# Patient Record
Sex: Female | Born: 1945 | Race: White | Hispanic: No | Smoking: Never smoker
Health system: Southern US, Community
[De-identification: ages and names within clinical notes are randomized; demographics above are authoritative.]

## PROBLEM LIST (undated history)

## (undated) DIAGNOSIS — F319 Bipolar disorder, unspecified: Secondary | ICD-10-CM

## (undated) DIAGNOSIS — M1990 Unspecified osteoarthritis, unspecified site: Secondary | ICD-10-CM

## (undated) DIAGNOSIS — I10 Essential (primary) hypertension: Secondary | ICD-10-CM

## (undated) DIAGNOSIS — M810 Age-related osteoporosis without current pathological fracture: Secondary | ICD-10-CM

## (undated) HISTORY — DX: Essential (primary) hypertension: I10

## (undated) HISTORY — DX: Bipolar disorder, unspecified: F31.9

## (undated) HISTORY — DX: Unspecified osteoarthritis, unspecified site: M19.90

## (undated) HISTORY — PX: FRACTURE SURGERY: SHX138

---

## 2013-03-19 ENCOUNTER — Emergency Department (HOSPITAL_COMMUNITY)
Admission: EM | Admit: 2013-03-19 | Discharge: 2013-03-19 | Disposition: A | Discharge status: Home / Self Care | Payer: Medicare HMO | Attending: Emergency Medicine | Admitting: Emergency Medicine

## 2013-03-19 ENCOUNTER — Encounter (HOSPITAL_COMMUNITY): Payer: Self-pay | Admitting: *Deleted

## 2013-03-19 ENCOUNTER — Emergency Department (HOSPITAL_COMMUNITY): Payer: Medicare HMO

## 2013-03-19 DIAGNOSIS — Z8739 Personal history of other diseases of the musculoskeletal system and connective tissue: Secondary | ICD-10-CM | POA: Insufficient documentation

## 2013-03-19 DIAGNOSIS — M25552 Pain in left hip: Secondary | ICD-10-CM

## 2013-03-19 DIAGNOSIS — M25559 Pain in unspecified hip: Secondary | ICD-10-CM | POA: Insufficient documentation

## 2013-03-19 HISTORY — DX: Age-related osteoporosis without current pathological fracture: M81.0

## 2013-03-19 NOTE — ED Notes (Signed)
Pt states she fell in July and she has had continued pain ever since.

## 2013-03-19 NOTE — ED Provider Notes (Signed)
CSN: [id number]     Arrival date & time 03/19/13  0620 History   First MD Initiated Contact with Patient 03/19/13 0700     Chief Complaint  Patient presents with  . Groin Pain   (Consider location/radiation/quality/duration/timing/severity/associated sxs/prior Treatment) Patient is a 67 y.o. female presenting with groin pain. The history is provided by the patient.  Groin Pain This is a new problem. Episode onset: 2-3 weeks ago. The problem occurs constantly. The problem has not changed since onset.Pertinent negatives include no abdominal pain. The symptoms are aggravated by walking (and sitting). The symptoms are relieved by rest. She has tried rest for the symptoms. The treatment provided mild relief.    Past Medical History  Diagnosis Date  . Osteoporosis    No past surgical history on file. No family history on file. History  Substance Use Topics  . Smoking status: Never Smoker   . Smokeless tobacco: Not on file  . Alcohol Use: No   OB History   Grav Para Term Preterm Abortions TAB SAB Ect Mult Living                 Review of Systems  Constitutional: Negative for fever.  Gastrointestinal: Negative for vomiting and abdominal pain.  Genitourinary: Negative for dysuria and hematuria.  Musculoskeletal: Negative for joint swelling.  Skin: Negative for color change and wound.  Neurological: Negative for weakness and numbness.  All other systems reviewed and are negative.    Allergies  Review of patient's allergies indicates no known allergies.  Home Medications  No current outpatient prescriptions on file. BP 111/96  Pulse 67  Temp(Src) 97.7 F (36.5 C) (Oral)  Resp 20  SpO2 98% Physical Exam  Nursing note and vitals reviewed. Constitutional: She is oriented to person, place, and time. She appears well-developed and well-nourished. No distress.  HENT:  Head: Normocephalic and atraumatic.  Right Ear: External ear normal.  Left Ear: External ear normal.  Nose:  Nose normal.  Eyes: Right eye exhibits no discharge. Left eye exhibits no discharge.  Cardiovascular: Normal rate, regular rhythm and normal heart sounds.   Pulses:      Dorsalis pedis pulses are 2+ on the right side, and 2+ on the left side.  Pulmonary/Chest: Effort normal and breath sounds normal.  Abdominal: Soft. There is no tenderness.  Musculoskeletal:       Left hip: She exhibits tenderness.       Lumbar back: She exhibits no tenderness and no bony tenderness.  Mild pain with ROM of left hip. Rest of MSK exam is neg.  Neurological: She is alert and oriented to person, place, and time. She has normal strength. No sensory deficit.  Skin: Skin is warm and dry.    ED Course  Procedures (including critical care time) Labs Review Labs Reviewed - No data to display Imaging Review Dg Hip Complete Left  03/19/2013   *RADIOLOGY REPORT*  Clinical Data: Left hip pain.  LEFT HIP - COMPLETE 2+ VIEW  Comparison: 12/29/2011 radiographs  Findings: Right hip replacement noted. There is no evidence of acute fracture, subluxation or dislocation. The left hip joint space is unremarkable. No focal bony lesions are present.  IMPRESSION: No acute abnormalities.  Right hip replacement.   Original Report Authenticated By: Harmon Pier, M.D.    MDM   1. Left hip pain     67 year old female presents with gradual two-week history of left groin and back pain. Tarted same time. Only hurts with range of motion  of her hip. No neurologic symptoms. Has a normal neurovascular exam. No urinary or abdominal symptoms. She has had a right hip replaced and this feels similar but less intense. She describes as a 4/10 pain denies wanting any pain meds here. Her main concern is that is broken, x-ray shows no fractures. Is able to ambulate in the ED. Will refer to orthopedics as an outpatient.     Audree Camel, MD 03/19/13 [phone]

## 2014-03-11 IMAGING — CR DG HIP (WITH OR WITHOUT PELVIS) 2-3V*L*
3 series · 3 of 3 positions shown · non-contrast
Comparison: 12/29/2011 radiographs

CLINICAL DATA: Left hip pain.

LEFT HIP - COMPLETE 2+ VIEW

[t pelvis ap]
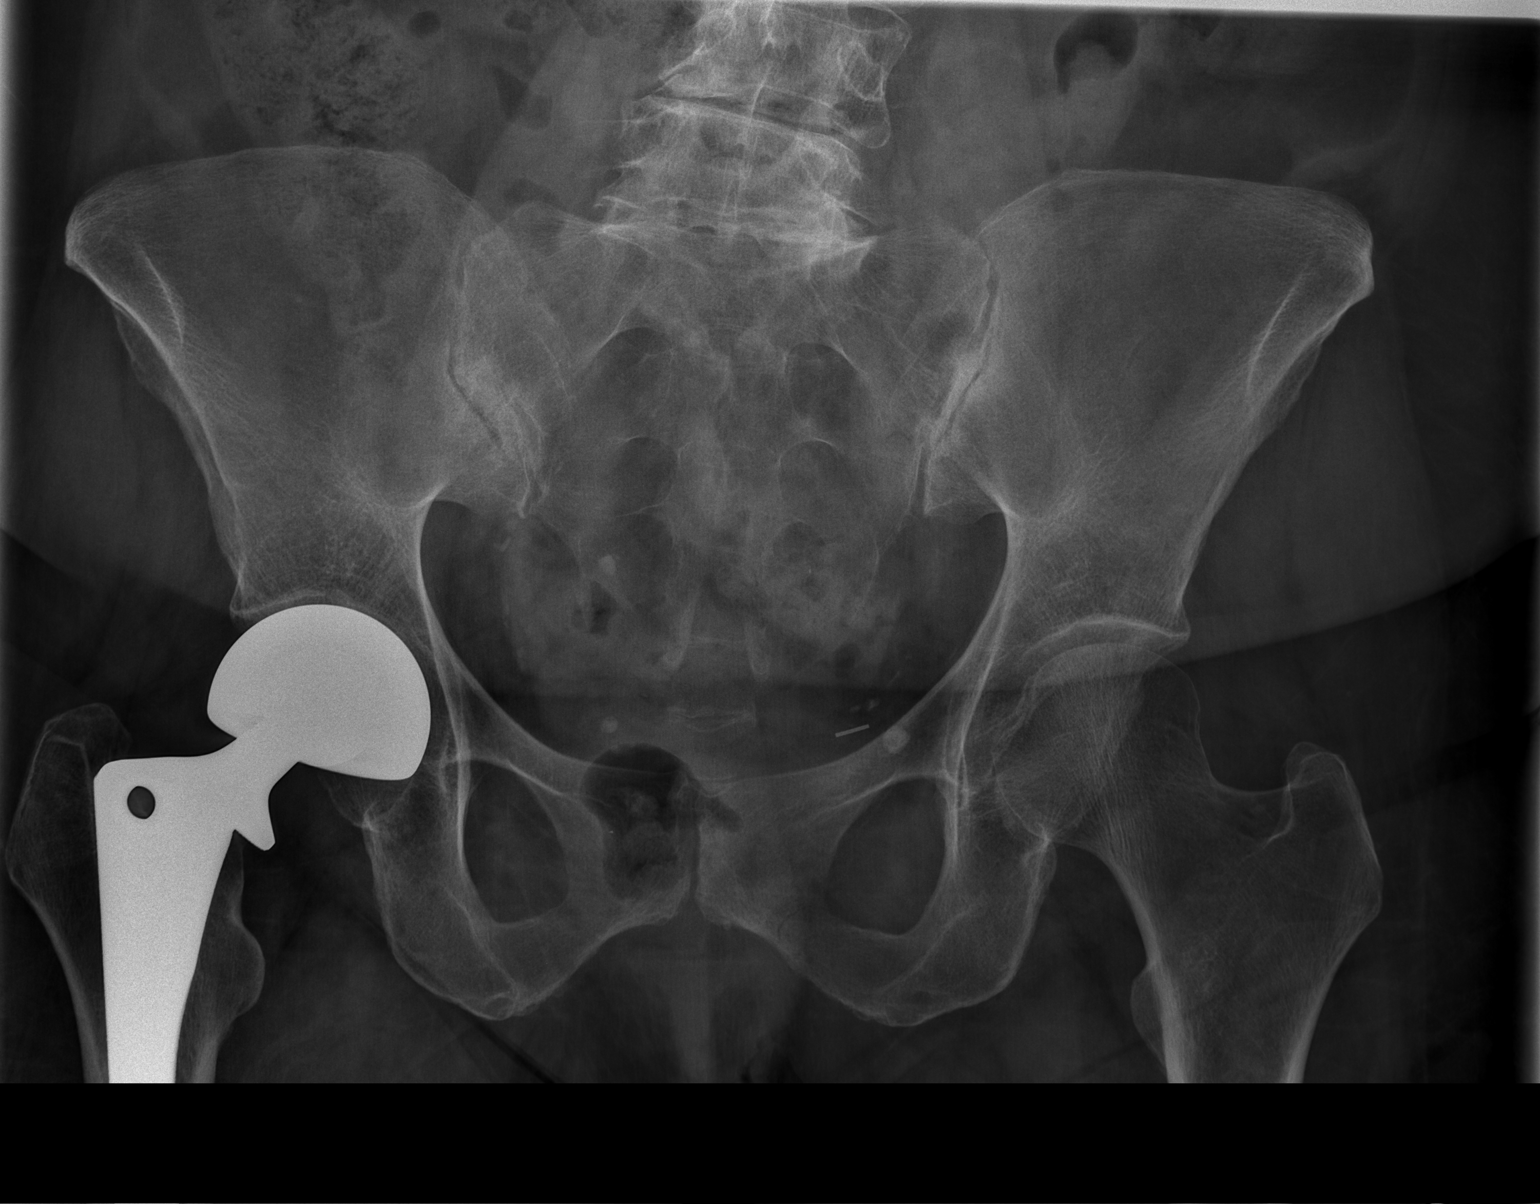

[t hip ap left]
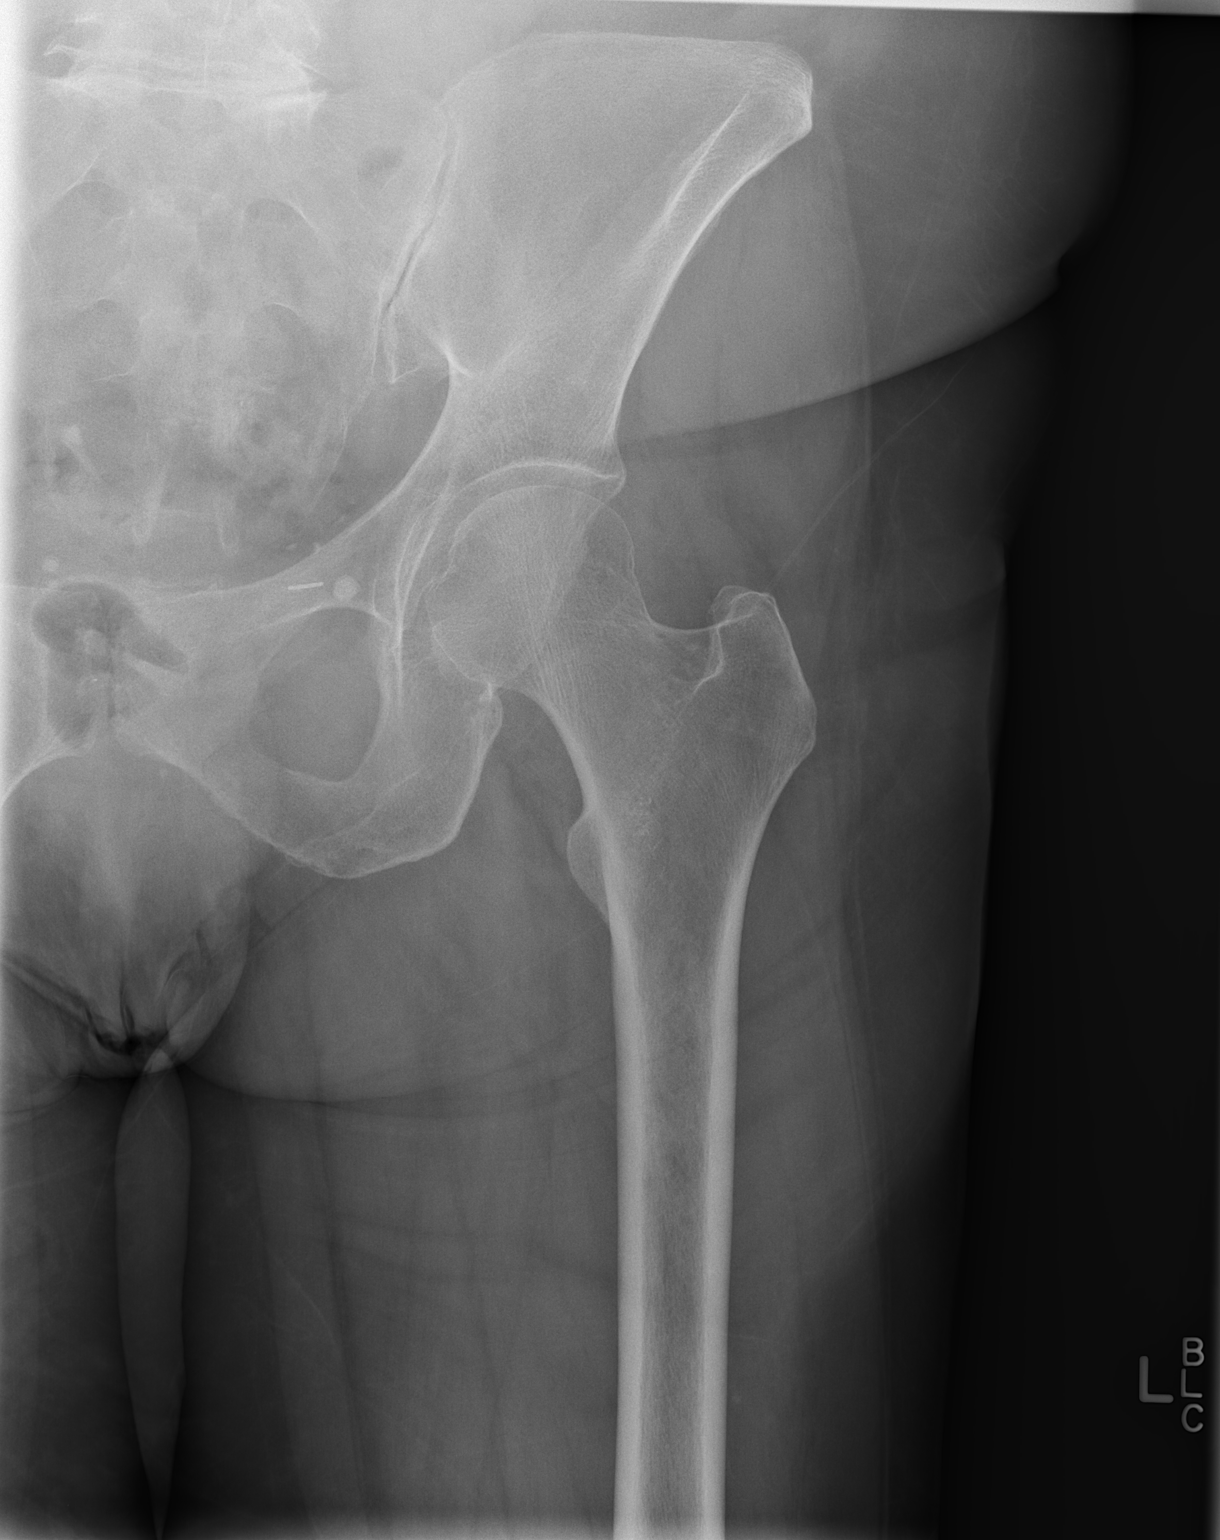

[t hip frog leg left]
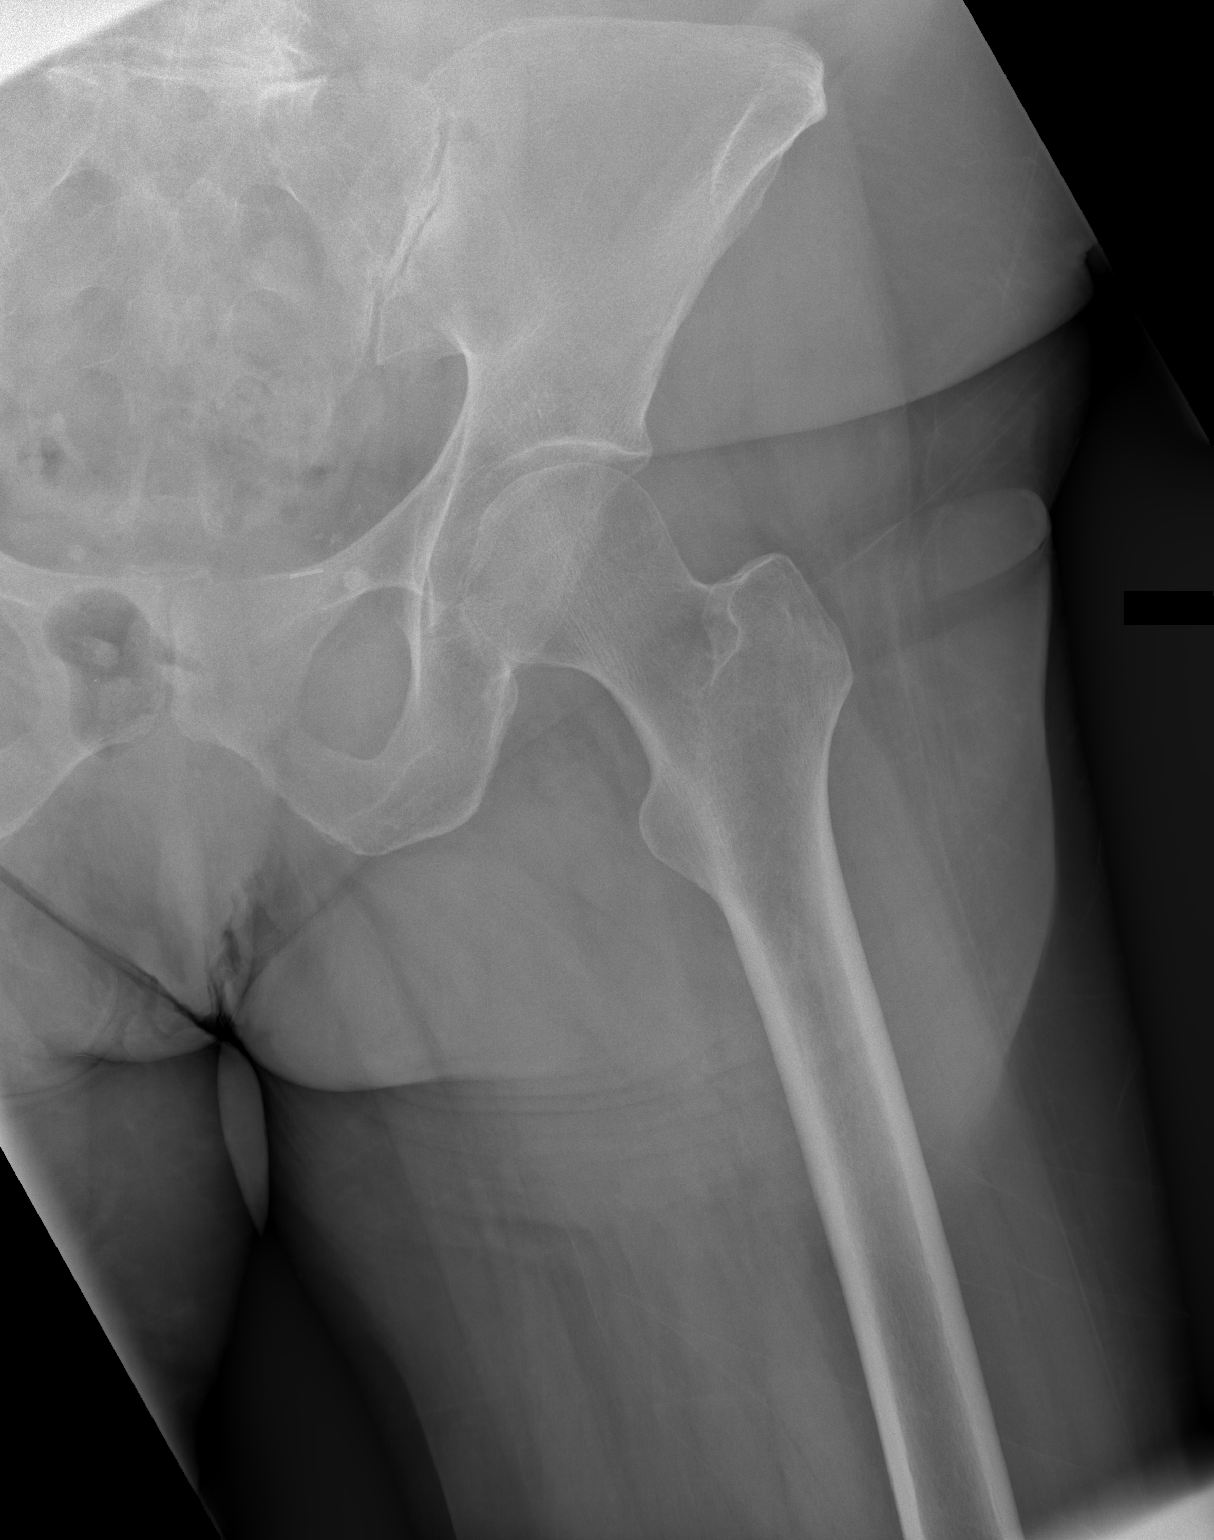

[3 of 3 positions shown; findings below may reference images not displayed]

FINDINGS: Right hip replacement noted.
There is no evidence of acute fracture, subluxation or dislocation.
The left hip joint space is unremarkable.
No focal bony lesions are present.
IMPRESSION: No acute abnormalities.

Right hip replacement.

## 2014-06-01 ENCOUNTER — Ambulatory Visit (INDEPENDENT_AMBULATORY_CARE_PROVIDER_SITE_OTHER): Payer: Commercial Managed Care - HMO

## 2014-06-01 VITALS — BP 146/88 | HR 69 | Resp 12

## 2014-06-01 DIAGNOSIS — B351 Tinea unguium: Secondary | ICD-10-CM

## 2014-06-01 DIAGNOSIS — S90121S Contusion of right lesser toe(s) without damage to nail, sequela: Secondary | ICD-10-CM

## 2014-06-01 DIAGNOSIS — M79674 Pain in right toe(s): Secondary | ICD-10-CM

## 2014-06-01 MED ORDER — EFINACONAZOLE 10 % EX SOLN: CUTANEOUS | Status: DC

## 2014-06-01 NOTE — Patient Instructions (Signed)

## 2014-06-01 NOTE — Progress Notes (Signed)
   Subjective:    Patient ID: Emily Blair, female    DOB: 1946/01/27, 68 y.o.   MRN: [id number]  HPI PT STATED RT FOOT GREAT TOENAIL HAVE DISCOLORATION BUT DOES NOT HURT  FOR 3 WEEKS. THE TOENAILS IS LOOKING WORSE AND TRIED NO TREATMENT.   Review of Systems  All other systems reviewed and are negative.      Objective:   Physical Exam 68 year old white female well-developed well-nourished oriented 3 presents at this time with a dark discolored black right hallux nail with separation from the nailbed the left hallux also systems yellowing and thickening although not as severe. No history of acute injury or trauma the patient recall however it some point may have developed trauma there is separation from the nailbed for nail plate at this time the nail plate is debrided and normal skin completely avulsed slight tenderness of the proximal nail bed is identified following debridement and evaluation. Does not appear be melanoma the pigmentation is within the nail plate which is debrided away. At this time findings are consistent with onychomycosis rather than melanoma. Remainder of exam unremarkable pedal pulses are palpable DP and PT +2 over 4 capillary refill time 3 seconds epicritic and proprioceptive sensations intact and symmetric bilateral there is normal plantar response DTRs not leaving not listed dermatologic the skin color pigment normal hair growth absent there is thickening discoloration darkening both hallux nails right seems worse than left with lysis of the right hallux nail plate being noted. Remainder exam unremarkable no open wounds no ulcers no secondary infections       Assessment & Plan:  Assessment this time onychomycosis right hallux nail plate nailbed plan this time the nails debrided down to the proximal nail fold slight tenderness is noted no open wounds no secondary infections this time her patient request patient is placed empirically on topical antifungal suggested Jublia. A  prescription is provided as well as a coupon card for discount patient will apply once daily to the affected area for 12 months duration as instructed also wash the nail soap and water literature on mycotic nails also dispensed for other care. At this time maintain appropriate accommodative shoes recheck in 6 months for follow-up and reevaluation or reassessment if needed in the future.   Alvan Dameichard Karsyn Jamie DPM

## 2014-06-07 ENCOUNTER — Other Ambulatory Visit: Payer: Self-pay | Admitting: *Deleted

## 2014-06-07 NOTE — Telephone Encounter (Signed)
Insurance would not cover the Jublia.  Dr Ralene CorkSikora changed the prescription to Penlac Nail Lacquer, apply to affected nails every day.  Remove every 7 days with alcohol.  6.6 ml, 5 refills

## 2014-12-14 ENCOUNTER — Ambulatory Visit (INDEPENDENT_AMBULATORY_CARE_PROVIDER_SITE_OTHER): Payer: Commercial Managed Care - HMO | Admitting: Podiatrist

## 2014-12-14 ENCOUNTER — Encounter: Payer: Self-pay | Admitting: Podiatrist

## 2014-12-14 VITALS — BP 135/86 | HR 70 | Resp 18

## 2014-12-14 DIAGNOSIS — B351 Tinea unguium: Secondary | ICD-10-CM | POA: Diagnosis not present

## 2014-12-14 MED ORDER — EFINACONAZOLE 10 % EX SOLN: CUTANEOUS | Status: DC

## 2014-12-15 NOTE — Progress Notes (Signed)
Chief Complaint  Patient presents with  . Nail Problem    i am here to follow back up on my two big toenails and i am still using jublia and i am almost out     HPI: Patient is 69 y.o. female who presents today for follow up of onychomycosis of bilateral great toenails after using jublia. She has noticed improvement   No Known Allergies  Physical Exam  Patient is awake, alert, and oriented x 3.  In no acute distress.  Vascular status is intact with palpable pedal pulses at 2/4 DP and PT bilateral and capillary refill time within normal limits. Neurological sensation is also intact bilaterally via Semmes Weinstein monofilament at 5/5 sites. Light touch, vibratory sensation, Achilles tendon reflex is intact. Dermatological exam reveals skin color, turger and texture as normal. No open lesions present.  Musculature intact with dorsiflexion, plantarflexion, inversion, eversion.  Improved appearance of new nail growth is noted.  Clearing of the proximal nail is seen clinically.    Assessment: onychomycosis of nail  Plan: recommended continued use of jublia for the next 12 months. New rx dispensed for [facility] and coupon given. She will call if the nail fails to improve.

## 2014-12-25 ENCOUNTER — Telehealth: Payer: Self-pay | Admitting: *Deleted

## 2014-12-25 NOTE — Telephone Encounter (Signed)
Faxed request for Jublia from [facility] [address] Denied due to no current evaluation office visit.

## 2016-05-28 ENCOUNTER — Encounter: Payer: Self-pay | Admitting: Sports Medicine

## 2016-05-28 ENCOUNTER — Ambulatory Visit: Payer: Commercial Managed Care - HMO | Admitting: Sports Medicine

## 2016-05-28 ENCOUNTER — Ambulatory Visit (INDEPENDENT_AMBULATORY_CARE_PROVIDER_SITE_OTHER): Payer: Medicare HMO | Admitting: Sports Medicine

## 2016-05-28 DIAGNOSIS — M79674 Pain in right toe(s): Secondary | ICD-10-CM | POA: Diagnosis not present

## 2016-05-28 DIAGNOSIS — L600 Ingrowing nail: Secondary | ICD-10-CM | POA: Diagnosis not present

## 2016-05-28 DIAGNOSIS — M79675 Pain in left toe(s): Secondary | ICD-10-CM

## 2016-05-28 DIAGNOSIS — L603 Nail dystrophy: Secondary | ICD-10-CM

## 2016-05-28 DIAGNOSIS — B351 Tinea unguium: Secondary | ICD-10-CM

## 2016-05-28 NOTE — Progress Notes (Signed)
Subjective: Dorthula PerfectSue D Gotcher is a 70 y.o. female patient presents to office today complaining of a painful thickened big toenails desiring removal and treatment. States that she has tried CongoJub[place] in the past with no improvement.  Patient denies fever/chills/nausea/vomitting/any other related constitutional symptoms at this time.  There are no active problems to display for this patient.   Current Outpatient Prescriptions on File Prior to Visit  Medication Sig Dispense Refill  . alendronate (FOSAMAX) 70 MG tablet     . amoxicillin (AMOXIL) 500 MG capsule     . carbamazepine (TEGRETOL XR) 400 MG 12 hr tablet     . ciclopirox (PENLAC) 8 % solution     . Efinaconazole (JUBLIA) 10 % SOLN Apply to affected nails once daily as instructed for 12 months 8 mL 3  . Efinaconazole (JUBLIA) 10 % SOLN Apply to affected nails once daily as instructed for 12 months 8 mL 3  . FLUZONE HIGH-DOSE 0.5 ML SUSY   0  . lisinopril (PRINIVIL,ZESTRIL) 10 MG tablet     . lisinopril (PRINIVIL,ZESTRIL) 5 MG tablet     . loratadine (CLARITIN) 10 MG tablet   0  . mirtazapine (REMERON) 30 MG tablet     . mometasone (ELOCON) 0.1 % cream   0   No current facility-administered medications on file prior to visit.     No Known Allergies  Objective:  There were no vitals filed for this visit.  General: Well developed, nourished, in no acute distress, alert and oriented x3   Dermatology: Skin is warm, dry and supple bilateral. Bilateral hallux nails are severely thickened and mycotic with a ram's horn appearance with no Erythema. No Edema. No serosanguous drainage present. The remaining nails appear free from acute ingrowing, but are mildly thickened There are no open sores, lesions or other signs of infection  present.  Vascular: Dorsalis Pedis artery 1/4 and Posterior Tibial artery pedal pulses are 2/4 bilateral with immedate capillary fill time. Mild varicosities bilateral. Pedal hair growth present. No lower extremity edema.    Neruologic: Grossly intact via light touch bilateral.  Musculoskeletal: Tenderness to palpation right and left hallux nails. Mild bunion and hammertoe deformity bilateral. Muscular strength within normal limits in all groups bilateral.   Assesement and Plan: Problem List Items Addressed This Visit    None    Visit Diagnoses    Nail dystrophy    -  Primary   Ingrown nail       Onychomycosis       Toe pain, bilateral         -Discussed treatment alternatives and plan of care;Patient opt for temporary removal of both left and right hallux nails with the understanding that nails may grow back, thickened and dystrophic - After a verbal consent, injected 3 ml of a 50:50 mixture of 2% plain  lidocaine and 0.5% plain marcaine in a normal hallux block fashion. Next, a  betadine prep was performed. Anesthesia was tested and found to be appropriate.  The right and left hallux nail in total were then incised from the hyponychium to the epinychium. The Nails were removed and cleared from the field and dressed with antibiotic cream and a dry sterile dressing. -Patient was instructed to leave the dressing intact for today and begin soaking  in a weak solution of betadine and water tomorrow. Patient was instructed to  soak for 15 minutes each day and apply neosporin and a gauze or bandaid dressing each day. -Patient was instructed to  monitor the toe for signs of infection and return to office if toe becomes red, hot or swollen. -Advised ice, elevation, and tylenol or motrin if needed for pain.  -Advised patient to take a break from walking exercise until Monday to prevent irritation of pain at toes -Patient is to return in 2 weeks for follow up care/nail check or sooner if problems arise.  Asencion Islamitorya Dezmen Alcock, DPM

## 2016-05-28 NOTE — Patient Instructions (Signed)

## 2016-06-11 ENCOUNTER — Ambulatory Visit (INDEPENDENT_AMBULATORY_CARE_PROVIDER_SITE_OTHER): Payer: Medicare HMO | Admitting: Sports Medicine

## 2016-06-11 ENCOUNTER — Encounter: Payer: Self-pay | Admitting: Sports Medicine

## 2016-06-11 DIAGNOSIS — B351 Tinea unguium: Secondary | ICD-10-CM

## 2016-06-11 DIAGNOSIS — M79675 Pain in left toe(s): Secondary | ICD-10-CM

## 2016-06-11 DIAGNOSIS — Z9889 Other specified postprocedural states: Secondary | ICD-10-CM

## 2016-06-11 DIAGNOSIS — M79674 Pain in right toe(s): Secondary | ICD-10-CM

## 2016-06-11 NOTE — Patient Instructions (Signed)
Soak with Epsom 1/4 cup to basin of warm water for 1 week or until healed

## 2016-06-11 NOTE — Progress Notes (Signed)
Subjective: Dorthula PerfectSue D Biddle is a 70 y.o. female patient returns to office today for follow up evaluation after having Right and Left Hallux total temporary nail avulsions bilateral performed on 05/28/2016. Patient has been soaking using betadine and applying topical antibiotic covered with bandaid daily. Patient deniesfever/chills/nausea/vomitting/any other related constitutional symptoms at this time.  There are no active problems to display for this patient.   Current Outpatient Prescriptions on File Prior to Visit  Medication Sig Dispense Refill  . alendronate (FOSAMAX) 70 MG tablet     . amoxicillin (AMOXIL) 500 MG capsule     . carbamazepine (TEGRETOL XR) 400 MG 12 hr tablet     . ciclopirox (PENLAC) 8 % solution     . Efinaconazole (JUBLIA) 10 % SOLN Apply to affected nails once daily as instructed for 12 months 8 mL 3  . Efinaconazole (JUBLIA) 10 % SOLN Apply to affected nails once daily as instructed for 12 months 8 mL 3  . FLUZONE HIGH-DOSE 0.5 ML SUSY   0  . lisinopril (PRINIVIL,ZESTRIL) 10 MG tablet     . lisinopril (PRINIVIL,ZESTRIL) 5 MG tablet     . loratadine (CLARITIN) 10 MG tablet   0  . mirtazapine (REMERON) 30 MG tablet     . mometasone (ELOCON) 0.1 % cream   0   No current facility-administered medications on file prior to visit.     No Known Allergies  Objective:  General: Well developed, nourished, in no acute distress, alert and oriented x3   Dermatology: Skin is warm, dry and supple bilateral. Bilateral hallux nail beds appear clean, dry, with healthy granular tissue with minimal scabbing. (-) Erythema. (-) Edema. (-) serosanguous drainage present. The remaining nails appear unremarkable at this time. There are no other lesions or other signs of infection  present.  Neurovascular status: Intact. No lower extremity swelling; No pain with calf compression bilateral.  Musculoskeletal: Decreased tenderness to palpation of the hallux nailbeds. Muscular strength within  normal limits bilateral.   Assesement and Plan: Problem List Items Addressed This Visit    None    Visit Diagnoses    S/P nail surgery    -  Primary   Onychomycosis       Toe pain, bilateral          -Examined patient  -Cleansed Right and left hallux nail bed with peroxide and applied antibiotic cream covered with bandaid.  -Discussed plan of care with patient. -Patient to now begin soaking in a weak solution of Epsom salt and warm water. Patient was instructed to soak for 15-20 minutes each day until the toes appear normal and there is no drainage, redness, tenderness, or swelling at the procedure site, and apply neosporin and a gauze or bandaid dressing each day as needed. May leave open to air at night. -Educated patient on long term care after nail surgery. -Patient was instructed to monitor the toe for reoccurrence and signs of infection; Patient advised to return to office or go to ER if toe becomes red, hot or swollen. -Patient is to return as needed or sooner if problems arise. -Recommended to patient, good skin creams for dry skin Recommend Revitaderm.   Asencion Islamitorya Lori Popowski, DPM

## 2017-12-11 ENCOUNTER — Encounter: Payer: Self-pay | Admitting: Sports Medicine

## 2017-12-11 ENCOUNTER — Ambulatory Visit (INDEPENDENT_AMBULATORY_CARE_PROVIDER_SITE_OTHER): Payer: Medicare HMO | Admitting: Sports Medicine

## 2017-12-11 DIAGNOSIS — L600 Ingrowing nail: Secondary | ICD-10-CM | POA: Diagnosis not present

## 2017-12-11 DIAGNOSIS — M79675 Pain in left toe(s): Secondary | ICD-10-CM

## 2017-12-11 DIAGNOSIS — L603 Nail dystrophy: Secondary | ICD-10-CM

## 2017-12-11 DIAGNOSIS — M79674 Pain in right toe(s): Secondary | ICD-10-CM

## 2017-12-11 DIAGNOSIS — B351 Tinea unguium: Secondary | ICD-10-CM

## 2017-12-11 NOTE — Patient Instructions (Signed)

## 2017-12-11 NOTE — Progress Notes (Signed)
Subjective: Emily Blair is a 72 y.o. female patient presents to office today complaining of a painful thickened big toenails. Patient had avulsion procedures performed that were temporary back in November 2017 and states that slowly her nails have grown back out still thickened and discolored even though she has still continue with over-the-counter antifungals states that her big toes are very tender and she wants both her big toenails gone. Patient denies swelling, redness, or drainage or any other constitutional symptoms at this time.  There are no active problems to display for this patient.   Current Outpatient Medications on File Prior to Visit  Medication Sig Dispense Refill  . alendronate (FOSAMAX) 70 MG tablet     . amoxicillin (AMOXIL) 500 MG capsule     . carbamazepine (TEGRETOL XR) 400 MG 12 hr tablet     . ciclopirox (PENLAC) 8 % solution     . Efinaconazole (JUBLIA) 10 % SOLN Apply to affected nails once daily as instructed for 12 months 8 mL 3  . Efinaconazole (JUBLIA) 10 % SOLN Apply to affected nails once daily as instructed for 12 months 8 mL 3  . FLUZONE HIGH-DOSE 0.5 ML SUSY   0  . lisinopril (PRINIVIL,ZESTRIL) 10 MG tablet     . loratadine (CLARITIN) 10 MG tablet   0  . mirtazapine (REMERON) 30 MG tablet     . mometasone (ELOCON) 0.1 % cream   0   No current facility-administered medications on file prior to visit.     No Known Allergies  Objective:  There were no vitals filed for this visit.  General: Well developed, nourished, in no acute distress, alert and oriented x3   Dermatology: Skin is warm, dry and supple bilateral. Bilateral hallux nails are severely thickened and mycotic with most thickening noted on the left great toenail with no acute ingrowing, no erythema. No Edema. No serosanguous drainage present. The remaining nails appear free from acute ingrowing, but are mildly thickened There are no open sores, lesions or other signs of infection   present.  Vascular: Dorsalis Pedis artery 1/4 and Posterior Tibial artery pedal pulses are 2/4 bilateral with immedate capillary fill time. Mild varicosities bilateral. Pedal hair growth present. No lower extremity edema.   Neruologic: Grossly intact via light touch bilateral.  Musculoskeletal: Tenderness to palpation right and left hallux nails. Mild bunion and hammertoe deformity bilateral. Muscular strength within normal limits in all groups bilateral.   Assesement and Plan: Problem List Items Addressed This Visit    None    Visit Diagnoses    Onychomycosis    -  Primary   Nail dystrophy       Ingrown nail       Toe pain, bilateral         -Discussed treatment alternatives and plan of care;Patient opt for  permanent removal of both left and right hallux nails; consent obtained. - After a verbal consent, injected 3 ml of a 50:50 mixture of 2% plain  lidocaine and 0.5% plain marcaine in a normal hallux block fashion. Next, a  betadine prep was performed. Anesthesia was tested and found to be appropriate.  The right and left hallux nail in total were then incised from the hyponychium to the epinychium. The Nails were removed and cleared from the field and phenol application performed.  The area was then flushed with alcohol and dressed with antibiotic cream and a dry sterile dressing. -Patient was instructed to leave the dressing intact for today  and begin soaking  in a weak solution of betadine and water tomorrow. Patient was instructed to  soak for 15 minutes each day and apply neosporin and a gauze or bandaid dressing each day. -Patient was instructed to monitor the toe for signs of infection and return to office if toe becomes red, hot or swollen. -Advised ice, elevation, and tylenol or motrin if needed for pain.  -Advised patient to take a break from walking exercise for a few days to avoid irritation to the procedure sites or increased pain to toes -Patient is to return in 2  weeks for follow up care/nail check or sooner if problems arise.  Asencion Islam, DPM

## 2017-12-18 ENCOUNTER — Telehealth: Payer: Self-pay | Admitting: Sports Medicine

## 2017-12-18 NOTE — Telephone Encounter (Signed)
Patient is using triple antibiotic for pain relief from [facility] and the description states do not take no longer than a week. What can she do next? If you can call her back at [phone]

## 2017-12-18 NOTE — Telephone Encounter (Signed)
I spoke with pt and asked we she heard not to use the triple antibiotic for over a week. Pt states the ointment box. I told pt those instructions were for people who were not in a doctors care, that Dr. Marylene Land wanted her to continue the soaks and antibiotic bandaids until seen 12/25/2017. Pt states understanding.

## 2017-12-25 ENCOUNTER — Ambulatory Visit (INDEPENDENT_AMBULATORY_CARE_PROVIDER_SITE_OTHER): Payer: Medicare HMO | Admitting: Sports Medicine

## 2017-12-25 ENCOUNTER — Encounter: Payer: Self-pay | Admitting: Sports Medicine

## 2017-12-25 DIAGNOSIS — M79674 Pain in right toe(s): Secondary | ICD-10-CM

## 2017-12-25 DIAGNOSIS — M79675 Pain in left toe(s): Secondary | ICD-10-CM

## 2017-12-25 DIAGNOSIS — Z9889 Other specified postprocedural states: Secondary | ICD-10-CM

## 2017-12-25 NOTE — Progress Notes (Signed)
Subjective: Dorthula PerfectSue D Siebels is a 72 y.o. female patient returns to office today for follow up evaluation after having Right and Left Hallux total permanent nail avulsions bilateral performed on 12-11-17. Patient has been soaking using Epsom salt and applying topical antibiotic covered with bandaid daily. Patient deniesfever/chills/nausea/vomitting/any other related constitutional symptoms at this time.  There are no active problems to display for this patient.   Current Outpatient Medications on File Prior to Visit  Medication Sig Dispense Refill  . alendronate (FOSAMAX) 70 MG tablet     . amoxicillin (AMOXIL) 500 MG capsule     . carbamazepine (TEGRETOL XR) 400 MG 12 hr tablet     . ciclopirox (PENLAC) 8 % solution     . Efinaconazole (JUBLIA) 10 % SOLN Apply to affected nails once daily as instructed for 12 months 8 mL 3  . Efinaconazole (JUBLIA) 10 % SOLN Apply to affected nails once daily as instructed for 12 months 8 mL 3  . FLUZONE HIGH-DOSE 0.5 ML SUSY   0  . lisinopril (PRINIVIL,ZESTRIL) 10 MG tablet     . loratadine (CLARITIN) 10 MG tablet   0  . mirtazapine (REMERON) 30 MG tablet     . mometasone (ELOCON) 0.1 % cream   0   No current facility-administered medications on file prior to visit.     No Known Allergies  Objective:  General: Well developed, nourished, in no acute distress, alert and oriented x3   Dermatology: Skin is warm, dry and supple bilateral. Bilateral hallux nail beds appear clean, dry, with healthy granular tissue with minimal scabbing. (-) Erythema. (-) Edema. (-) serosanguous drainage present. The remaining nails appear unremarkable at this time. There are no other lesions or other signs of infection  present.  Neurovascular status: Intact. No lower extremity swelling; No pain with calf compression bilateral.  Musculoskeletal: Decreased tenderness to palpation of the hallux nailbeds. Muscular strength within normal limits bilateral.   Assesement and  Plan: Problem List Items Addressed This Visit    None    Visit Diagnoses    S/P nail surgery    -  Primary   Toe pain, bilateral          -Examined patient  -Cleansed Right and left hallux nail bed with peroxide and applied antibiotic cream covered with bandaid.  -Discussed plan of care with patient. -Patient to now begin soaking in a weak solution of Epsom salt and warm water once daily for at least 1 more week. Patient was instructed to soak for 15-20 minutes each day until the toes appear normal and there is no drainage, redness, tenderness, or swelling at the procedure site, and apply neosporin and a gauze or bandaid dressing each day as needed. May leave open to air at night. -Educated patient on long term care after nail surgery. -Patient was instructed to monitor the toe for reoccurrence and signs of infection; Patient advised to return to office or go to ER if toe becomes red, hot or swollen. -Patient is to return as needed or sooner if problems arise.  Asencion Islamitorya Tylerjames Hoglund, DPM

## 2017-12-25 NOTE — Patient Instructions (Signed)

## 2023-04-04 DIAGNOSIS — R55 Syncope and collapse: Secondary | ICD-10-CM | POA: Diagnosis not present

## 2023-09-25 ENCOUNTER — Encounter (HOSPITAL_COMMUNITY): Payer: Self-pay | Admitting: Emergency Medicine

## 2023-09-25 ENCOUNTER — Emergency Department (HOSPITAL_COMMUNITY)

## 2023-09-25 ENCOUNTER — Inpatient Hospital Stay (HOSPITAL_COMMUNITY)
Admission: EM | Admit: 2023-09-25 | Discharge: 2023-10-03 | DRG: 871 | Disposition: A | Discharge status: Skilled Nursing Facility | Source: Skilled Nursing Facility | Attending: Internal Medicine | Admitting: Internal Medicine

## 2023-09-25 ENCOUNTER — Inpatient Hospital Stay (HOSPITAL_COMMUNITY)

## 2023-09-25 ENCOUNTER — Other Ambulatory Visit: Payer: Self-pay

## 2023-09-25 ENCOUNTER — Encounter (HOSPITAL_COMMUNITY)

## 2023-09-25 DIAGNOSIS — F03A3 Unspecified dementia, mild, with mood disturbance: Secondary | ICD-10-CM | POA: Diagnosis present

## 2023-09-25 DIAGNOSIS — J9811 Atelectasis: Secondary | ICD-10-CM | POA: Diagnosis present

## 2023-09-25 DIAGNOSIS — F319 Bipolar disorder, unspecified: Secondary | ICD-10-CM | POA: Diagnosis present

## 2023-09-25 DIAGNOSIS — E538 Deficiency of other specified B group vitamins: Secondary | ICD-10-CM | POA: Diagnosis not present

## 2023-09-25 DIAGNOSIS — R410 Disorientation, unspecified: Secondary | ICD-10-CM | POA: Diagnosis not present

## 2023-09-25 DIAGNOSIS — A419 Sepsis, unspecified organism: Secondary | ICD-10-CM | POA: Diagnosis not present

## 2023-09-25 DIAGNOSIS — E785 Hyperlipidemia, unspecified: Secondary | ICD-10-CM | POA: Diagnosis present

## 2023-09-25 DIAGNOSIS — I6612 Occlusion and stenosis of left anterior cerebral artery: Secondary | ICD-10-CM | POA: Diagnosis present

## 2023-09-25 DIAGNOSIS — E871 Hypo-osmolality and hyponatremia: Secondary | ICD-10-CM | POA: Diagnosis present

## 2023-09-25 DIAGNOSIS — K219 Gastro-esophageal reflux disease without esophagitis: Secondary | ICD-10-CM | POA: Diagnosis present

## 2023-09-25 DIAGNOSIS — G039 Meningitis, unspecified: Secondary | ICD-10-CM | POA: Diagnosis present

## 2023-09-25 DIAGNOSIS — J9600 Acute respiratory failure, unspecified whether with hypoxia or hypercapnia: Secondary | ICD-10-CM | POA: Diagnosis present

## 2023-09-25 DIAGNOSIS — M1990 Unspecified osteoarthritis, unspecified site: Secondary | ICD-10-CM | POA: Diagnosis present

## 2023-09-25 DIAGNOSIS — E8809 Other disorders of plasma-protein metabolism, not elsewhere classified: Secondary | ICD-10-CM | POA: Diagnosis present

## 2023-09-25 DIAGNOSIS — Z79899 Other long term (current) drug therapy: Secondary | ICD-10-CM

## 2023-09-25 DIAGNOSIS — R4701 Aphasia: Secondary | ICD-10-CM | POA: Diagnosis present

## 2023-09-25 DIAGNOSIS — S42202D Unspecified fracture of upper end of left humerus, subsequent encounter for fracture with routine healing: Secondary | ICD-10-CM

## 2023-09-25 DIAGNOSIS — J9601 Acute respiratory failure with hypoxia: Secondary | ICD-10-CM | POA: Diagnosis present

## 2023-09-25 DIAGNOSIS — R011 Cardiac murmur, unspecified: Secondary | ICD-10-CM | POA: Diagnosis not present

## 2023-09-25 DIAGNOSIS — D332 Benign neoplasm of brain, unspecified: Secondary | ICD-10-CM | POA: Diagnosis not present

## 2023-09-25 DIAGNOSIS — D320 Benign neoplasm of cerebral meninges: Secondary | ICD-10-CM | POA: Diagnosis present

## 2023-09-25 DIAGNOSIS — R4182 Altered mental status, unspecified: Principal | ICD-10-CM | POA: Diagnosis present

## 2023-09-25 DIAGNOSIS — E876 Hypokalemia: Secondary | ICD-10-CM | POA: Diagnosis present

## 2023-09-25 DIAGNOSIS — I639 Cerebral infarction, unspecified: Secondary | ICD-10-CM

## 2023-09-25 DIAGNOSIS — E872 Acidosis, unspecified: Secondary | ICD-10-CM | POA: Diagnosis present

## 2023-09-25 DIAGNOSIS — F0390 Unspecified dementia without behavioral disturbance: Secondary | ICD-10-CM | POA: Diagnosis not present

## 2023-09-25 DIAGNOSIS — I480 Paroxysmal atrial fibrillation: Secondary | ICD-10-CM | POA: Diagnosis present

## 2023-09-25 DIAGNOSIS — R531 Weakness: Secondary | ICD-10-CM | POA: Diagnosis not present

## 2023-09-25 DIAGNOSIS — G9341 Metabolic encephalopathy: Secondary | ICD-10-CM | POA: Diagnosis present

## 2023-09-25 DIAGNOSIS — I10 Essential (primary) hypertension: Secondary | ICD-10-CM | POA: Diagnosis present

## 2023-09-25 DIAGNOSIS — D649 Anemia, unspecified: Secondary | ICD-10-CM | POA: Diagnosis not present

## 2023-09-25 DIAGNOSIS — M810 Age-related osteoporosis without current pathological fracture: Secondary | ICD-10-CM | POA: Diagnosis present

## 2023-09-25 DIAGNOSIS — R471 Dysarthria and anarthria: Secondary | ICD-10-CM | POA: Diagnosis present

## 2023-09-25 DIAGNOSIS — J189 Pneumonia, unspecified organism: Secondary | ICD-10-CM | POA: Diagnosis present

## 2023-09-25 DIAGNOSIS — R29818 Other symptoms and signs involving the nervous system: Secondary | ICD-10-CM | POA: Diagnosis not present

## 2023-09-25 DIAGNOSIS — R414 Neurologic neglect syndrome: Secondary | ICD-10-CM | POA: Diagnosis present

## 2023-09-25 DIAGNOSIS — J690 Pneumonitis due to inhalation of food and vomit: Secondary | ICD-10-CM | POA: Diagnosis present

## 2023-09-25 DIAGNOSIS — R1310 Dysphagia, unspecified: Secondary | ICD-10-CM | POA: Diagnosis present

## 2023-09-25 DIAGNOSIS — I69354 Hemiplegia and hemiparesis following cerebral infarction affecting left non-dominant side: Secondary | ICD-10-CM | POA: Diagnosis not present

## 2023-09-25 DIAGNOSIS — E7800 Pure hypercholesterolemia, unspecified: Secondary | ICD-10-CM | POA: Diagnosis not present

## 2023-09-25 DIAGNOSIS — R400 Somnolence: Secondary | ICD-10-CM | POA: Diagnosis not present

## 2023-09-25 DIAGNOSIS — Z7983 Long term (current) use of bisphosphonates: Secondary | ICD-10-CM

## 2023-09-25 DIAGNOSIS — W19XXXD Unspecified fall, subsequent encounter: Secondary | ICD-10-CM | POA: Diagnosis present

## 2023-09-25 DIAGNOSIS — G9340 Encephalopathy, unspecified: Secondary | ICD-10-CM | POA: Diagnosis not present

## 2023-09-25 DIAGNOSIS — R569 Unspecified convulsions: Secondary | ICD-10-CM | POA: Diagnosis present

## 2023-09-25 DIAGNOSIS — G8929 Other chronic pain: Secondary | ICD-10-CM | POA: Diagnosis present

## 2023-09-25 DIAGNOSIS — I959 Hypotension, unspecified: Secondary | ICD-10-CM | POA: Diagnosis present

## 2023-09-25 DIAGNOSIS — Z9181 History of falling: Secondary | ICD-10-CM

## 2023-09-25 DIAGNOSIS — R748 Abnormal levels of other serum enzymes: Secondary | ICD-10-CM | POA: Diagnosis present

## 2023-09-25 LAB — DIFFERENTIAL
Abs Immature Granulocytes: 0.03 10*3/uL (ref 0.00–0.07)
Basophils Absolute: 0 10*3/uL (ref 0.0–0.1)
Basophils Relative: 0 %
Eosinophils Absolute: 0 10*3/uL (ref 0.0–0.5)
Eosinophils Relative: 0 %
Immature Granulocytes: 0 %
Lymphocytes Relative: 14 %
Lymphs Abs: 1.5 10*3/uL (ref 0.7–4.0)
Monocytes Absolute: 0.6 10*3/uL (ref 0.1–1.0)
Monocytes Relative: 5 %
Neutro Abs: 8.4 10*3/uL — ABNORMAL HIGH (ref 1.7–7.7)
Neutrophils Relative %: 81 %

## 2023-09-25 LAB — COMPREHENSIVE METABOLIC PANEL
ALT: 12 U/L (ref 0–44)
ALT: 13 U/L (ref 0–44)
AST: 22 U/L (ref 15–41)
AST: 29 U/L (ref 15–41)
Albumin: 4 g/dL (ref 3.5–5.0)
Albumin: 4 g/dL (ref 3.5–5.0)
Alkaline Phosphatase: 198 U/L — ABNORMAL HIGH (ref 38–126)
Alkaline Phosphatase: 191 U/L — ABNORMAL HIGH (ref 38–126)
Anion gap: 13 (ref 5–15)
Anion gap: 15 (ref 5–15)
BUN: 9 mg/dL (ref 8–23)
BUN: 6 mg/dL — ABNORMAL LOW (ref 8–23)
CO2: 23 mmol/L (ref 22–32)
CO2: 22 mmol/L (ref 22–32)
Calcium: 10.2 mg/dL (ref 8.9–10.3)
Calcium: 10.3 mg/dL (ref 8.9–10.3)
Chloride: 92 mmol/L — ABNORMAL LOW (ref 98–111)
Chloride: 95 mmol/L — ABNORMAL LOW (ref 98–111)
Creatinine, Ser: 0.74 mg/dL (ref 0.44–1.00)
Creatinine, Ser: 0.68 mg/dL (ref 0.44–1.00)
GFR, Estimated: >60 mL/min (ref >60)
GFR, Estimated: >60 mL/min (ref >60)
Glucose, Bld: 226 mg/dL — ABNORMAL HIGH (ref 70–99)
Glucose, Bld: 157 mg/dL — ABNORMAL HIGH (ref 70–99)
Potassium: 3.3 mmol/L — ABNORMAL LOW (ref 3.5–5.1)
Potassium: 3.7 mmol/L (ref 3.5–5.1)
Sodium: 128 mmol/L — ABNORMAL LOW (ref 135–145)
Sodium: 132 mmol/L — ABNORMAL LOW (ref 135–145)
Total Bilirubin: 1 mg/dL (ref 0.0–1.2)
Total Bilirubin: 0.9 mg/dL (ref 0.0–1.2)
Total Protein: 7.5 g/dL (ref 6.5–8.1)
Total Protein: 7.6 g/dL (ref 6.5–8.1)

## 2023-09-25 LAB — URINALYSIS, ROUTINE W REFLEX MICROSCOPIC
Bilirubin Urine: NEGATIVE
Glucose, UA: 150 mg/dL — AB
Hgb urine dipstick: NEGATIVE
Ketones, ur: NEGATIVE mg/dL
Nitrite: NEGATIVE
Protein, ur: NEGATIVE mg/dL
Specific Gravity, Urine: 1.018 (ref 1.005–1.030)
pH: 7 (ref 5.0–8.0)

## 2023-09-25 LAB — I-STAT CHEM 8, ED
BUN: 8 mg/dL (ref 8–23)
Calcium, Ion: 1.19 mmol/L (ref 1.15–1.40)
Chloride: 93 mmol/L — ABNORMAL LOW (ref 98–111)
Creatinine, Ser: 0.6 mg/dL (ref 0.44–1.00)
Glucose, Bld: 232 mg/dL — ABNORMAL HIGH (ref 70–99)
HCT: 43 % (ref 36.0–46.0)
Hemoglobin: 14.6 g/dL (ref 12.0–15.0)
Potassium: 3.3 mmol/L — ABNORMAL LOW (ref 3.5–5.1)
Sodium: 129 mmol/L — ABNORMAL LOW (ref 135–145)
TCO2: 26 mmol/L (ref 22–32)

## 2023-09-25 LAB — MENINGITIS/ENCEPHALITIS PANEL (CSF)

## 2023-09-25 LAB — CBC
HCT: 40.2 % (ref 36.0–46.0)
Hemoglobin: 14 g/dL (ref 12.0–15.0)
MCH: 33.1 pg (ref 26.0–34.0)
MCHC: 34.8 g/dL (ref 30.0–36.0)
MCV: 95 fL (ref 80.0–100.0)
Platelets: 367 10*3/uL (ref 150–400)
RBC: 4.23 MIL/uL (ref 3.87–5.11)
RDW: 12.8 % (ref 11.5–15.5)
WBC: 10.6 10*3/uL — ABNORMAL HIGH (ref 4.0–10.5)
nRBC: 0 % (ref 0.0–0.2)

## 2023-09-25 LAB — I-STAT ARTERIAL BLOOD GAS, ED
Acid-Base Excess: 2 mmol/L (ref 0.0–2.0)
Bicarbonate: 25.8 mmol/L (ref 20.0–28.0)
Calcium, Ion: 1.29 mmol/L (ref 1.15–1.40)
HCT: 36 % (ref 36.0–46.0)
Hemoglobin: 12.2 g/dL (ref 12.0–15.0)
O2 Saturation: 99 %
Potassium: 3 mmol/L — ABNORMAL LOW (ref 3.5–5.1)
Sodium: 132 mmol/L — ABNORMAL LOW (ref 135–145)
TCO2: 27 mmol/L (ref 22–32)
pCO2 arterial: 35 mmHg (ref 32–48)
pH, Arterial: 7.475 — ABNORMAL HIGH (ref 7.35–7.45)
pO2, Arterial: 112 mmHg — ABNORMAL HIGH (ref 83–108)

## 2023-09-25 LAB — PROTEIN AND GLUCOSE, CSF
Glucose, CSF: 94 mg/dL — ABNORMAL HIGH (ref 40–70)
Total  Protein, CSF: 43 mg/dL (ref 15–45)

## 2023-09-25 LAB — CSF CELL COUNT WITH DIFFERENTIAL
RBC Count, CSF: 16 /mm3 — ABNORMAL HIGH
RBC Count, CSF: 405 /mm3 — ABNORMAL HIGH
Tube #: 1
Tube #: 4
WBC, CSF: 1 /mm3 (ref 0–5)
WBC, CSF: 2 /mm3 (ref 0–5)

## 2023-09-25 LAB — MRSA NEXT GEN BY PCR, NASAL: MRSA by PCR Next Gen: NOT DETECTED

## 2023-09-25 LAB — I-STAT CG4 LACTIC ACID, ED: Lactic Acid, Venous: 2.2 mmol/L (ref 0.5–1.9)

## 2023-09-25 LAB — PROTIME-INR
INR: 1 (ref 0.8–1.2)
Prothrombin Time: 13.2 s (ref 11.4–15.2)

## 2023-09-25 LAB — GLUCOSE, CAPILLARY
Glucose-Capillary: 122 mg/dL — ABNORMAL HIGH (ref 70–99)
Glucose-Capillary: 128 mg/dL — ABNORMAL HIGH (ref 70–99)
Glucose-Capillary: 144 mg/dL — ABNORMAL HIGH (ref 70–99)
Glucose-Capillary: 156 mg/dL — ABNORMAL HIGH (ref 70–99)

## 2023-09-25 LAB — MAGNESIUM
Magnesium: 1.9 mg/dL (ref 1.7–2.4)
Magnesium: 1.8 mg/dL (ref 1.7–2.4)

## 2023-09-25 LAB — HIV ANTIBODY (ROUTINE TESTING W REFLEX): HIV Screen 4th Generation wRfx: NONREACTIVE

## 2023-09-25 LAB — CBG MONITORING, ED
Glucose-Capillary: 207 mg/dL — ABNORMAL HIGH (ref 70–99)
Glucose-Capillary: 222 mg/dL — ABNORMAL HIGH (ref 70–99)

## 2023-09-25 LAB — PHOSPHORUS
Phosphorus: 2.2 mg/dL — ABNORMAL LOW (ref 2.5–4.6)
Phosphorus: 2.8 mg/dL (ref 2.5–4.6)

## 2023-09-25 LAB — ETHANOL: Alcohol, Ethyl (B): <10 mg/dL (ref <10)

## 2023-09-25 LAB — APTT: aPTT: 24 s (ref 24–36)

## 2023-09-25 MED ORDER — VANCOMYCIN HCL IN DEXTROSE 1-5 GM/200ML-% IV SOLN
1000.0000 mg | Freq: Once | INTRAVENOUS | Status: AC
  Administered 2023-09-25: 1000 mg via INTRAVENOUS
  Filled 2023-09-25: qty 200

## 2023-09-25 MED ORDER — INSULIN ASPART 100 UNIT/ML IJ SOLN: 3.0000 [IU] | INTRAMUSCULAR | Status: DC

## 2023-09-25 MED ORDER — VANCOMYCIN HCL 500 MG/100ML IV SOLN
500.0000 mg | Freq: Once | INTRAVENOUS | Status: AC
  Administered 2023-09-25: 500 mg via INTRAVENOUS
  Filled 2023-09-25: qty 100

## 2023-09-25 MED ORDER — INSULIN ASPART 100 UNIT/ML IJ SOLN
0.0000 [IU] | INTRAMUSCULAR | Status: DC
  Administered 2023-09-25: 3 [IU] via SUBCUTANEOUS
  Administered 2023-09-25 – 2023-09-26 (×3): 2 [IU] via SUBCUTANEOUS
  Administered 2023-09-26: 3 [IU] via SUBCUTANEOUS
  Administered 2023-09-26 – 2023-09-27 (×3): 2 [IU] via SUBCUTANEOUS
  Administered 2023-09-27: 3 [IU] via SUBCUTANEOUS
  Administered 2023-09-27 – 2023-09-28 (×5): 2 [IU] via SUBCUTANEOUS

## 2023-09-25 MED ORDER — FENTANYL CITRATE PF 50 MCG/ML IJ SOSY
25.0000 ug | PREFILLED_SYRINGE | INTRAMUSCULAR | Status: AC | PRN
  Administered 2023-09-25 (×3): 25 ug via INTRAVENOUS
  Filled 2023-09-25 (×4): qty 1

## 2023-09-25 MED ORDER — PROPOFOL 1000 MG/100ML IV EMUL
5.0000 ug/kg/min | INTRAVENOUS | Status: DC
  Administered 2023-09-25: 32 ug/kg/min via INTRAVENOUS
  Administered 2023-09-25: 5 ug/kg/min via INTRAVENOUS
  Administered 2023-09-26 (×2): 40 ug/kg/min via INTRAVENOUS
  Administered 2023-09-26: 35 ug/kg/min via INTRAVENOUS
  Filled 2023-09-25 (×5): qty 100

## 2023-09-25 MED ORDER — FENTANYL CITRATE PF 50 MCG/ML IJ SOSY
25.0000 ug | PREFILLED_SYRINGE | INTRAMUSCULAR | Status: DC | PRN
  Administered 2023-09-25 (×2): 50 ug via INTRAVENOUS
  Administered 2023-09-26: 100 ug via INTRAVENOUS
  Administered 2023-09-26 – 2023-09-27 (×4): 50 ug via INTRAVENOUS
  Filled 2023-09-25: qty 1
  Filled 2023-09-25: qty 2
  Filled 2023-09-25 (×6): qty 1

## 2023-09-25 MED ORDER — LORAZEPAM 2 MG/ML IJ SOLN: 2.0000 mg | Freq: Once | INTRAMUSCULAR | Status: AC

## 2023-09-25 MED ORDER — ORAL CARE MOUTH RINSE: 15.0000 mL | OROMUCOSAL | Status: DC | PRN

## 2023-09-25 MED ORDER — SODIUM CHLORIDE 0.9 % IV BOLUS
1000.0000 mL | Freq: Once | INTRAVENOUS | Status: AC
  Administered 2023-09-25: 1000 mL via INTRAVENOUS

## 2023-09-25 MED ORDER — OSMOLITE 1.5 CAL PO LIQD
1000.0000 mL | ORAL | Status: DC
  Administered 2023-09-26: 1000 mL
  Administered 2023-09-26 (×2): 25 mL/h
  Administered 2023-09-26: 40 mL/h
  Administered 2023-09-27: 1000 mL

## 2023-09-25 MED ORDER — PROSOURCE TF20 ENFIT COMPATIBL EN LIQD
60.0000 mL | Freq: Every day | ENTERAL | Status: DC
  Administered 2023-09-25 – 2023-09-27 (×3): 60 mL
  Filled 2023-09-25 (×3): qty 60

## 2023-09-25 MED ORDER — SODIUM CHLORIDE 0.9 % IV SOLN
2.0000 g | Freq: Four times a day (QID) | INTRAVENOUS | Status: DC
  Filled 2023-09-25: qty 2000

## 2023-09-25 MED ORDER — ENOXAPARIN SODIUM 40 MG/0.4ML IJ SOSY: 40.0000 mg | PREFILLED_SYRINGE | INTRAMUSCULAR | Status: DC

## 2023-09-25 MED ORDER — SODIUM CHLORIDE 0.9 % IV SOLN
2.0000 g | Freq: Once | INTRAVENOUS | Status: AC
  Administered 2023-09-25: 2 g via INTRAVENOUS
  Filled 2023-09-25: qty 20

## 2023-09-25 MED ORDER — IOHEXOL 350 MG/ML SOLN
100.0000 mL | Freq: Once | INTRAVENOUS | Status: AC | PRN
Start: 2023-09-25 — End: 2023-09-25
  Administered 2023-09-25: 100 mL via INTRAVENOUS

## 2023-09-25 MED ORDER — SODIUM CHLORIDE 0.9 % IV SOLN
2.0000 g | Freq: Two times a day (BID) | INTRAVENOUS | Status: DC
  Administered 2023-09-25 – 2023-09-26 (×2): 2 g via INTRAVENOUS
  Filled 2023-09-25 (×2): qty 20

## 2023-09-25 MED ORDER — SODIUM CHLORIDE 0.9 % IV SOLN
2.0000 g | Freq: Once | INTRAVENOUS | Status: AC
  Administered 2023-09-25: 2 g via INTRAVENOUS
  Filled 2023-09-25: qty 2000

## 2023-09-25 MED ORDER — MIDAZOLAM HCL 2 MG/2ML IJ SOLN
1.0000 mg | INTRAMUSCULAR | Status: DC | PRN
  Filled 2023-09-25: qty 2

## 2023-09-25 MED ORDER — VITAL HIGH PROTEIN PO LIQD
1000.0000 mL | ORAL | Status: AC
  Administered 2023-09-25: 1000 mL

## 2023-09-25 MED ORDER — THIAMINE MONONITRATE 100 MG PO TABS
100.0000 mg | ORAL_TABLET | Freq: Every day | ORAL | Status: DC
  Administered 2023-09-25 – 2023-09-27 (×3): 100 mg
  Filled 2023-09-25 (×4): qty 1

## 2023-09-25 MED ORDER — FAMOTIDINE 20 MG PO TABS
20.0000 mg | ORAL_TABLET | Freq: Two times a day (BID) | ORAL | Status: DC
  Administered 2023-09-25 – 2023-09-27 (×6): 20 mg
  Filled 2023-09-25 (×7): qty 1

## 2023-09-25 MED ORDER — SODIUM CHLORIDE 0.9% FLUSH
3.0000 mL | Freq: Once | INTRAVENOUS | Status: AC
  Administered 2023-09-25: 3 mL via INTRAVENOUS

## 2023-09-25 MED ORDER — ORAL CARE MOUTH RINSE
15.0000 mL | OROMUCOSAL | Status: DC
  Administered 2023-09-25 – 2023-09-27 (×27): 15 mL via OROMUCOSAL

## 2023-09-25 MED ORDER — PHENYLEPHRINE 80 MCG/ML (10ML) SYRINGE FOR IV PUSH (FOR BLOOD PRESSURE SUPPORT): 80.0000 ug | PREFILLED_SYRINGE | Freq: Once | INTRAVENOUS | Status: DC | PRN

## 2023-09-25 MED ORDER — POLYETHYLENE GLYCOL 3350 17 G PO PACK
17.0000 g | PACK | Freq: Every day | ORAL | Status: DC
  Administered 2023-09-25 – 2023-09-27 (×3): 17 g
  Filled 2023-09-25 (×4): qty 1

## 2023-09-25 MED ORDER — SODIUM CHLORIDE 0.9 % IV SOLN
2.0000 g | INTRAVENOUS | Status: DC
  Administered 2023-09-25 – 2023-09-26 (×7): 2 g via INTRAVENOUS
  Filled 2023-09-25 (×9): qty 2000

## 2023-09-25 MED ORDER — SODIUM CHLORIDE 0.9 % IV SOLN
INTRAVENOUS | Status: AC | PRN
  Administered 2023-09-25: 1000 mL via INTRAVENOUS

## 2023-09-25 MED ORDER — ALBUTEROL SULFATE (2.5 MG/3ML) 0.083% IN NEBU: 2.5000 mg | INHALATION_SOLUTION | RESPIRATORY_TRACT | Status: DC | PRN

## 2023-09-25 MED ORDER — POTASSIUM CHLORIDE 20 MEQ PO PACK
40.0000 meq | PACK | Freq: Once | ORAL | Status: AC
  Administered 2023-09-25: 40 meq
  Filled 2023-09-25: qty 2

## 2023-09-25 MED ORDER — ETOMIDATE 2 MG/ML IV SOLN
INTRAVENOUS | Status: AC | PRN
  Administered 2023-09-25: 20 mg via INTRAVENOUS

## 2023-09-25 MED ORDER — OSELTAMIVIR PHOSPHATE 6 MG/ML PO SUSR
75.0000 mg | Freq: Two times a day (BID) | ORAL | Status: AC
  Administered 2023-09-25 (×2): 75 mg
  Filled 2023-09-25 (×2): qty 12.5

## 2023-09-25 MED ORDER — CARBAMAZEPINE 100 MG/5ML PO SUSP
200.0000 mg | Freq: Two times a day (BID) | ORAL | Status: DC
  Administered 2023-09-25 – 2023-09-27 (×6): 200 mg
  Filled 2023-09-25 (×11): qty 10

## 2023-09-25 MED ORDER — CHLORHEXIDINE GLUCONATE CLOTH 2 % EX PADS
6.0000 | MEDICATED_PAD | Freq: Every day | CUTANEOUS | Status: DC
  Administered 2023-09-25: 6 via TOPICAL

## 2023-09-25 MED ORDER — LORAZEPAM 2 MG/ML IJ SOLN
INTRAMUSCULAR | Status: AC
  Administered 2023-09-25: 2 mg via INTRAVENOUS
  Filled 2023-09-25: qty 1

## 2023-09-25 MED ORDER — ACETAMINOPHEN 325 MG PO TABS
650.0000 mg | ORAL_TABLET | Freq: Four times a day (QID) | ORAL | Status: DC | PRN
  Administered 2023-09-25 – 2023-09-27 (×4): 650 mg
  Filled 2023-09-25 (×4): qty 2

## 2023-09-25 MED ORDER — GADOBUTROL 1 MMOL/ML IV SOLN
7.0000 mL | Freq: Once | INTRAVENOUS | Status: AC | PRN
  Administered 2023-09-25: 7 mL via INTRAVENOUS

## 2023-09-25 MED ORDER — VANCOMYCIN HCL 750 MG/150ML IV SOLN
750.0000 mg | Freq: Two times a day (BID) | INTRAVENOUS | Status: DC
  Administered 2023-09-25 – 2023-09-26 (×2): 750 mg via INTRAVENOUS
  Filled 2023-09-25 (×3): qty 150

## 2023-09-25 MED ORDER — ENOXAPARIN SODIUM 40 MG/0.4ML IJ SOSY
40.0000 mg | PREFILLED_SYRINGE | Freq: Every day | INTRAMUSCULAR | Status: DC
  Administered 2023-09-25 – 2023-10-02 (×8): 40 mg via SUBCUTANEOUS
  Filled 2023-09-25 (×8): qty 0.4

## 2023-09-25 MED ORDER — LORAZEPAM 2 MG/ML IJ SOLN
INTRAMUSCULAR | Status: AC
  Administered 2023-09-25: 1 mg
  Filled 2023-09-25: qty 1

## 2023-09-25 MED ORDER — SUCCINYLCHOLINE CHLORIDE 20 MG/ML IJ SOLN
INTRAMUSCULAR | Status: AC | PRN
  Administered 2023-09-25: 100 mg via INTRAVENOUS

## 2023-09-25 MED ORDER — DOCUSATE SODIUM 50 MG/5ML PO LIQD
100.0000 mg | Freq: Two times a day (BID) | ORAL | Status: DC
  Administered 2023-09-25 – 2023-09-27 (×5): 100 mg
  Filled 2023-09-25 (×5): qty 10

## 2023-09-25 NOTE — Progress Notes (Signed)
 Pharmacy Antibiotic Note  Emily Blair is a 78 y.o. female admitted on 09/25/2023 with AMS and fall.  Pharmacy has been consulted for vancomycin dosing for rule out meningitis.  Patient is also on ampicillin and Rocephin.  S/p LP.  Renal function at baseline, Tmax 100.9, WBC 10.6, lactate 2.2.  Plan: Continue vanc 750mg  IV Q12H for trough 15-20 mcg/mL Adjust ampicillin to 2gm IV Q4H Rocephin 2gm IV Q12H Monitor renal fxn, micro data, vanc trough if needed  Height: 5\' 8"  (172.7 cm) Weight: 68.5 kg (151 lb 0.2 oz) IBW/kg (Calculated) : 63.9  Temp (24hrs), Avg:100.7 F (38.2 C), Min:98.7 F (37.1 C), Max:101.7 F (38.7 C)  Recent Labs  Lab 09/25/23 0332 09/25/23 0340 09/25/23 0727  WBC 10.6*  --   --   CREATININE 0.74 0.60  --   LATICACIDVEN  --   --  2.2*    Estimated Creatinine Clearance: 59.4 mL/min (by C-G formula based on SCr of 0.6 mg/dL).    No Known Allergies  Vanc 3/7 >> Amp 3/7 >> CTX 3/7 >>    3/7 CSF -  3/7 BCx -   Lazar Tierce D. Laney Potash, PharmD, BCPS, BCCCP 09/25/2023, 10:23 AM

## 2023-09-25 NOTE — ED Notes (Signed)
 Patient transported to CT

## 2023-09-25 NOTE — Code Documentation (Signed)
 Responded to Code Stroke called at 0321 for L sided weakness, R gaze, and aphasia, LSN-1500 yesterday. Pt arrived at 0330, CBG-207, NIH-20, CT head negative for acute changes. CTA/CTP-no LVO. TNK not given-pt outside window for intervention. Plan: MRI, LP. Please complete VS/neuro checks q2h x 12, then q4h.

## 2023-09-25 NOTE — ED Provider Notes (Addendum)
 [place] [facility] AT [facility] Provider Note   CSN: [id number] Arrival date & time: 09/25/23  0330  An emergency department physician performed an initial assessment on this suspected stroke patient at 0330.  History  Chief Complaint  Patient presents with   Altered Mental Status   Fall    Emily Blair is a 78 y.o. female.  The history is provided by the EMS personnel and medical records.  Altered Mental Status Fall  Emily Blair is a 78 y.o. female who presents to the Emergency Department complaining of code stroke.  She presents to the emergency department by EMS as a code stroke.  She is currently in an assisted living facility for rehabilitation and was noted to be confused at 3 PM and not at her baseline.  Nursing found her tonight on the floor.  She had left-sided weakness and EMS was called.  Code stroke activated prior to ED arrival.  Earlier in the shift around 3 PM there was concern that she had a UTI with plan to check a urine. She did have vomiting at the facility.    Home Medications Prior to Admission medications   Medication Sig Start Date End Date Taking? Authorizing Provider  carbamazepine (TEGRETOL XR) 400 MG 12 hr tablet Take 400 mg by mouth at bedtime. 05/14/14  Yes [provider]  lisinopril (ZESTRIL) 20 MG tablet Take 20 mg by mouth daily. 12/13/14  Yes [provider]  alendronate (FOSAMAX) 70 MG tablet  05/30/14   [provider]  amoxicillin (AMOXIL) 500 MG capsule  04/26/14   [provider]  ciclopirox (PENLAC) 8 % solution  10/03/14   [provider]  Efinaconazole (JUBLIA) 10 % SOLN Apply to affected nails once daily as instructed for 12 months 12/14/14   Delories Heinz, DPM  Efinaconazole (JUBLIA) 10 % SOLN Apply to affected nails once daily as instructed for 12 months 12/14/14   Delories Heinz, DPM  FLUZONE HIGH-DOSE 0.5 ML SUSY  04/24/14   [provider]  loratadine  (CLARITIN) 10 MG tablet  09/12/14   [provider]  mirtazapine (REMERON) 30 MG tablet  05/14/14   [provider]  mometasone (ELOCON) 0.1 % cream  11/15/14   [provider]      Allergies    Patient has no known allergies.    Review of Systems   Review of Systems  All other systems reviewed and are negative.   Physical Exam Updated Vital Signs BP (!) 147/109   Pulse (!) 137   Temp 98.7 F (37.1 C)   Resp (!) 25   Wt 68.5 kg   SpO2 97%  Physical Exam Vitals and nursing note reviewed.  Constitutional:      Appearance: She is well-developed.     Comments: drowsy  HENT:     Head: Normocephalic and atraumatic.  Cardiovascular:     Rate and Rhythm: Regular rhythm. Tachycardia present.     Heart sounds: No murmur heard. Pulmonary:     Effort: Pulmonary effort is normal. No respiratory distress.     Breath sounds: Normal breath sounds.  Abdominal:     Palpations: Abdomen is soft.     Tenderness: There is no abdominal tenderness. There is no guarding or rebound.  Musculoskeletal:        General: No tenderness.  Skin:    General: Skin is warm and dry.  Neurological:     Comments: Drowsy.  Left  hemineglect.  Fine tremor to the right arm.  Unintelligible speech.  Psychiatric:        Behavior: Behavior normal.     ED Results / Procedures / Treatments   Labs (all labs ordered are listed, but only abnormal results are displayed) Labs Reviewed  CBC - Abnormal; Notable for the following components:      Result Value   WBC 10.6 (*)    All other components within normal limits  DIFFERENTIAL - Abnormal; Notable for the following components:   Neutro Abs 8.4 (*)    All other components within normal limits  COMPREHENSIVE METABOLIC PANEL - Abnormal; Notable for the following components:   Sodium 128 (*)    Potassium 3.3 (*)    Chloride 92 (*)    Glucose, Bld 226 (*)    Alkaline Phosphatase 198 (*)    All other components within normal limits   URINALYSIS, ROUTINE W REFLEX MICROSCOPIC - Abnormal; Notable for the following components:   Glucose, UA 150 (*)    Leukocytes,Ua TRACE (*)    Bacteria, UA RARE (*)    All other components within normal limits  I-STAT CHEM 8, ED - Abnormal; Notable for the following components:   Sodium 129 (*)    Potassium 3.3 (*)    Chloride 93 (*)    Glucose, Bld 232 (*)    All other components within normal limits  CBG MONITORING, ED - Abnormal; Notable for the following components:   Glucose-Capillary 207 (*)    All other components within normal limits  I-STAT CG4 LACTIC ACID, ED - Abnormal; Notable for the following components:   Lactic Acid, Venous 2.2 (*)    All other components within normal limits  CBG MONITORING, ED - Abnormal; Notable for the following components:   Glucose-Capillary 222 (*)    All other components within normal limits  CULTURE, BLOOD (ROUTINE X 2)  CULTURE, BLOOD (ROUTINE X 2)  PROTIME-INR  APTT  ETHANOL    EKG EKG Interpretation Date/Time:  Friday September 25 2023 05:52:47 EST Ventricular Rate:  137 PR Interval:  178 QRS Duration:  80 QT Interval:  338 QTC Calculation: 510 R Axis:   6  Text Interpretation: Sinus tachycardia Left ventricular hypertrophy with repolarization abnormality ( R in aVL , Cornell product ) Abnormal ECG Confirmed by Tilden Fossa [phone]) on 09/25/2023 5:57:34 AM  Radiology MR BRAIN W WO CONTRAST Result Date: 09/25/2023 CLINICAL DATA:  78 year old female code stroke presentation. Left side weakness reported. EXAM: MRI HEAD WITHOUT AND WITH CONTRAST TECHNIQUE: Multiplanar, multiecho pulse sequences of the brain and surrounding structures were obtained without and with intravenous contrast. CONTRAST:  7mL GADAVIST GADOBUTROL 1 MMOL/ML IV SOLN COMPARISON:  CT head, CTA, CTP this morning. Previous brain MRI 02/27/2023. FINDINGS: Brain: DWI is mildly motion degraded. No convincing diffusion restriction, acute infarct. Choroid plexus cysts,  normal variant and stable. Stable cerebral volume. Nomidline shift, mass effect, ventriculomegaly, or acute intracranial hemorrhage. Cervicomedullary junction and pituitary are within normal limits. Left quadrigeminal plate extra-axial up to 1.2 cm lesion with mixed signal again noted, fluid fluid level. This is stable, benign, and without regional brain edema. No enhancement following contrast. Stable gray and white matter signal throughout the brain. Patchy mild to moderate for age cerebral white matter T2 and FLAIR hyperintensity. Mild chronic T2 heterogeneity in the deep gray matter nuclei, pons. No cortical encephalomalacia. However, there is possibly a chronic microhemorrhage in the right pons on SWI (series 7, image 30), not apparent  on T2* previously. No abnormal enhancement identified. No dural thickening. Vascular: Major intracranial vascular flow voids are preserved. Following contrast major dural venous sinuses are enhancing and appear to be patent. Skull and upper cervical spine: Stable. Congenital incomplete segmentation of C2-C3. Visualized bone marrow signal is within normal limits. Sinuses/Orbits: Stable. Other: Mild left mastoid effusion is stable. Negative nasopharynx aside from trace retained secretions. Other visible internal auditory structures appear grossly normal. IMPRESSION: 1. No acute intracranial abnormality. 2. Mild to moderate for age signal changes of chronic small vessel disease. 3. 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 Electronically Signed   By: Odessa Fleming M.D.   On: 09/25/2023 06:17   CT ANGIO HEAD NECK W WO CM W PERF (CODE STROKE) Result Date: 09/25/2023 CLINICAL DATA:  78 year old female code stroke presentation. Left side weakness. EXAM: CT ANGIOGRAPHY HEAD AND NECK CT PERFUSION BRAIN TECHNIQUE: Multidetector CT imaging of the head and neck was performed using the standard protocol during bolus administration  of intravenous contrast. Multiplanar CT image reconstructions and MIPs were obtained to evaluate the vascular anatomy. Carotid stenosis measurements (when applicable) are obtained utilizing NASCET criteria, using the distal internal carotid diameter as the denominator. Multiphase CT imaging of the brain was performed following IV bolus contrast injection. Subsequent parametric perfusion maps were calculated using RAPID software. RADIATION DOSE REDUCTION: This exam was performed according to the departmental dose-optimization program which includes automated exposure control, adjustment of the mA and/or kV according to patient size and/or use of iterative reconstruction technique. CONTRAST:  OMNIPAQUE IOHEXOL 350 MG/ML SOLN COMPARISON:  Plain head CT 0344 hours today. FINDINGS: CT Brain Perfusion Findings: ASPECTS: 10 Motion degraded. CBF (<30%) Volume: 0mL Perfusion (Tmax>6.0s) volume: 0mL Mismatch Volume: Not applicable Infarction Location:Not applicable CTA NECK Skeleton: Cervical spine degeneration superimposed on congenital appearing incomplete segmentation of C2-C3. Spondylolisthesis, scoliosis. No acute osseous abnormality identified. Upper chest: Negative. Other neck: Neck soft tissue spaces appear within normal limits. Aortic arch: Tortuous 3 vessel arch configuration. Minimal arch atherosclerosis. Right carotid system: Tortuous brachiocephalic artery without plaque or stenosis. Negative right CCA. Minimal plaque at the right carotid bifurcation. Mild right ICA tortuosity without stenosis. Left carotid system: Similar mild plaque and tortuosity with no significant stenosis. Vertebral arteries: Tortuous proximal right subclavian artery with normal right vertebral artery origin. Tortuous right V1 segment. Mildly dominant right vertebral artery with tortuosity is patent to the skull base with no significant plaque or stenosis. Mildly tortuous proximal left subclavian artery with no plaque. Normal left  vertebral artery origin. Mildly non dominant but normal size left vertebral artery is patent to the skull base with no significant plaque or stenosis. CTA HEAD Posterior circulation: Patent, mildly tortuous distal vertebral arteries and vertebrobasilar junction. Mild right V4 calcified plaque without stenosis. Patent PICA origins. Patent basilar artery without stenosis. Patent SCA and PCA origins. Posterior communicating arteries are diminutive or absent. Mildly ectatic basilar artery. Bilateral PCA branches are patent with mild irregularity. Anterior circulation: Both ICA siphons are patent. Both siphons are tortuous. Mild calcified plaque affecting the cavernous segments and anterior genu bilaterally. No siphon stenosis. Patent carotid termini. Normal MCA and ACA origins. Normal anterior communicating artery. Moderate stenosis left ACA A2 segment (series 12, image 38). Other bilateral ACA branches are within normal limits. Left MCA M1 segment bifurcates early without stenosis. Right MCA M1 segment is tortuous and bifurcates without stenosis. Bilateral MCA branches are within normal limits. Venous sinuses: Early contrast timing, grossly  patent. Anatomic variants: Mildly dominant right vertebral artery. Review of the MIP images confirms the above findings IMPRESSION: 1. CTA is negative for large vessel occlusion. Negative motion degraded CT Perfusion. 2. Generalized vessel tortuosity with mild for age atherosclerosis in the head and neck. Although Moderate stenosis of the Left ACA A2 segment. No other significant arterial stenosis. Study discussed by telephone with Dr. Caryl Pina on 09/25/2023 at 04:12 . Electronically Signed   By: Odessa Fleming M.D.   On: 09/25/2023 04:12   CT HEAD CODE STROKE WO CONTRAST Result Date: 09/25/2023 CLINICAL DATA:  Code stroke.  Neuro deficit, acute, stroke suspected EXAM: CT HEAD WITHOUT CONTRAST TECHNIQUE: Contiguous axial images were obtained from the base of the skull through the  vertex without intravenous contrast. RADIATION DOSE REDUCTION: This exam was performed according to the departmental dose-optimization program which includes automated exposure control, adjustment of the mA and/or kV according to patient size and/or use of iterative reconstruction technique. COMPARISON:  CT head May 10, 2023. FINDINGS: Motion limited study.  Within this limitation: Brain: No evidence of acute large vascular territory infarction, hemorrhage, hydrocephalus, extra-axial collection or midline shift. 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 Vascular: Calcific atherosclerosis. No hyperdense vessel identified. Skull: No acute fracture. Sinuses/Orbits: Clear sinuses.  No acute orbital findings. Other: No mastoid effusions. ASPECTS [facility] Stroke Program Early CT Score)Total score (0-10 with 10 being normal): 10. IMPRESSION: 1. No evidence of acute intracranial abnormality on this motion limited study. ASPECTS is 10. 2. 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 Code stroke imaging results were communicated on 09/25/2023 at 3:52 am to provider Dr. Otelia Limes via secure text paging. Electronically Signed   By: Feliberto Harts M.D.   On: 09/25/2023 03:53    Procedures Procedure Name: Intubation Date/Time: 09/25/2023 7:40 AM  Performed by: Tilden Fossa, MDPre-anesthesia Checklist: Patient identified, Patient being monitored, Emergency Drugs available, Timeout performed and Suction available Oxygen Delivery Method: Non-rebreather mask Preoxygenation: Pre-oxygenation with 100% oxygen Induction Type: Rapid sequence Ventilation: Mask ventilation without difficulty Laryngoscope Size: Glidescope Tube size: 7.5 mm Number of attempts: 1 Airway Equipment and Method:  Video-laryngoscopy Placement Confirmation: ETT inserted through vocal cords under direct vision, CO2 detector and Breath sounds checked- equal and bilateral Secured at: 25 cm Tube secured with: ETT holder Dental Injury: Teeth and Oropharynx as per pre-operative assessment       CRITICAL CARE Performed by: Tilden Fossa   Total critical care time: 40 minutes  Critical care time was exclusive of separately billable procedures and treating other patients.  Critical care was necessary to treat or prevent imminent or life-threatening deterioration.  Critical care was time spent personally by me on the following activities: development of treatment plan with patient and/or surrogate as well as nursing, discussions with consultants, evaluation of patient's response to treatment, examination of patient, obtaining history from patient or surrogate, ordering and performing treatments and interventions, ordering and review of laboratory studies, ordering and review of radiographic studies, pulse oximetry and re-evaluation of patient's condition.   Medications Ordered in ED Medications  sodium chloride 0.9 % bolus 1,000 mL (has no administration in time range)  PHENYLephrine 80 mcg/ml in normal saline Adult IV Push Syringe (For Blood Pressure Support) (has no administration in time range)  0.9 %  sodium  chloride infusion (1,000 mLs Intravenous New Bag/Given 09/25/23 0720)  docusate (COLACE) 50 MG/5ML liquid 100 mg (has no administration in time range)  polyethylene glycol (MIRALAX / GLYCOLAX) packet 17 g (has no administration in time range)  midazolam (VERSED) injection 1-2 mg (has no administration in time range)  fentaNYL (SUBLIMAZE) injection 25 mcg (has no administration in time range)  fentaNYL (SUBLIMAZE) injection 25-100 mcg (has no administration in time range)  etomidate (AMIDATE) injection (20 mg Intravenous Given 09/25/23 0725)  succinylcholine (ANECTINE) injection (100 mg Intravenous  Given 09/25/23 0726)  cefTRIAXone (ROCEPHIN) 2 g in sodium chloride 0.9 % 100 mL IVPB (has no administration in time range)  vancomycin (VANCOCIN) IVPB 1000 mg/200 mL premix (has no administration in time range)  ampicillin (OMNIPEN) 2 g in sodium chloride 0.9 % 100 mL IVPB (has no administration in time range)  sodium chloride flush (NS) 0.9 % injection 3 mL (3 mLs Intravenous Given 09/25/23 0603)  iohexol (OMNIPAQUE) 350 MG/ML injection 100 mL (100 mLs Intravenous Contrast Given 09/25/23 0357)  LORazepam (ATIVAN) injection 2 mg (2 mg Intravenous Given 09/25/23 0441)  gadobutrol (GADAVIST) 1 MMOL/ML injection 7 mL (7 mLs Intravenous Contrast Given 09/25/23 0550)  LORazepam (ATIVAN) 2 MG/ML injection (1 mg  Given 09/25/23 5366)    ED Course/ Medical Decision Making/ A&P                                 Medical Decision Making Amount and/or Complexity of Data Reviewed Labs: ordered. Radiology: ordered.  Risk OTC drugs. Prescription drug management. Decision regarding hospitalization.   Patient presents by EMS as a code stroke for left-sided deficits.  Last known well is unclear as patient was not at her baseline at 3 PM but she was interactive and conversant at that time.  No external evidence of trauma on examination.  She was evaluated by neurology with CT head, CTA and MRI, which are negative for acute process.  Plan for EEG to further evaluate her symptoms.  While patient was getting ready for EEG she became less responsive with transient hypotension to the 60s.  On repeat evaluation patient is a GCS of 3 with some mild spontaneous movement to the right upper extremity but no response to pain.  Eyes were roving.  Concern for airway protection and RSI performed.  She was also treated with lorazepam for possible seizure activity.  Blood pressure did improve with IV fluid bolus.  Will add on lactate, blood cultures.  Critical care consulted for ongoing workup and admission.        Final Clinical  Impression(s) / ED Diagnoses Final diagnoses:  Altered mental status, unspecified altered mental status type    Rx / DC Orders ED Discharge Orders     None         Tilden Fossa, MD 09/25/23 4403    Tilden Fossa, MD 10/09/23 1123

## 2023-09-25 NOTE — Consult Note (Addendum)
 NEUROLOGY CONSULT NOTE   Date of service: September 25, 2023 Patient Name: Emily Blair MRN:  [id number] DOB:  [birth year] Chief Complaint: Left sided weakness, aphasia and left hemineglect Requesting Provider: Tilden Fossa, MD  History of Present Illness  Emily Blair is a 78 y.o. female with a PMHx of arthritis, bipolar disorder, HTN, recent humerus fracture, falls and osteoporosis who presents from her combined assisted living/skilled nursing facility via EMS for acute onset of left sided weakness, aphasia and left hemineglect. LKN was sometime during the 3 - 11 PM shift of her nurse on Thursday. The RN had stated during 11 PM sign out that the patient had become confused sometime during her shift and was not behaving normally. The patient was brought to her room, which she shares with a roommate, and went to bed. She was later found on the floor at the side of her bed at 2 AM with worsened confusion. Over the next 30 minutes she continued to worsen and EMS was then called. On EMS arrival, they noted left sided weakness, aphasia and left hemineglect as well as intermittent coarse right upper extremity rest tremor. She was essentially mute with incomprehensible moaning and was not responding to commands. Vitals per EMS: 200/110, HR 130, O2 sats 98%.  At her baseline she ambulates independently, is conversant and able to eat on her own, bathe and dress herself. Staff at her facility is not aware of any history of dementia or dementia-like symptoms.   Home medications include carbamazepine which per Pharmacy is most likely for mood control. No listed history of seizures.   LKW: Between 3-11 PM on Thursday Modified rankin score: 0-Completely asymptomatic and back to baseline post- stroke IV Thrombolysis: No: Out of the time window EVT: No: CTA negative for LVO   NIHSS components Score: Comment  1a Level of Conscious 0[]  1[]  2[x]  3[]      1b LOC Questions 0[]  1[]  2[x]       1c LOC Commands 0[]  1[]  2[x]        2 Best Gaze 0[]  1[x]  2[]       3 Visual 0[]  1[]  2[x]  3[]      4 Facial Palsy 0[]  1[]  2[x]  3[]      5a Motor Arm - left 0[]  1[]  2[]  3[x]  4[]  UN[]    5b Motor Arm - Right 0[x]  1[]  2[]  3[]  4[]  UN[]    6a Motor Leg - Left 0[]  1[x]  2[]  3[]  4[]  UN[]    6b Motor Leg - Right 0[x]  1[]  2[]  3[]  4[]  UN[]    7 Limb Ataxia 0[x]  1[]  2[]  3[]  UN[]     8 Sensory 0[]  1[x]  2[]  UN[]      9 Best Language 0[]  1[]  2[x]  3[]      10 Dysarthria 0[]  1[x]  2[]  UN[]      11 Extinct. and Inattention 0[]  1[]  2[x]       TOTAL:   21      ROS  Unable to ascertain due to AMS  Past History   Past Medical History:  Diagnosis Date   Arthritis    Bipolar 1 disorder (HCC)    Hypertension    Osteoporosis    No past surgical history on file.  Family History: No family history on file.  Social History  reports that she has never smoked. She does not have any smokeless tobacco history on file. She reports that she does not drink alcohol. No history on file for drug use.  No Known Allergies  Medications   Current Facility-Administered Medications:    sodium  chloride flush (NS) 0.9 % injection 3 mL, 3 mL, Intravenous, Once, Tilden Fossa, MD  Current Outpatient Medications:    alendronate (FOSAMAX) 70 MG tablet, , Disp: , Rfl:    amoxicillin (AMOXIL) 500 MG capsule, , Disp: , Rfl:    carbamazepine (TEGRETOL XR) 400 MG 12 hr tablet, , Disp: , Rfl:    ciclopirox (PENLAC) 8 % solution, , Disp: , Rfl:    Efinaconazole (JUBLIA) 10 % SOLN, Apply to affected nails once daily as instructed for 12 months, Disp: 8 mL, Rfl: 3   Efinaconazole (JUBLIA) 10 % SOLN, Apply to affected nails once daily as instructed for 12 months, Disp: 8 mL, Rfl: 3   FLUZONE HIGH-DOSE 0.5 ML SUSY, , Disp: , Rfl: 0   lisinopril (PRINIVIL,ZESTRIL) 10 MG tablet, , Disp: , Rfl:    loratadine (CLARITIN) 10 MG tablet, , Disp: , Rfl: 0   mirtazapine (REMERON) 30 MG tablet, , Disp: , Rfl:    mometasone (ELOCON) 0.1 % cream, , Disp: , Rfl: 0  Vitals    Vitals:   09/25/23 0300  Weight: 68.5 kg    There is no height or weight on file to calculate BMI.  Physical Exam   Physical Exam  HEENT:  [place]/AT Lungs: Respirations unlabored Extremities: Warm and well perfused.   Neurological Examination Mental Status: Awake with decreased level of alertness and dense left hemineglect. Only verbal output initially was moaning sounds. Not following any commands. Thrashes occasionally in apparent discomfort stating with slurred barely intelligible speech "I need to pee". This is the only intelligible speech output noted throughout the exam. Dense left hemineglect.  Cranial Nerves: II: No blink to threat in left hemifield. Consistently blinks briskly to threat to right hemifield. PERRL.    III,IV, VI: No ptosis. Rightward gaze deviation and cannot track to the left past midline. No nystagmus.  V: Reacts to touch briskly on the right but with decreased sensation on the left.  VII: Left facial droop.  VIII: Unable to assess IX,X: Gag reflex deferred XI: Head preferentially rotated to the right XII: Does not protrude tongue to command Motor/Sensory: RUE: Moves antigravity towards chest in response to sternal rub and thrashes intermittently. Does not follow commands for formal testing. Reacts briskly to pinch LUE: Initially flaccid without movement. After CT, she will withdraw with 2/5 strength nonpurposefully to pinch RLE withdraws briskly and moves antigravity to noxious plantar stimulation LLE withdraws briskly but less so than on right to noxious stimulation. Able to lift antigravity Prominent RUE rest tremor is intermittently present.  Deep Tendon Reflexes: 1+ and symmetric throughout Cerebellar: Unable to assess Gait: Unable to assess   Labs/Imaging/Neurodiagnostic studies   See below  ASSESSMENT  78 year old female with a PMHx of arthritis, bipolar disorder, HTN, recent humerus fracture, falls and osteoporosis who presents from her  combined assisted living/skilled nursing facility via EMS for acute onset of left sided weakness, aphasia and left hemineglect. LKN was sometime during the 3 - 11 PM shift of her nurse on Thursday. The RN had stated during 11 PM sign out that the patient had become confused sometime during her shift and was not behaving normally. The patient was brought to her room, which she shares with a roommate, and went to bed. She was later found on the floor at the side of her bed at 2 AM with worsened confusion. Over the next 30 minutes she continued to worsen and EMS was then called. On EMS arrival, they noted  left sided weakness, aphasia and left hemineglect as well as intermittent coarse right upper extremity rest tremor. She was essentially mute with incomprehensible moaning and was not responding to commands. Vitals per EMS: 200/110, HR 130, O2 sats 98%. At her baseline she ambulates independently, is conversant and able to eat on her own, bathe and dress herself. Staff at her facility is not aware of any history of dementia or dementia-like symptoms.  - Exam reveals findings best localizable to the right frontal, parietal and occipital lobes.  - CT head: No evidence of acute intracranial abnormality on this motion limited study. ASPECTS is 10. 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 - CTA of head and neck: CTA is negative for large vessel occlusion. Negative motion degraded CT Perfusion. Generalized vessel tortuosity with mild for age atherosclerosis in the head and neck. Although Moderate stenosis of the Left ACA A2 segment. No other significant arterial stenosis. - EKG: Extreme tachycardia with wide complex, no further rhythm analysis attempted - Labs: U/A unremerkable, WBC 10.6, glucose 226, coags normal, Na low at 128, K low at 3.3, BUN and Cr normal, LFTs normal. Alkaline phosphatase elevated at  198.  - DDx for presentation: - Acute right MCA stroke is highest on the DDx. Unfortunately, she is outside of the TNK time window. No indication for endovascular thrombolysis due to no LVO on CTA.  - Lower on the DDx is an unwitnessed seizure with postictal state, versus subclinical seizure activity - Significantly less likely, but also possible is a meningitis given mild rigidity to neck flexion on exam and her encephalopathy, but there is no rigidity in rotation, she is afebrile and white count of 10.6 is minimally elevated (normal upper limit of 10.5).    RECOMMENDATIONS  - STAT MRI brain with and without contrast (ordered) - If MRI brain is negative, will obtain STAT EEG - If STAT EEG negative will need LP.  - Discussed with Dr. Madilyn Hook ______________________________________________________________________    Dessa Phi, Cherilyn Sautter, MD [facility]

## 2023-09-25 NOTE — Progress Notes (Signed)
 LTM started  All impedances below 10kohms.  Patient event  button tested.  [facility] notified.

## 2023-09-25 NOTE — Progress Notes (Signed)
 EEG electrodes D/c'd, Ok to D/c Per Dr. Melynda Ripple

## 2023-09-25 NOTE — Procedures (Signed)
 Patient Name: Emily Blair  MRN: [id number]  Epilepsy Attending: Charlsie Quest  Referring Physician/Provider: Caryl Pina, MD  Date: 09/25/2023 Duration: 26.36 mins  Patient history: 78 year old female presented with acute onset of left sided weakness, aphasia and left hemineglect. EEG to evaluate for seizure  Level of alertness:  comatose/ lethargic   AEDs during EEG study: Ativan  Technical aspects: This EEG study was done with scalp electrodes positioned according to the 10-20 International system of electrode placement. Electrical activity was reviewed with band pass filter of 1-70Hz , sensitivity of 7 uV/mm, display speed of 50mm/sec with a 60Hz  notched filter applied as appropriate. EEG data were recorded continuously and digitally stored.  Video monitoring was available and reviewed as appropriate.  Description: EEG showed continuous generalized and lateralized right hemisphere 3 to 6 Hz theta-delta slowing.  Hyperventilation and photic stimulation were not performed.     ABNORMALITY -Continuous slow generalized and lateralized right hemisphere  IMPRESSION: This study is suggestive of cortical dysfunction in right hemisphere likely secondary to underlying structural abnormality, postictal state.  Additionally there is moderate to severe diffuse encephalopathy.  No seizures or definite epileptiform discharges were seen throughout the recording.  Sanyiah Kanzler [name]

## 2023-09-25 NOTE — H&P (Signed)
 NAME:  Emily Blair, MRN:  [id number], DOB:  21-Dec-1945, LOS: 0 ADMISSION DATE:  09/25/2023, CONSULTATION DATE: 09/25/2023 REFERRING MD: Madilyn Hook -ED [facility], CHIEF COMPLAINT: Altered mental status  History of Present Illness:  78 year old woman who was sent in via EMS from her assisted living facility for acute left-sided weakness aphasia and left hemineglect starting sometime during the evening hours yesterday.  She had not been behaving normally at that time with some confusion and then was found on the floor early this morning with worsening confusion. Given her focal symptoms of left-sided weakness a code stroke was called but CT perfusion and MRI were both negative for an acute stroke.  While initially no movement on the left side, exam improved somewhat after CT. Due to worsening mental status and concern for airway protection she was electively intubated. Apart from a right arm tremor there was no identified seizure activity. Initial EEG was negative.  She has a history of bipolar affective disorder, hypertension diffuse osteoarthritis.  She is at baseline independent for activities of daily living.   Pertinent  Medical History   Past Medical History:  Diagnosis Date   Arthritis    Bipolar 1 disorder (HCC)    Hypertension    Osteoporosis    History reviewed. No pertinent surgical history.   Significant Hospital Events: Including procedures, antibiotic start and stop dates in addition to other pertinent events   3/7-admitted to hospital for AMS.  MRI with contrast unremarkable, negative spot EEG.  3/7 LP performed.  CSF clear and colorless.  Interim History / Subjective:  Has remained generally calm post intubation off sedation.  She was started on propofol prior to performing the LP.  Objective   Blood pressure 111/77, pulse (!) 102, temperature (!) 100.9 F (38.3 C), resp. rate (!) 21, height 5\' 8"  (1.727 m), weight 68.5 kg, SpO2 99%.    Vent Mode: PRVC FiO2 (%):  [40 %] 40  % Set Rate:  [18 bmp] 18 bmp Vt Set:  [450 mL] 450 mL PEEP:  [5 cmH20] 5 cmH20 Plateau Pressure:  [17 cmH20] 17 cmH20   Intake/Output Summary (Last 24 hours) at 09/25/2023 1004 Last data filed at 09/25/2023 0437 Gross per 24 hour  Intake --  Output 600 ml  Net -600 ml   Filed Weights   09/25/23 0300  Weight: 68.5 kg    Examination: General: Frail-appearing elderly woman.  Patient is stuporous. HENT: Orally intubated.  OG tube in place.  Normal sclera.  No lymphadenopathy. Lungs: Clear to auscultation bilaterally Cardiovascular: Normal heart sounds with no murmurs. Abdomen: Abdomen soft and nontender. Extremities: Osteoarthritic changes.  No edema. Neuro: No response to voice.  Opens eyes weakly to painful stimulation and winces.  Brainstem reflexes are present.  Baseline right arm tremor which is exacerbated by any painful stimulation.  No movement left upper limb.  Withdraws in both lowers. GU: Foley catheter in place with abundant clear urine.  Ancillary tests personally reviewed:  Acceptable post intubation ABG. Chest x-ray shows ET tube in acceptable position.  No infiltrates. Hyponatremia at presentation with sodium of 128.  Hypokalemia at 3.3 Hyperglycemia 226. Normal LFTs. Mild leukocytosis 10.6.  Normal hemoglobin 14.0  Assessment & Plan:  Unexplained altered mental status without visible abnormalities on MRI. 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 Possibility of meningitis/encephalitis cannot be ruled out. Hyponatremia likely secondary to seizure activity.  Sodium not low enough to in of itself explain altered  mental status.  May be due to chronic Tegretol use. Fairly minimal medication list, unlikely secondary to medication misuse, particularly in supervised setting.  Plan:  -Keep intubated with full ventilatory support for airway protection until investigations complete including overnight  EEG. -May use propofol for comfort but minimize to avoid masking any seizure activity. -Hold on anticonvulsant load at this time. -Slowly correct sodium using normal saline -LP performed to rule out infection.  Continue empiric antibiotics at this time. -Hold home medications at the present time.   Best Practice (right click and "Reselect all SmartList Selections" daily)   Diet/type: tubefeeds DVT prophylaxis LMWH Pressure ulcer(s): N/A GI prophylaxis: H2B Lines: N/A Foley:  Yes, and it is still needed Code Status:  full code Last date of multidisciplinary goals of care discussion [unable to reach sister who is listed as point of contact.]  CRITICAL CARE Performed by: Lynnell Catalan   Total critical care time: 35 minutes  Critical care time was exclusive of separately billable procedures and treating other patients.  Critical care was necessary to treat or prevent imminent or life-threatening deterioration.  Critical care was time spent personally by me on the following activities: development of treatment plan with patient and/or surrogate as well as nursing, discussions with consultants, evaluation of patient's response to treatment, examination of patient, obtaining history from patient or surrogate, ordering and performing treatments and interventions, ordering and review of laboratory studies, ordering and review of radiographic studies, pulse oximetry, re-evaluation of patient's condition and participation in multidisciplinary rounds.  Lynnell Catalan, MD [facility] ICU Physician [facility] [place] [facility]  Pager: [phone] Mobile: [phone] After hours: [phone].

## 2023-09-25 NOTE — Progress Notes (Addendum)
 Pt was transported via ventilator to 15M with no apparent complications. 15M RT has been notified.

## 2023-09-25 NOTE — ED Triage Notes (Signed)
 BIB EMS from nursing facility with AMS. Per facility report patient is alert and oriented X4. Throughout the day patient with mild confusion. Around 2AM. Patient noted to be on side of bed but oriented. Reports getting patient up and then patient becoming more altered, slurred speech, vomiting and left sided weakness.

## 2023-09-25 NOTE — Procedures (Signed)
 Lumbar Puncture Procedure Note  Emily Blair  [id number]  08-Aug-1945  Date:09/25/23  Time:10:02 AM   Provider Performing:Kaelin Bonelli   Procedure: Lumbar Puncture (40981)  Indication(s) Rule out meningitis  Consent Unable to obtain consent due to emergent nature of procedure.  Anesthesia Topical only with 1% lidocaine    Time Out Verified patient identification, verified procedure, site/side was marked, verified correct patient position, special equipment/implants available, medications/allergies/relevant history reviewed, required imaging and test results available.   Sterile Technique Maximal sterile technique including sterile barrier drape, hand hygiene, sterile gown, sterile gloves, mask, hair covering.    Procedure Description Using palpation, approximate location of L3-L4 space identified.   Lidocaine used to anesthetize skin and subcutaneous tissue overlying this area.  A 20g spinal needle was then used to access the subarachnoid space. Opening pressure: 11cm H2O. Closing pressure:Not obtained. 14mL CSF obtained. Clear colorless.  Complications/Tolerance None; patient tolerated the procedure well.   EBL none   Specimen(s) CSF -sent for cell count with differential, protein, glucose, Gram stain and bacterial culture, encephalitis panel, cytology.  Lynnell Catalan, MD [facility] ICU Physician [facility] [place] [facility]  Pager: [phone] Or Epic Secure Chat After hours: [phone].  09/25/2023, 10:04 AM

## 2023-09-25 NOTE — Progress Notes (Signed)
 PT Cancellation Note  Patient Details Name: Emily Blair MRN: [id number] DOB: 03/02/1946   Cancelled Treatment:    Reason Eval/Treat Not Completed: Patient not medically ready (pt admitted today, obtunded and intubated, not yet appropriate)   Davey Bergsma B Devonna Oboyle 09/25/2023, 12:36 PM ;Merryl Hacker, PT Acute Rehabilitation Services Office: [phone]

## 2023-09-25 NOTE — Progress Notes (Signed)
Stat  EEG complete - results pending.  

## 2023-09-25 NOTE — Progress Notes (Signed)
 SLP Cancellation Note  Patient Details Name: Emily Blair MRN: [id number] DOB: 1945/10/28   Cancelled treatment:       Reason Eval/Treat Not Completed: Patient not medically ready. Pt obtunded and intubated, not yet appropriate. Will f/u as able.    Mahala Menghini., M.A. CCC-SLP Acute Rehabilitation Services Office [phone]  Secure chat preferred  09/25/2023, 1:07 PM

## 2023-09-25 NOTE — Progress Notes (Signed)
 Initial Nutrition Assessment  DOCUMENTATION CODES:  Not applicable  INTERVENTION:  TF via OGT: Start Osmolite 1.5 at 64ml/hr and advance by 10ml q6h to goal rate of 86ml/hr ( per day) 60ml ProSource TF20 once daily TF at goal provides 1700 kcal, 88g protein and free water daily  Suspect pt is likely at elevated refeeding risk, recommend: Monitoring refeeding labs, MD to order repletion as appropriate Thiamine 100mg  daily x5 days  NUTRITION DIAGNOSIS:  Inadequate oral intake related to acute illness as evidenced by NPO status.  GOAL:  Patient will meet greater than or equal to 90% of their needs  MONITOR:  Labs, Weight trends, Vent status, TF tolerance  REASON FOR ASSESSMENT:  Consult, Ventilator Enteral/tube feeding initiation and management, Assessment of nutrition requirement/status  ASSESSMENT:  Pt admitted from ALF with acute L-sided weakness, aphasia and L hemi neglect as well as confusion. PMH significant for arthritis, bipolar 1 disorder, HTN.  3/7: admitted, intubated, MRI unremarkable, s/p LP to r/o meningitis  Patient is currently intubated on ventilator support MV: 9.1 L/min Temp (24hrs), Avg:101.3 F (38.5 C), Min:98.7 F (37.1 C), Max:102 F (38.9 C)  Pt on EEG monitor at time of visit. Multiple nurses present at time of visit as pt just transferred to the unit.   No family/friends present at bedside at time of visit. Unable to obtain detailed nutrition related history at this time.   OGT in place with TF being hooked up. Vital High Protein hanging per TF protocol. Adjusting order to better meet nutrition needs.   Admit weight: 68.5 kg Current weight: 67.3 kg No prior weight history on file to review.   Drains/lines: OGT (tip and side port coiling within fundus of the stomach) UOP: x12 hours  Medications: colace BID, pepcid BID, SSI 0-15 units q4h, miralax daily Drips: Abx Propofol @ 2.21ml/hr   Labs:  Sodium 132 BUN 6 Phos  2.2 Alkaline phos 191  NUTRITION - FOCUSED PHYSICAL EXAM: Pt noted to have muscle and fat deficits on nutrition focused physical exam. Given limited nutrition, weight and medical history, uncertain what these deficits are related to. Will attempt to gather more information on follow up or once extubated.  Flowsheet Row Most Recent Value  Orbital Region Severe depletion  Upper Arm Region Mild depletion  Thoracic and Lumbar Region No depletion  Buccal Region Unable to assess  Temple Region Severe depletion  Clavicle Bone Region Moderate depletion  Clavicle and Acromion Bone Region Mild depletion  Scapular Bone Region Mild depletion  Dorsal Hand Unable to assess  [in handmits]  Patellar Region Severe depletion  Anterior Thigh Region Severe depletion  Posterior Calf Region Moderate depletion  Edema (RD Assessment) None  Hair Reviewed  Eyes Unable to assess  Mouth Unable to assess  Skin Reviewed  Nails Unable to assess    Diet Order:   Diet Order             Diet NPO time specified  Diet effective now                   EDUCATION NEEDS:  No education needs have been identified at this time  Skin:  Skin Assessment: Reviewed RN Assessment  Last BM:  unknown/PTA  Height:  Ht Readings from Last 1 Encounters:  09/25/23 5\' 8"  (1.727 m)    Weight:  Wt Readings from Last 1 Encounters:  09/25/23 67.3 kg   BMI:  Body mass index is 22.56 kg/m.  Estimated Nutritional Needs:  Kcal:  1700-1900  Protein:  85-100g  Fluid:  >/=1.7L  Drusilla Kanner, RDN, LDN Clinical Nutrition

## 2023-09-26 ENCOUNTER — Inpatient Hospital Stay (HOSPITAL_COMMUNITY)

## 2023-09-26 DIAGNOSIS — R531 Weakness: Secondary | ICD-10-CM

## 2023-09-26 DIAGNOSIS — R29818 Other symptoms and signs involving the nervous system: Secondary | ICD-10-CM

## 2023-09-26 DIAGNOSIS — A419 Sepsis, unspecified organism: Secondary | ICD-10-CM

## 2023-09-26 DIAGNOSIS — R4182 Altered mental status, unspecified: Secondary | ICD-10-CM | POA: Diagnosis not present

## 2023-09-26 DIAGNOSIS — J9601 Acute respiratory failure with hypoxia: Secondary | ICD-10-CM

## 2023-09-26 DIAGNOSIS — R4701 Aphasia: Secondary | ICD-10-CM | POA: Diagnosis not present

## 2023-09-26 DIAGNOSIS — I10 Essential (primary) hypertension: Secondary | ICD-10-CM

## 2023-09-26 DIAGNOSIS — R569 Unspecified convulsions: Secondary | ICD-10-CM | POA: Diagnosis not present

## 2023-09-26 DIAGNOSIS — E871 Hypo-osmolality and hyponatremia: Secondary | ICD-10-CM | POA: Diagnosis not present

## 2023-09-26 DIAGNOSIS — J9600 Acute respiratory failure, unspecified whether with hypoxia or hypercapnia: Secondary | ICD-10-CM | POA: Diagnosis not present

## 2023-09-26 LAB — GLUCOSE, CAPILLARY
Glucose-Capillary: 114 mg/dL — ABNORMAL HIGH (ref 70–99)
Glucose-Capillary: 128 mg/dL — ABNORMAL HIGH (ref 70–99)
Glucose-Capillary: 149 mg/dL — ABNORMAL HIGH (ref 70–99)
Glucose-Capillary: 106 mg/dL — ABNORMAL HIGH (ref 70–99)
Glucose-Capillary: 172 mg/dL — ABNORMAL HIGH (ref 70–99)
Glucose-Capillary: 102 mg/dL — ABNORMAL HIGH (ref 70–99)

## 2023-09-26 LAB — BASIC METABOLIC PANEL
Anion gap: 11 (ref 5–15)
BUN: 20 mg/dL (ref 8–23)
CO2: 26 mmol/L (ref 22–32)
Calcium: 10.2 mg/dL (ref 8.9–10.3)
Chloride: 98 mmol/L (ref 98–111)
Creatinine, Ser: 0.69 mg/dL (ref 0.44–1.00)
GFR, Estimated: >60 mL/min (ref >60)
Glucose, Bld: 126 mg/dL — ABNORMAL HIGH (ref 70–99)
Potassium: 3.6 mmol/L (ref 3.5–5.1)
Sodium: 135 mmol/L (ref 135–145)

## 2023-09-26 LAB — PHOSPHORUS
Phosphorus: 2 mg/dL — ABNORMAL LOW (ref 2.5–4.6)
Phosphorus: 2.1 mg/dL — ABNORMAL LOW (ref 2.5–4.6)

## 2023-09-26 LAB — MAGNESIUM
Magnesium: 1.9 mg/dL (ref 1.7–2.4)
Magnesium: 1.9 mg/dL (ref 1.7–2.4)

## 2023-09-26 LAB — LACTIC ACID, PLASMA: Lactic Acid, Venous: 2.7 mmol/L (ref 0.5–1.9)

## 2023-09-26 LAB — CARBAMAZEPINE LEVEL, TOTAL: Carbamazepine Lvl: 3.7 ug/mL — ABNORMAL LOW (ref 4.0–12.0)

## 2023-09-26 MED ORDER — SODIUM CHLORIDE 0.9 % IV BOLUS
1000.0000 mL | Freq: Once | INTRAVENOUS | Status: AC
  Administered 2023-09-26: 1000 mL via INTRAVENOUS

## 2023-09-26 MED ORDER — NOREPINEPHRINE 4 MG/250ML-% IV SOLN
2.0000 ug/min | INTRAVENOUS | Status: DC
  Filled 2023-09-26: qty 250

## 2023-09-26 MED ORDER — SODIUM CHLORIDE 0.9 % IV SOLN
250.0000 mL | INTRAVENOUS | Status: DC
Start: 2023-09-26 — End: 2023-09-27

## 2023-09-26 MED ORDER — LORAZEPAM 2 MG/ML IJ SOLN
2.0000 mg | Freq: Once | INTRAMUSCULAR | Status: DC | PRN
  Filled 2023-09-26: qty 1

## 2023-09-26 MED ORDER — DEXMEDETOMIDINE HCL IN NACL 400 MCG/100ML IV SOLN
0.0000 ug/kg/h | INTRAVENOUS | Status: DC
  Administered 2023-09-26: 0.4 ug/kg/h via INTRAVENOUS
  Administered 2023-09-27: 0.3 ug/kg/h via INTRAVENOUS
  Filled 2023-09-26 (×2): qty 100

## 2023-09-26 MED ORDER — POTASSIUM PHOSPHATES 15 MMOLE/5ML IV SOLN
15.0000 mmol | Freq: Once | INTRAVENOUS | Status: AC
  Administered 2023-09-26: 15 mmol via INTRAVENOUS
  Filled 2023-09-26: qty 5

## 2023-09-26 MED ORDER — CHLORHEXIDINE GLUCONATE CLOTH 2 % EX PADS
6.0000 | MEDICATED_PAD | CUTANEOUS | Status: DC
  Administered 2023-09-26 – 2023-10-01 (×4): 6 via TOPICAL

## 2023-09-26 MED ORDER — SODIUM CHLORIDE 0.9 % IV SOLN
3.0000 g | Freq: Four times a day (QID) | INTRAVENOUS | Status: AC
  Administered 2023-09-26 – 2023-09-29 (×13): 3 g via INTRAVENOUS
  Filled 2023-09-26 (×12): qty 8

## 2023-09-26 NOTE — Progress Notes (Signed)
 OT Cancellation Note  Patient Details Name: ALAIRA LEVEL MRN: [id number] DOB: 1945-10-14   Cancelled Treatment:    Reason Eval/Treat Not Completed: Patient not medically ready. Pt intubated, sedated and not medically appropriate. will sign off and await new order   Emelda Fear 09/26/2023, 7:56 AM  Nyoka Cowden OTR/L Acute Rehabilitation Services Office: [phone]

## 2023-09-26 NOTE — Progress Notes (Signed)
 NAME:  Emily Blair, MRN:  [id number], DOB:  [birth year], LOS: 1 ADMISSION DATE:  09/25/2023, CONSULTATION DATE: 09/25/2023 REFERRING MD: Madilyn Hook -ED [facility], CHIEF COMPLAINT: Altered mental status  History of Present Illness:  78 year old woman who was sent in via EMS from her assisted living facility for acute left-sided weakness aphasia and left hemineglect starting sometime during the evening hours yesterday.  She had not been behaving normally at that time with some confusion and then was found on the floor early this morning with worsening confusion. Given her focal symptoms of left-sided weakness a code stroke was called but CT perfusion and MRI were both negative for an acute stroke.  While initially no movement on the left side, exam improved somewhat after CT. Due to worsening mental status and concern for airway protection she was electively intubated. Apart from a right arm tremor there was no identified seizure activity. Initial EEG was negative.  She has a history of bipolar affective disorder, hypertension diffuse osteoarthritis.  She is at baseline independent for activities of daily living.   Pertinent  Medical History   Past Medical History:  Diagnosis Date   Arthritis    Bipolar 1 disorder (HCC)    Hypertension    Osteoporosis    History reviewed. No pertinent surgical history.   Significant Hospital Events: Including procedures, antibiotic start and stop dates in addition to other pertinent events   3/7-admitted to hospital for AMS.  MRI with contrast unremarkable, negative spot EEG.  3/7 LP performed.  CSF clear and colorless.  Interim History / Subjective:  Has remained generally calm post intubation off sedation.  She was started on propofol prior to performing the LP.  Objective   Blood pressure (!) 77/62, pulse 88, temperature (!) 96.4 F (35.8 C), temperature source Axillary, resp. rate 18, height 5' 7.99" (1.727 m), weight 70.5 kg, SpO2 100%.    Vent Mode:  PRVC FiO2 (%):  [35 %-40 %] 35 % Set Rate:  [18 bmp] 18 bmp Vt Set:  [510 mL] 510 mL PEEP:  [5 cmH20] 5 cmH20 Plateau Pressure:  [10 cmH20-17 cmH20] 14 cmH20   Intake/Output Summary (Last 24 hours) at 09/26/2023 1335 Last data filed at 09/26/2023 1200 Gross per 24 hour  Intake 2163.7 ml  Output 1200 ml  Net 963.7 ml   Filed Weights   09/25/23 0300 09/25/23 1052 09/26/23 0215  Weight: 68.5 kg 67.3 kg 70.5 kg    Examination: Propofol 30> stopped General:  critically ill older female sedated on MV HEENT: MM pink/moist, ETT/ OGT, pupils 2/ sluggish, mild R scleral edema, +corneals, cEEG Neuro: unresponsive to painful stimuli CV: rr, ST 104, no  murmur PULM:  non labored, MV supported, clear, no secretions, + cough GI: soft, bs+, NT/ ND, foley -cyu Extremities: warm/dry, no LE edema  Skin: no rashes   Afebrile Labs pending> resent due to hemolysis  UOP 1L Net +2.8L Tmax 100.3  Ancillary tests personally reviewed:    Assessment & Plan:   Acute encephalopathy w/ left sided weakness/ left hemi-neglect, r/o polypharmacy  R/o seizure/ prolonged post-ictal state  - imaging/ MRI unrevealing thus far - Biofire meningitis/ encephalitis panel neg, ok w/ neurology to d/c CNS abx coverage - appreciate Neurology input, suspected postitical/ prolonged state.  cEEG for now, seizure precautions, monitor.   - minimize sedation - check tegretol level (taken for behavioral reasons, no prior hx of seizures on EMR) - cont supportive care  Acute respiratory insufficiency due to above  Left basilar atelectasis vs ?infiltrate - cont full MV support, 4-8cc/kg IBW with goal Pplat <30 and DP<15  - VAP prevention protocol/ PPI - PAD protocol for sedation> stop propofol given hypotension.  Precedex/ fentanyl prn, ativan prn seizure, RASS goal 0/-1 - goal SpO2 >92%  - daily SAT & SBT if appropriate > not awake - no sputum, but temp now 101.2, cont unasyn for aspiration coverage - EN per  RD  Hypophosphatemia - k 3.7, will replete 15 mmol kphos.  Recheck in am  Hyponatremia - correcting, not significant to cause AMS.  Trend on BMET  Sepsis - RVP neg - CNS workup neg, change to aspiration coverage for now - possible aspiration PNA - goal MAP > 65, BP lower this am with minimal UOP, improved after LR bolus - trend WBC/ fever curve - follow cultures - follow UOP/ renal indices closely  HLD HTN - hold lopressor, lisinopril with soft BP on propofol> stopping   Bipolar  Arthritis - appears to be on oxy at home, ? For prior LUE humeral fx 2/2 fall, unclear timing. Prn fentanyl as above - check tegretol level  Best Practice (right click and "Reselect all SmartList Selections" daily)   Diet/type: tubefeeds DVT prophylaxis LMWH Pressure ulcer(s): N/A GI prophylaxis: H2B Lines: N/A Foley:  Yes, and it is still needed Code Status:  full code Last date of multidisciplinary goals of care discussion [unable to reach sister who is listed as point of contact.]  Pending 3/8, no family currently at bedside  Total critical care time: 35 minutes   Posey Boyer, MSN, AG-ACNP-BC [facility] 09/26/2023, 1:35 PM  See Amion for pager If no response to pager , please call [phone] until 7pm After 7:00 pm call Elink  [phone]

## 2023-09-26 NOTE — Progress Notes (Addendum)
 eLink Physician-Brief Progress Note Patient Name: Emily Blair DOB: 1945-09-09 MRN: [id number]   Date of Service  09/26/2023  HPI/Events of Note  Request for restraints. She is intubated and pulling at the tube.   Also, wondering if we can start a pressor. Unable to titrate up on sedation because of hypotension.   eICU Interventions  Discussed sedation MX with precedex/int fent with RN Restriants x 12h     Intervention Category Intermediate Interventions: Hypotension - evaluation and management  Desani Sprung V. Rhythm Wigfall  09/26/2023, 8:57 PM  BP still soft. 95/49 (60) Precedex at 0.01  -Add levo PIV  10:00 PM

## 2023-09-26 NOTE — Procedures (Addendum)
 Patient Name: PROMISS LABARBERA  MRN: [id number]  Epilepsy Attending: Charlsie Quest  Referring Physician/Provider: Lynnell Catalan, MD  Duration: 09/25/2023 1137 to 09/26/2023 1137   Patient history: 78 year old female presented with acute onset of left sided weakness, aphasia and left hemineglect. EEG to evaluate for seizure   Level of alertness:  comatose/ lethargic    AEDs during EEG study: CBZ, Propofol   Technical aspects: This EEG study was done with scalp electrodes positioned according to the 10-20 International system of electrode placement. Electrical activity was reviewed with band pass filter of 1-70Hz , sensitivity of 7 uV/mm, display speed of 60mm/sec with a 60Hz  notched filter applied as appropriate. EEG data were recorded continuously and digitally stored.  Video monitoring was available and reviewed as appropriate.   Description: EEG showed continuous generalized and lateralized right hemisphere 3 to 6 Hz theta-delta slowing. Sharply contoured waves were noted in left posterior quadrant. Hyperventilation and photic stimulation were not performed.      ABNORMALITY -Continuous slow generalized and lateralized right hemisphere   IMPRESSION: This study is suggestive of cortical dysfunction in right hemisphere likely secondary to underlying structural abnormality, postictal state.  Additionally there is moderate to severe diffuse encephalopathy.  No seizures or definite epileptiform discharges were seen throughout the recording.   Kimiah Hibner [name]

## 2023-09-26 NOTE — Progress Notes (Signed)
LTM maint complete - no skin breakdown under: Fp1 Fp2 F7 A1

## 2023-09-26 NOTE — Progress Notes (Signed)
 NEUROLOGY CONSULT FOLLOW UP NOTE   Date of service: September 26, 2023 Patient Name: Emily Blair MRN:  [id number] DOB:  1945-10-16  Interval Hx/subjective   CSF is unremarkable.  EEG with focal slowing but no seizure.  She is currently sedated.  Vitals   Vitals:   09/26/23 0800 09/26/23 0900 09/26/23 1058 09/26/23 1125  BP: 134/89 103/80 (!) 82/69   Pulse: 100  94   Resp: 12 18    Temp:    (!) 96.4 F (35.8 C)  TempSrc:    Axillary  SpO2: 100%  100%   Weight:      Height:         Body mass index is 23.64 kg/m.  Physical Exam   General: Intubated and sedated on propofol 40  Neurologic Examination    MS: Does not open eyes or follow commands CN: Pupils are equal reactive bilaterally, blinks to eyelid stimulation bilaterally Motor: She withdraws to noxious stimulation bilaterally Sensory: As above  Medications  Current Facility-Administered Medications:    acetaminophen (TYLENOL) tablet 650 mg, 650 mg, Per Tube, Q6H PRN, Lynnell Catalan, MD, 650 mg at 09/25/23 1149   albuterol (PROVENTIL) (2.5 MG/3ML) 0.083% nebulizer solution 2.5 mg, 2.5 mg, Nebulization, Q2H PRN, Agarwala, Ravi, MD   ampicillin (OMNIPEN) 2 g in sodium chloride 0.9 % 100 mL IVPB, 2 g, Intravenous, Q4H, Agarwala, Ravi, MD, Paused at 09/26/23 8295   carBAMazepine (TEGRETOL) 100 MG/5ML suspension 200 mg, 200 mg, Per Tube, BID, Agarwala, Ravi, MD, 200 mg at 09/26/23 0951   cefTRIAXone (ROCEPHIN) 2 g in sodium chloride 0.9 % 100 mL IVPB, 2 g, Intravenous, Q12H, Agarwala, Ravi, MD, Last Rate: 200 mL/hr at 09/26/23 0946, 2 g at 09/26/23 0946   Chlorhexidine Gluconate Cloth 2 % PADS 6 each, 6 each, Topical, Daily, Agarwala, Ravi, MD, 6 each at 09/26/23 0044   docusate (COLACE) 50 MG/5ML liquid 100 mg, 100 mg, Per Tube, BID, Tilden Fossa, MD, 100 mg at 09/26/23 0804   enoxaparin (LOVENOX) injection 40 mg, 40 mg, Subcutaneous, QHS, Agarwala, Ravi, MD, 40 mg at 09/25/23 2148   famotidine (PEPCID) tablet 20 mg, 20 mg,  Per Tube, BID, Agarwala, Ravi, MD, 20 mg at 09/26/23 0805   feeding supplement (OSMOLITE 1.5 CAL) liquid 1,000 mL, 1,000 mL, Per Tube, Q24H, Agarwala, Ravi, MD, 1,000 mL at 09/26/23 0947   feeding supplement (PROSource TF20) liquid 60 mL, 60 mL, Per Tube, Daily, Agarwala, Ravi, MD, 60 mL at 09/26/23 0804   fentaNYL (SUBLIMAZE) injection 25-100 mcg, 25-100 mcg, Intravenous, Q30 min PRN, Tilden Fossa, MD, 50 mcg at 09/26/23 6213   insulin aspart (novoLOG) injection 0-15 Units, 0-15 Units, Subcutaneous, Q4H, Agarwala, Ravi, MD, 2 Units at 09/26/23 0865   Oral care mouth rinse, 15 mL, Mouth Rinse, Q2H, Agarwala, Ravi, MD, 15 mL at 09/26/23 1000   Oral care mouth rinse, 15 mL, Mouth Rinse, PRN, Agarwala, Ravi, MD   PHENYLephrine 80 mcg/ml in normal saline Adult IV Push Syringe (For Blood Pressure Support), 80-200 mcg, Intravenous, Once PRN, Tilden Fossa, MD   polyethylene glycol (MIRALAX / GLYCOLAX) packet 17 g, 17 g, Per Tube, Daily, Tilden Fossa, MD, 17 g at 09/26/23 0805   propofol (DIPRIVAN) 1000 MG/100ML infusion, 5-80 mcg/kg/min, Intravenous, Continuous, Tilden Fossa, MD, Last Rate: 16.44 mL/hr at 09/26/23 0900, 40 mcg/kg/min at 09/26/23 0900   thiamine (VITAMIN B1) tablet 100 mg, 100 mg, Per Tube, Daily, Agarwala, Ravi, MD, 100 mg at 09/26/23 0805   vancomycin (VANCOREADY) IVPB 750  mg/150 mL, 750 mg, Intravenous, Q12H, Agarwala, Ravi, MD, Last Rate: 150 mL/hr at 09/26/23 1101, 750 mg at 09/26/23 1101  Labs and Diagnostic Imaging   CSF RBC 16 CSF WBC one CSF protein 43 CSF glucose 94 BioFire meningitis/encephalitis panel-negative   Imaging(Personally reviewed): MRI and CT perfusion were negative  Assessment   Emily Blair is a 78 y.o. female with presentation of left-sided weakness and left hemineglect with negative imaging and workup.  My suspicion is she likely did have a seizure with prolonged postictal state.  There has been no other explanation for her acute focal  neurological symptoms found otherwise.  I would favor weaning sedation at this time, but will hold off on starting antiepileptics.  Recommendations  Can discontinue CNS antibiotic coverage, will defer to primary team in case these are being used for another reason as well Continue LTM EEG as sedation is weaned Neurology will continue to follow ______________________________________________________________________   Signed, Ritta Slot, MD [facility]

## 2023-09-26 NOTE — Progress Notes (Signed)
 PT Cancellation Note  Patient Details Name: Emily Blair MRN: [id number] DOB: [birth year]   Cancelled Treatment:    Reason Eval/Treat Not Completed: Patient not medically ready (pt remains intubated, sedated and not medically appropriate. will sign off and await new order)   Yaron Grasse B Dymin Dingledine 09/26/2023, 7:33 AM Merryl Hacker, PT Acute Rehabilitation Services Office: [phone]

## 2023-09-26 NOTE — Progress Notes (Signed)
 LTM maint complete - no skin breakdown under:  Fp1 F7 [facility] monitored, Event button test confirmed by [facility].

## 2023-09-26 NOTE — Progress Notes (Signed)
 Pharmacy Antibiotic Note  Emily Blair is a 78 y.o. female admitted on 09/25/2023 with pneumonia.  Pharmacy has been consulted for unasyn dosing.  Plan: Unasyn 3G IV q6 hours Monitor clinical progression  Height: 5' 7.99" (172.7 cm) Weight: 70.5 kg (155 lb 6.8 oz) IBW/kg (Calculated) : 63.88  Temp (24hrs), Avg:98.9 F (37.2 C), Min:96.4 F (35.8 C), Max:100.4 F (38 C)  Recent Labs  Lab 09/25/23 0332 09/25/23 0340 09/25/23 0727 09/25/23 1056  WBC 10.6*  --   --   --   CREATININE 0.74 0.60  --  0.68  LATICACIDVEN  --   --  2.2*  --     Estimated Creatinine Clearance: 59.4 mL/min (by C-G formula based on SCr of 0.68 mg/dL).    No Known Allergies  Thank you for allowing pharmacy to be a part of this patient's care.  Emily Blair Emily Blair 09/26/2023 5:21 PM

## 2023-09-27 ENCOUNTER — Inpatient Hospital Stay (HOSPITAL_COMMUNITY)

## 2023-09-27 DIAGNOSIS — R29818 Other symptoms and signs involving the nervous system: Secondary | ICD-10-CM | POA: Diagnosis not present

## 2023-09-27 DIAGNOSIS — R531 Weakness: Secondary | ICD-10-CM | POA: Diagnosis not present

## 2023-09-27 DIAGNOSIS — R4182 Altered mental status, unspecified: Secondary | ICD-10-CM | POA: Diagnosis present

## 2023-09-27 DIAGNOSIS — R569 Unspecified convulsions: Secondary | ICD-10-CM | POA: Diagnosis not present

## 2023-09-27 DIAGNOSIS — E871 Hypo-osmolality and hyponatremia: Secondary | ICD-10-CM | POA: Diagnosis not present

## 2023-09-27 DIAGNOSIS — A419 Sepsis, unspecified organism: Secondary | ICD-10-CM | POA: Diagnosis not present

## 2023-09-27 DIAGNOSIS — R4701 Aphasia: Secondary | ICD-10-CM | POA: Diagnosis not present

## 2023-09-27 DIAGNOSIS — F0390 Unspecified dementia without behavioral disturbance: Secondary | ICD-10-CM | POA: Diagnosis not present

## 2023-09-27 DIAGNOSIS — J9600 Acute respiratory failure, unspecified whether with hypoxia or hypercapnia: Secondary | ICD-10-CM | POA: Diagnosis not present

## 2023-09-27 LAB — COMPREHENSIVE METABOLIC PANEL
ALT: 8 U/L (ref 0–44)
AST: 18 U/L (ref 15–41)
Albumin: 2.9 g/dL — ABNORMAL LOW (ref 3.5–5.0)
Alkaline Phosphatase: 127 U/L — ABNORMAL HIGH (ref 38–126)
Anion gap: 12 (ref 5–15)
BUN: 19 mg/dL (ref 8–23)
CO2: 21 mmol/L — ABNORMAL LOW (ref 22–32)
Calcium: 9.9 mg/dL (ref 8.9–10.3)
Chloride: 102 mmol/L (ref 98–111)
Creatinine, Ser: 0.71 mg/dL (ref 0.44–1.00)
GFR, Estimated: >60 mL/min (ref >60)
Glucose, Bld: 153 mg/dL — ABNORMAL HIGH (ref 70–99)
Potassium: 3 mmol/L — ABNORMAL LOW (ref 3.5–5.1)
Sodium: 135 mmol/L (ref 135–145)
Total Bilirubin: 0.8 mg/dL (ref 0.0–1.2)
Total Protein: 5.8 g/dL — ABNORMAL LOW (ref 6.5–8.1)

## 2023-09-27 LAB — CBC
HCT: 34.9 % — ABNORMAL LOW (ref 36.0–46.0)
Hemoglobin: 11.8 g/dL — ABNORMAL LOW (ref 12.0–15.0)
MCH: 33.3 pg (ref 26.0–34.0)
MCHC: 33.8 g/dL (ref 30.0–36.0)
MCV: 98.6 fL (ref 80.0–100.0)
Platelets: 247 10*3/uL (ref 150–400)
RBC: 3.54 MIL/uL — ABNORMAL LOW (ref 3.87–5.11)
RDW: 13.1 % (ref 11.5–15.5)
WBC: 8 10*3/uL (ref 4.0–10.5)
nRBC: 0 % (ref 0.0–0.2)

## 2023-09-27 LAB — RESPIRATORY PANEL BY PCR

## 2023-09-27 LAB — GLUCOSE, CAPILLARY
Glucose-Capillary: 158 mg/dL — ABNORMAL HIGH (ref 70–99)
Glucose-Capillary: 135 mg/dL — ABNORMAL HIGH (ref 70–99)
Glucose-Capillary: 133 mg/dL — ABNORMAL HIGH (ref 70–99)
Glucose-Capillary: 129 mg/dL — ABNORMAL HIGH (ref 70–99)
Glucose-Capillary: 135 mg/dL — ABNORMAL HIGH (ref 70–99)
Glucose-Capillary: 117 mg/dL — ABNORMAL HIGH (ref 70–99)
Glucose-Capillary: 121 mg/dL — ABNORMAL HIGH (ref 70–99)

## 2023-09-27 LAB — MAGNESIUM: Magnesium: 1.8 mg/dL (ref 1.7–2.4)

## 2023-09-27 LAB — STREP PNEUMONIAE URINARY ANTIGEN: Strep Pneumo Urinary Antigen: NEGATIVE

## 2023-09-27 LAB — PHOSPHORUS: Phosphorus: 2.4 mg/dL — ABNORMAL LOW (ref 2.5–4.6)

## 2023-09-27 MED ORDER — POTASSIUM CHLORIDE 20 MEQ PO PACK
20.0000 meq | PACK | ORAL | Status: AC
  Administered 2023-09-27 (×2): 20 meq
  Filled 2023-09-27 (×2): qty 1

## 2023-09-27 MED ORDER — POTASSIUM CHLORIDE 10 MEQ/100ML IV SOLN
10.0000 meq | INTRAVENOUS | Status: AC
  Administered 2023-09-27 (×4): 10 meq via INTRAVENOUS
  Filled 2023-09-27 (×4): qty 100

## 2023-09-27 MED ORDER — SENNOSIDES-DOCUSATE SODIUM 8.6-50 MG PO TABS
1.0000 | ORAL_TABLET | Freq: Two times a day (BID) | ORAL | Status: DC
  Administered 2023-09-27 (×2): 1
  Filled 2023-09-27 (×3): qty 1

## 2023-09-27 NOTE — Plan of Care (Signed)
  Problem: Education: Goal: Knowledge of General Education information will improve Description: Including pain rating scale, medication(s)/side effects and non-pharmacologic comfort measures Outcome: Progressing   Problem: Health Behavior/Discharge Planning: Goal: Ability to manage health-related needs will improve Outcome: Not Progressing   

## 2023-09-27 NOTE — Progress Notes (Signed)
 RT placed pt on CPAP/PS (8/5). Pt tolerating well at this time with SVS and able to follow commands. RN and CCM notified.

## 2023-09-27 NOTE — Procedures (Signed)
 Extubation Procedure Note  Patient Details:   Name: Emily Blair DOB: 11-Jan-1946 MRN: [id number]   Airway Documentation:    Vent end date: 09/27/23 Vent end time: 1729   Evaluation  O2 sats: stable throughout Complications: No apparent complications Patient did tolerate procedure well. Bilateral Breath Sounds: Clear, Diminished   Yes,  Per CCM order, RT extubated pt to [name]. Prior to extubation pt did have a positive cuff leak. Pt tolerated well with SVS, no complications, no stridor heard and pt was abel to state her name.  Megan Mans 09/27/2023, 5:31 PM

## 2023-09-27 NOTE — Progress Notes (Signed)
 Pts ventilator alarm read "No patient effort" RT placed pt back in full ventilatory support at previous settings. RN and CCM notified. Will attempt again at later time.

## 2023-09-27 NOTE — Progress Notes (Signed)
 [facility] ADULT ICU REPLACEMENT PROTOCOL   The patient does apply for the [facility] Adult ICU Electrolyte Replacment Protocol based on the criteria listed below:   1.Exclusion criteria: TCTS, ECMO, Dialysis, and Myasthenia Gravis patients 2. Is GFR >/= 30 ml/min? Yes.    Patient's GFR today is >60 3. Is SCr </= 2? Yes.   Patient's SCr is 0.71 mg/dL 4. Did SCr increase >/= 0.5 in 24 hours? No. 5.Pt's weight >40kg  Yes.   6. Abnormal electrolyte(s): K  7. Electrolytes replaced per protocol 8.  Call MD STAT for K+ </= 2.5, Phos </= 1, or Mag </= 1 Physician:  Halina Andreas E Geoffrey Mankin 09/27/2023 4:46 AM

## 2023-09-27 NOTE — Plan of Care (Signed)

## 2023-09-27 NOTE — Progress Notes (Signed)
 NAME:  Emily Blair, MRN:  [id number], DOB:  [birth year], LOS: 2 ADMISSION DATE:  09/25/2023, CONSULTATION DATE: 09/25/2023 REFERRING MD: Madilyn Hook -ED [facility], CHIEF COMPLAINT: Altered mental status  History of Present Illness:  78 year old woman who was sent in via EMS from her assisted living facility for acute left-sided weakness aphasia and left hemineglect starting sometime during the evening hours yesterday.  She had not been behaving normally at that time with some confusion and then was found on the floor early this morning with worsening confusion. Given her focal symptoms of left-sided weakness a code stroke was called but CT perfusion and MRI were both negative for an acute stroke.  While initially no movement on the left side, exam improved somewhat after CT. Due to worsening mental status and concern for airway protection she was electively intubated. Apart from a right arm tremor there was no identified seizure activity. Initial EEG was negative.  She has a history of bipolar affective disorder, hypertension diffuse osteoarthritis.  She is at baseline independent for activities of daily living.   Pertinent  Medical History   Past Medical History:  Diagnosis Date   Arthritis    Bipolar 1 disorder (HCC)    Hypertension    Osteoporosis    History reviewed. No pertinent surgical history.   Significant Hospital Events: Including procedures, antibiotic start and stop dates in addition to other pertinent events   3/7-admitted to hospital for AMS.  MRI with contrast unremarkable, negative spot EEG.  3/7 LP performed.  CSF clear and colorless.  Interim History / Subjective:  Liable BP x 2 overnight for no apparent good reason No clinical seizures, remains on cEEG Remains low dose precedex, following commands this morning, but still some ?left neglect and LUE weakness, failed SBT shortly due to apnea Tmax overnight 101.5, WBC improved  Objective   Blood pressure (!) 148/80, pulse  84, temperature 98.8 F (37.1 C), resp. rate 17, height 5' 7.99" (1.727 m), weight 71.4 kg, SpO2 100%.    Vent Mode: PSV;CPAP FiO2 (%):  [35 %] 35 % Set Rate:  [18 bmp] 18 bmp Vt Set:  [510 mL] 510 mL PEEP:  [5 cmH20] 5 cmH20 Pressure Support:  [8 cmH20] 8 cmH20 Plateau Pressure:  [13 cmH20-15 cmH20] 14 cmH20   Intake/Output Summary (Last 24 hours) at 09/27/2023 0829 Last data filed at 09/27/2023 0757 Gross per 24 hour  Intake 3513.98 ml  Output 785 ml  Net 2728.98 ml   Filed Weights   09/25/23 1052 09/26/23 0215 09/27/23 0423  Weight: 67.3 kg 70.5 kg 71.4 kg    Examination: Dex 0.3 General:  Older adult female lying in bed, awake in NAD HEENT: MM pink/moist, pupils 3/r, anicteric, apparent L neglect, ETT/ OGT Neuro: awakens to verbal, opens eyes, intermittent follows commands LUE but 2/5, 4-5/5 in remaining extremities CV: rr, ST, +murmur PULM:  on SBT 8/5 doing well but then back asleep/ apneic flipped back full support eventually, coarse on left, no wheeze, no secretions GI: soft, bs+, NT, foley- darker clear yellow Extremities: warm/dry, no LE edema  Skin: no rashes   Labs pending> H/H 11.8/ 34.9, Na 135, K 3, bicarb 21, alk phos 191> 127, lactic up yest 2.7 UOP 785 ml/ 24hrs (0.5 ml/kg/hr) Net +5.4L   MRSA PCR neg CSF cx> ngtd BC > ngtd  RVP>   Ampicillin/ ctx/ vanc 3/7> 3/8 Unasyn 3/8>   Ancillary tests personally reviewed:    Assessment & Plan:   Acute encephalopathy  w/ left sided weakness/ left hemi-neglect, with concern for seizure/ prolonged post-ictal state  - no prior hx of seizure - imaging/ MRI unrevealing thus far - Biofire meningitis/ encephalitis panel neg, ok w/ neurology to d/c CNS abx coverage - appreciate Neurology input, suspected postitical/ prolonged state.  cEEG per neurology.  Holding on AEDs for now - cont seizure precautions - unclear etiology of neurological symptoms/ exam, and monitor for further BP liability  - cont to minimize  sedation/ PAD protocol, dex/ prn fent - tegretol level low, cont per tube - cont hemodynamic and supportive care  Acute respiratory insufficiency due to above  Left basilar atelectasis vs ?infiltrate - cont full MV support, 4-8cc/kg IBW with goal Pplat <30 and DP<15  - VAP prevention protocol/ PPI - PAD protocol for sedation>minimize Precedex/ fentanyl prn, ativan prn seizure, RASS goal 0/-1 - goal SpO2 >92%  - daily SAT & SBT> hopeful to repeat again with minimizing sedation - no sputum, ongoing fevers - EN per RD  Hypophosphatemia Hypokalemia - recheck phos, s/p KCL replete, check Mag  Hyponatremia, resolved - normalized. Trend on BMET  Sepsis - RVP neg - CNS workup neg, change to aspiration coverage for now - possible aspiration PNA/ LLL, will also check full RVP - goal MAP > 65, BP seems to be liable at times for no apparent reason/ med changes, pending cEEG read but no clinical seizures to correlate.   - noted systolic murmur, check echo - trend WBC/ fever curve - follow cultures - follow UOP/ renal indices closely> renal function remains stable/ UOP acceptable  HLD HTN - hold lopressor, lisinopril for now, caution with liable BP - echo - prn hydralazine  Bipolar  Arthritis - appears to be on oxy at home, ? For prior LUE humeral fx 2/2 fall, unclear timing. Prn fentanyl as above - tegretol level ok   Best Practice (right click and "Reselect all SmartList Selections" daily)   Diet/type: tubefeeds DVT prophylaxis LMWH Pressure ulcer(s): N/A GI prophylaxis: H2B Lines: N/A Foley:  Yes, and it is still needed Code Status:  full code Last date of multidisciplinary goals of care discussion [unable to reach sister who is listed as point of contact.]  Sister updated 3/9 by phone.  Reports she has been sick with some ?viral illness x 1 month.    Total critical care time: 35 minutes   Posey Boyer, MSN, AG-ACNP-BC  Pulmonary [facility] Critical Care 09/27/2023,  8:29 AM  See Amion for pager If no response to pager , please call [phone] until 7pm After 7:00 pm call Elink  [phone]

## 2023-09-27 NOTE — Progress Notes (Signed)
 SLP Cancellation Note  Patient Details Name: NAILA ELIZONDO MRN: [id number] DOB: Jun 03, 1946   Cancelled treatment:       Reason Eval/Treat Not Completed: Patient not medically ready. Intubated.  Ferdinand Lango MA, CCC-SLP    Iraida Cragin Meryl 09/27/2023, 8:37 AM

## 2023-09-27 NOTE — Progress Notes (Signed)
 LTM maint complete - no skin breakdown under:  f8, pz

## 2023-09-27 NOTE — Procedures (Signed)
 Patient Name: Emily Blair  MRN: [id number]  Epilepsy Attending: Charlsie Quest  Referring Physician/Provider: Lynnell Catalan, MD  Duration: 09/26/2023 1137 to 09/27/2023 1137   Patient history: 78 year old female presented with acute onset of left sided weakness, aphasia and left hemineglect. EEG to evaluate for seizure   Level of alertness:  awake, asleep   AEDs during EEG study: CBZ   Technical aspects: This EEG study was done with scalp electrodes positioned according to the 10-20 International system of electrode placement. Electrical activity was reviewed with band pass filter of 1-70Hz , sensitivity of 7 uV/mm, display speed of 82mm/sec with a 60Hz  notched filter applied as appropriate. EEG data were recorded continuously and digitally stored.  Video monitoring was available and reviewed as appropriate.   Description: No clear posterior dominant rhythm was seen. Sleep was characterized by sleep spindles (12 to 40 Hz), maximal frontocentral region. EEG showed continuous generalized and lateralized right hemisphere 3 to 6 Hz theta-delta slowing. Sharply contoured waves were noted in left posterior quadrant. Hyperventilation and photic stimulation were not performed.      ABNORMALITY -Continuous slow generalized and lateralized right hemisphere   IMPRESSION: This study is suggestive of cortical dysfunction in right hemisphere likely secondary to underlying structural abnormality, postictal state.  Additionally there is moderate to severe diffuse encephalopathy.  No seizures or definite epileptiform discharges were seen throughout the recording.   Maijor Hornig [name]

## 2023-09-27 NOTE — Progress Notes (Signed)
 NEUROLOGY CONSULT FOLLOW UP NOTE   Date of service: September 27, 2023 Patient Name: Emily Blair MRN:  [id number] DOB:  [birth year]  Interval Hx/subjective   She is more awake today  Vitals   Vitals:   09/27/23 0800 09/27/23 0817 09/27/23 0828 09/27/23 0900  BP: (!) 148/80   (!) 97/54  Pulse: 74 84 71 68  Resp: 18 17 17 18   Temp: 98.8 F (37.1 C)  99 F (37.2 C) 99.1 F (37.3 C)  TempSrc:    Bladder  SpO2: 100% 100% 100% 100%  Weight:      Height:         Body mass index is 23.94 kg/m.  Physical Exam   General: Intubated and sedated on propofol 40  Neurologic Examination    MS: Eyes open to mild stimulation, she is able to wiggle toes to command, stick out her tongue to command.  When I ask her to show her thumbs she simply opens her hand. CN: Pupils are equal reactive bilaterally, blinks to eyelid stimulation bilaterally Motor: She is able to lift both arms against gravity, but does not cooperate with formal confrontational testing Sensory: As above  Medications  Current Facility-Administered Medications:    0.9 %  sodium chloride infusion, 250 mL, Intravenous, Continuous, Oretha Milch, MD, Held at 09/26/23 2351   acetaminophen (TYLENOL) tablet 650 mg, 650 mg, Per Tube, Q6H PRN, Lynnell Catalan, MD, 650 mg at 09/27/23 0823   albuterol (PROVENTIL) (2.5 MG/3ML) 0.083% nebulizer solution 2.5 mg, 2.5 mg, Nebulization, Q2H PRN, Agarwala, Ravi, MD   Ampicillin-Sulbactam (UNASYN) 3 g in sodium chloride 0.9 % 100 mL IVPB, 3 g, Intravenous, Q6H, Byrum, Les Pou, MD, Stopped at 09/27/23 0518   carBAMazepine (TEGRETOL) 100 MG/5ML suspension 200 mg, 200 mg, Per Tube, BID, Agarwala, Ravi, MD, 200 mg at 09/27/23 4098   Chlorhexidine Gluconate Cloth 2 % PADS 6 each, 6 each, Topical, Daily, Agarwala, Ravi, MD, 6 each at 09/27/23 0417   dexmedetomidine (PRECEDEX) 400 MCG/100ML (4 mcg/mL) infusion, 0-1.2 mcg/kg/hr, Intravenous, Continuous, Simpson, Paula B, NP, Last Rate: 8.81 mL/hr at  09/27/23 0757, 0.5 mcg/kg/hr at 09/27/23 0757   docusate (COLACE) 50 MG/5ML liquid 100 mg, 100 mg, Per Tube, BID, Tilden Fossa, MD, 100 mg at 09/27/23 0823   enoxaparin (LOVENOX) injection 40 mg, 40 mg, Subcutaneous, QHS, Agarwala, Ravi, MD, 40 mg at 09/26/23 2101   famotidine (PEPCID) tablet 20 mg, 20 mg, Per Tube, BID, Agarwala, Ravi, MD, 20 mg at 09/27/23 0823   feeding supplement (OSMOLITE 1.5 CAL) liquid 1,000 mL, 1,000 mL, Per Tube, Q24H, Agarwala, Ravi, MD, 40 mL/hr at 09/26/23 1904   feeding supplement (PROSource TF20) liquid 60 mL, 60 mL, Per Tube, Daily, Agarwala, Ravi, MD, 60 mL at 09/27/23 0823   fentaNYL (SUBLIMAZE) injection 25-100 mcg, 25-100 mcg, Intravenous, Q30 min PRN, Tilden Fossa, MD, 50 mcg at 09/27/23 0423   insulin aspart (novoLOG) injection 0-15 Units, 0-15 Units, Subcutaneous, Q4H, Agarwala, Ravi, MD, 2 Units at 09/27/23 0751   LORazepam (ATIVAN) injection 2 mg, 2 mg, Intravenous, Once PRN, Selmer Dominion B, NP   norepinephrine (LEVOPHED) 4mg  in (0.016 mg/mL) premix infusion, 2-10 mcg/min, Intravenous, Titrated, Oretha Milch, MD, Held at 09/26/23 2225   Oral care mouth rinse, 15 mL, Mouth Rinse, Q2H, Agarwala, Ravi, MD, 15 mL at 09/27/23 0916   Oral care mouth rinse, 15 mL, Mouth Rinse, PRN, Agarwala, Ravi, MD   polyethylene glycol (MIRALAX / GLYCOLAX) packet 17 g, 17 g,  Per Tube, Daily, Tilden Fossa, MD, 17 g at 09/27/23 5284   potassium chloride 10 mEq in 100 mL IVPB, 10 mEq, Intravenous, Q1 Hr x 4, Dela Albin Fischer, MD, Last Rate: 100 mL/hr at 09/27/23 0915, 10 mEq at 09/27/23 0915   thiamine (VITAMIN B1) tablet 100 mg, 100 mg, Per Tube, Daily, Agarwala, Daleen Bo, MD, 100 mg at 09/27/23 0823  Labs and Diagnostic Imaging   CSF RBC 16 CSF WBC one CSF protein 43 CSF glucose 94 BioFire meningitis/encephalitis panel-negative   Imaging(Personally reviewed): MRI and CT perfusion were negative  Assessment   Emily Blair is a 78 y.o. female with  presentation of left-sided weakness and left hemineglect with negative imaging and workup.  My suspicion is she likely did have a seizure with prolonged postictal state.  There has been no other explanation for her acute focal neurological symptoms found otherwise.  I would favor weaning sedation at this time, but will hold off on starting antiepileptics.  I spoke with her nursing home today.  At baseline, she is fairly independent, able to go to the bathroom on her own, etc., but does have dementia.  She is on Tegretol solely for mood stabilization, no history of episodes concerning for seizure.  At this point, I think that we just need to reevaluate her after extubation.  If she has any further episodes concerning for seizure, then we will start antiepileptics.  Recommendations  If no seizures by tomorrow, can discontinue LTM EEG Neurology will continue to follow ______________________________________________________________________   Signed, Ritta Slot, MD [facility]

## 2023-09-28 ENCOUNTER — Inpatient Hospital Stay (HOSPITAL_COMMUNITY)

## 2023-09-28 DIAGNOSIS — D332 Benign neoplasm of brain, unspecified: Secondary | ICD-10-CM | POA: Diagnosis not present

## 2023-09-28 DIAGNOSIS — R4701 Aphasia: Secondary | ICD-10-CM | POA: Diagnosis not present

## 2023-09-28 DIAGNOSIS — R569 Unspecified convulsions: Secondary | ICD-10-CM | POA: Diagnosis not present

## 2023-09-28 DIAGNOSIS — J9601 Acute respiratory failure with hypoxia: Secondary | ICD-10-CM | POA: Diagnosis not present

## 2023-09-28 DIAGNOSIS — R410 Disorientation, unspecified: Secondary | ICD-10-CM | POA: Diagnosis not present

## 2023-09-28 DIAGNOSIS — I480 Paroxysmal atrial fibrillation: Secondary | ICD-10-CM

## 2023-09-28 DIAGNOSIS — R011 Cardiac murmur, unspecified: Secondary | ICD-10-CM

## 2023-09-28 DIAGNOSIS — J690 Pneumonitis due to inhalation of food and vomit: Secondary | ICD-10-CM | POA: Diagnosis not present

## 2023-09-28 DIAGNOSIS — G9340 Encephalopathy, unspecified: Secondary | ICD-10-CM | POA: Diagnosis not present

## 2023-09-28 LAB — GLUCOSE, CAPILLARY
Glucose-Capillary: 117 mg/dL — ABNORMAL HIGH (ref 70–99)
Glucose-Capillary: 121 mg/dL — ABNORMAL HIGH (ref 70–99)
Glucose-Capillary: 125 mg/dL — ABNORMAL HIGH (ref 70–99)
Glucose-Capillary: 122 mg/dL — ABNORMAL HIGH (ref 70–99)

## 2023-09-28 LAB — ECHOCARDIOGRAM COMPLETE
AR max vel: 1.73 cm2
AV Peak grad: 11.8 mmHg
Ao pk vel: 1.72 m/s
Area-P 1/2: 4.18 cm2
Height: 67.992 in
S' Lateral: 1.8 cm
Weight: 2574.97 [oz_av]

## 2023-09-28 LAB — BASIC METABOLIC PANEL
Anion gap: 9 (ref 5–15)
BUN: 12 mg/dL (ref 8–23)
CO2: 23 mmol/L (ref 22–32)
Calcium: 9.7 mg/dL (ref 8.9–10.3)
Chloride: 104 mmol/L (ref 98–111)
Creatinine, Ser: 0.49 mg/dL (ref 0.44–1.00)
GFR, Estimated: >60 mL/min (ref >60)
Glucose, Bld: 122 mg/dL — ABNORMAL HIGH (ref 70–99)
Potassium: 3.6 mmol/L (ref 3.5–5.1)
Sodium: 136 mmol/L (ref 135–145)

## 2023-09-28 LAB — CBC
HCT: 30.6 % — ABNORMAL LOW (ref 36.0–46.0)
Hemoglobin: 10 g/dL — ABNORMAL LOW (ref 12.0–15.0)
MCH: 32.7 pg (ref 26.0–34.0)
MCHC: 32.7 g/dL (ref 30.0–36.0)
MCV: 100 fL (ref 80.0–100.0)
Platelets: 238 10*3/uL (ref 150–400)
RBC: 3.06 MIL/uL — ABNORMAL LOW (ref 3.87–5.11)
RDW: 13.2 % (ref 11.5–15.5)
WBC: 5.7 10*3/uL (ref 4.0–10.5)
nRBC: 0 % (ref 0.0–0.2)

## 2023-09-28 LAB — CSF CULTURE W GRAM STAIN
Culture: NO GROWTH
Gram Stain: NONE SEEN

## 2023-09-28 LAB — PHOSPHORUS: Phosphorus: 2.8 mg/dL (ref 2.5–4.6)

## 2023-09-28 LAB — CYTOLOGY - NON PAP

## 2023-09-28 LAB — MAGNESIUM: Magnesium: 2 mg/dL (ref 1.7–2.4)

## 2023-09-28 MED ORDER — ONDANSETRON HCL 4 MG/2ML IJ SOLN
4.0000 mg | Freq: Four times a day (QID) | INTRAMUSCULAR | Status: DC | PRN
Start: 2023-09-28 — End: 2023-10-03
  Administered 2023-09-28: 4 mg via INTRAVENOUS
  Filled 2023-09-28: qty 2

## 2023-09-28 MED ORDER — INSULIN ASPART 100 UNIT/ML IJ SOLN
0.0000 [IU] | Freq: Three times a day (TID) | INTRAMUSCULAR | Status: DC
  Administered 2023-09-28 – 2023-09-29 (×3): 2 [IU] via SUBCUTANEOUS
  Administered 2023-09-29: 3 [IU] via SUBCUTANEOUS
  Administered 2023-09-29 – 2023-10-01 (×5): 2 [IU] via SUBCUTANEOUS
  Administered 2023-10-01 (×2): 3 [IU] via SUBCUTANEOUS
  Administered 2023-10-02: 2 [IU] via SUBCUTANEOUS
  Administered 2023-10-02: 3 [IU] via SUBCUTANEOUS
  Administered 2023-10-03 (×2): 2 [IU] via SUBCUTANEOUS

## 2023-09-28 MED ORDER — POTASSIUM CHLORIDE 20 MEQ PO PACK
40.0000 meq | PACK | Freq: Once | ORAL | Status: DC
Start: 2023-09-28 — End: 2023-09-28

## 2023-09-28 MED ORDER — POLYETHYLENE GLYCOL 3350 17 G PO PACK
17.0000 g | PACK | Freq: Every day | ORAL | Status: DC
  Administered 2023-09-28 – 2023-10-03 (×5): 17 g via ORAL
  Filled 2023-09-28 (×6): qty 1

## 2023-09-28 MED ORDER — THIAMINE MONONITRATE 100 MG PO TABS
100.0000 mg | ORAL_TABLET | Freq: Every day | ORAL | Status: AC
  Administered 2023-09-28: 100 mg via ORAL

## 2023-09-28 MED ORDER — SENNOSIDES-DOCUSATE SODIUM 8.6-50 MG PO TABS
1.0000 | ORAL_TABLET | Freq: Two times a day (BID) | ORAL | Status: DC
  Administered 2023-09-28 – 2023-10-03 (×10): 1 via ORAL
  Filled 2023-09-28 (×11): qty 1

## 2023-09-28 MED ORDER — POTASSIUM CHLORIDE 10 MEQ/100ML IV SOLN: 10.0000 meq | INTRAVENOUS | Status: DC

## 2023-09-28 MED ORDER — ENSURE ENLIVE PO LIQD
237.0000 mL | Freq: Two times a day (BID) | ORAL | Status: DC
  Administered 2023-09-28 – 2023-10-03 (×9): 237 mL via ORAL

## 2023-09-28 MED ORDER — CARBAMAZEPINE ER 200 MG PO TB12
400.0000 mg | ORAL_TABLET | Freq: Every day | ORAL | Status: DC
  Administered 2023-09-28 – 2023-10-02 (×5): 400 mg via ORAL
  Filled 2023-09-28 (×7): qty 2

## 2023-09-28 MED ORDER — METOPROLOL TARTRATE 25 MG PO TABS
25.0000 mg | ORAL_TABLET | Freq: Two times a day (BID) | ORAL | Status: DC
  Administered 2023-09-28 – 2023-10-01 (×7): 25 mg via ORAL
  Filled 2023-09-28 (×7): qty 1

## 2023-09-28 MED ORDER — TRAZODONE HCL 50 MG PO TABS
50.0000 mg | ORAL_TABLET | ORAL | Status: AC
  Administered 2023-09-28: 50 mg via ORAL
  Filled 2023-09-28: qty 1

## 2023-09-28 MED ORDER — PANTOPRAZOLE SODIUM 40 MG IV SOLR
40.0000 mg | Freq: Two times a day (BID) | INTRAVENOUS | Status: DC
  Administered 2023-09-28 – 2023-09-29 (×3): 40 mg via INTRAVENOUS
  Filled 2023-09-28 (×3): qty 10

## 2023-09-28 MED ORDER — POTASSIUM CHLORIDE 20 MEQ PO PACK
40.0000 meq | PACK | Freq: Once | ORAL | Status: AC
  Administered 2023-09-28: 40 meq
  Filled 2023-09-28: qty 2

## 2023-09-28 NOTE — Progress Notes (Signed)
 vLTM maintenance  All impedances below 10kohm.  No skin breakdown noted at FP1 FP2  F8 T4 T6

## 2023-09-28 NOTE — Progress Notes (Signed)
 [facility] ADULT ICU REPLACEMENT PROTOCOL   The patient does apply for the [facility] Adult ICU Electrolyte Replacment Protocol based on the criteria listed below:   1.Exclusion criteria: TCTS, ECMO, Dialysis, and Myasthenia Gravis patients 2. Is GFR >/= 30 ml/min? Yes.    Patient's GFR today is >60 3. Is SCr </= 2? Yes.   Patient's SCr is 0.49 mg/dL 4. Did SCr increase >/= 0.5 in 24 hours? No. 5.Pt's weight >40kg  Yes.   6. Abnormal electrolyte(s): K  7. Electrolytes replaced per protocol 8.  Call MD STAT for K+ </= 2.5, Phos </= 1, or Mag </= 1 Physician:  Thersa Salt Guled Gahan 09/28/2023 4:15 AM

## 2023-09-28 NOTE — Progress Notes (Signed)
 New transfer for AMS. Patient is AxOx1, RA, scattered bruising noted.  Call bell at reach, bed placed in the lowest position, and bed alarm on.    09/28/23 1842  Vitals  Temp 98.1 F (36.7 C)  Temp Source Oral  BP (!) 150/76  MAP (mmHg) 95  BP Location Right Arm  BP Method Automatic  Patient Position (if appropriate) Lying  Pulse Rate 87  Pulse Rate Source Dinamap  Resp 18  Level of Consciousness  Level of Consciousness Alert  MEWS COLOR  MEWS Score Color Green  Oxygen Therapy  SpO2 99 %  O2 Device Room Air  MEWS Score  MEWS Temp 0  MEWS Systolic 0  MEWS Pulse 0  MEWS RR 0  MEWS LOC 0  MEWS Score 0

## 2023-09-28 NOTE — Progress Notes (Addendum)
 NAME:  Emily Blair, MRN:  [id number], DOB:  12-16-1945, LOS: 3 ADMISSION DATE:  09/25/2023, CONSULTATION DATE: 09/25/2023 REFERRING MD: Madilyn Hook -ED [facility], CHIEF COMPLAINT: Altered mental status  History of Present Illness:  78 year old woman who was sent in via EMS from her assisted living facility for acute left-sided weakness aphasia and left hemineglect starting sometime during the evening hours yesterday.  She had not been behaving normally at that time with some confusion and then was found on the floor early this morning with worsening confusion. Given her focal symptoms of left-sided weakness a code stroke was called but CT perfusion and MRI were both negative for an acute stroke.  While initially no movement on the left side, exam improved somewhat after CT. Due to worsening mental status and concern for airway protection she was electively intubated. Apart from a right arm tremor there was no identified seizure activity. Initial EEG was negative.  She has a history of bipolar affective disorder, hypertension diffuse osteoarthritis.  She is at baseline independent for activities of daily living.   Pertinent  Medical History   Past Medical History:  Diagnosis Date   Arthritis    Bipolar 1 disorder (HCC)    Hypertension    Osteoporosis    History reviewed. No pertinent surgical history.   Significant Hospital Events: Including procedures, antibiotic start and stop dates in addition to other pertinent events   3/7-admitted to hospital for AMS.  MRI with contrast unremarkable, negative spot EEG.  3/7 LP performed.  CSF clear and colorless. 3/9 Liable BP x 2 overnight for no apparent good reason No clinical seizures, remains on cEEG Remains low dose precedex, following commands this morning, but still some ?left neglect and LUE weakness, failed SBT shortly due to apnea. Tmax overnight 101.5, WBC improved.  Then was successfully extubated later that afternoon 3/10 awake oriented x 1  did have a bout of emesis  Interim History / Subjective:  No distress Objective   Blood pressure (!) 158/86, pulse 90, temperature (!) 97.4 F (36.3 C), temperature source Oral, resp. rate 16, height 5' 7.99" (1.727 m), weight 73 kg, SpO2 96%.    Vent Mode: PSV;CPAP FiO2 (%):  [35 %] 35 % Set Rate:  [18 bmp] 18 bmp Vt Set:  [510 mL] 510 mL PEEP:  [5 cmH20] 5 cmH20 Pressure Support:  [5 cmH20-8 cmH20] 5 cmH20 Plateau Pressure:  [13 cmH20] 13 cmH20   Intake/Output Summary (Last 24 hours) at 09/28/2023 0828 Last data filed at 09/28/2023 0600 Gross per 24 hour  Intake 1068.24 ml  Output 800 ml  Net 268.24 ml   Filed Weights   09/26/23 0215 09/27/23 0423 09/28/23 0412  Weight: 70.5 kg 71.4 kg 73 kg    Examination: General 78 year old female patient lying in bed she is in no acute distress this morning HEENT normocephalic atraumatic no jugular venous distention is appreciated.  LTM apparatus is in place Pulmonary: Clear diminished bases currently on room air no accessory use appreciated Cardiac: Irregular irregular.  Appears to be atrial fibrillation on telemetry with RVR at times Abdomen soft not tender bowel sounds are present she did just have a bout of emesis prior to a.m. rounds GU clear yellow Neuro awake oriented x 1, perseverates at times, confused, left-sided weakness and neglect Ext warm and dry no edema   Resolved problem list   Hyponatremia Hypophosphatemia Hypokalemia Sepsis   Assessment & Plan:   Acute encephalopathy w/ left sided weakness/ left hemi-neglect, with concern  for seizure/ prolonged post-ictal state  - no prior hx of seizure - imaging/ MRI unrevealing thus far - Biofire meningitis/ encephalitis panel neg, ok w/ neurology to d/c CNS abx coverage Working diagnosis at this point remains prolonged postictal state Plan LTM per neurology Continuing Tegretol she is actually on this for mood stabilization Seizure precautions Serial neurochecks  Acute  respiratory failure 2/2 aspiration PNA  Culture data negative to date  Extubated 3/9, now on room air  plan Cont pulse ox  Asp precautions  Day #4 antibiotics, day #3 Unasyn, will complete 5 days for aspiration  Paroxysmal atrial fibrillation. -This is new. Click Here to Calculate/Change CHADS2VASc Score The patient's CHADS2-VASc score is 4,  indicating a 4.8% annual risk of stroke.  Therefore, anticoagulation is recommended.   Plan Continuing telemetry Obtaining twelve-lead Ensure magnesium is greater than 2 and potassium is greater than 4 Will add back her Lopressor, this was for hypertension As we are unsure about the etiology of her mental status change and weakness I think we should hold off from systemic anticoagulation for now Follow-up echocardiogram  Emesis. Has good bowel sounds.  Abdomen soft.  Wonder if this is just medication related Plan Had PPI As needed Zofran I think we can still go ahead and advance diet  Fluid and electrolyte imbalance: intermittent  Plan Monitor and replace as needed   History of HTN & HLD Plan  hold  lisinopril for now, caution with liable BP  Bipolar and chronic pain & Arthritis - appears to be on oxy at home, ? For prior LUE humeral fx 2/2 fall, unclear timing.  Plan Discontinue fentanyl Cont Tegretol  Best Practice (right click and "Reselect all SmartList Selections" daily)   Diet/type: Regular consistency (see orders) DVT prophylaxis LMWH Pressure ulcer(s): N/A GI prophylaxis: H2B Lines: N/A Foley:  Yes, and it is still needed Code Status:  full code Last date of multidisciplinary goals of care discussion [unable to reach sister who is listed as point of contact.]  My time 38 min  Not CCM time  Simonne Martinet ACNP-BC [facility] Pulmonary/Critical Care Pager # [phone] OR # [phone] if no answer

## 2023-09-28 NOTE — Progress Notes (Signed)
 LTM EEG disconnected - no skin breakdown at [facility].

## 2023-09-28 NOTE — Plan of Care (Signed)
  Problem: Safety: Goal: Ability to remain free from injury will improve Outcome: Not Met (add Reason)   Problem: Skin Integrity: Goal: Risk for impaired skin integrity will decrease Outcome: Not Met (add Reason)   Problem: Elimination: Goal: Will not experience complications related to bowel motility Outcome: Not Met (add Reason) Goal: Will not experience complications related to urinary retention Outcome: Not Met (add Reason)   Problem: Education: Goal: Knowledge of General Education information will improve Description: Including pain rating scale, medication(s)/side effects and non-pharmacologic comfort measures Outcome: Not Met (add Reason)

## 2023-09-28 NOTE — Progress Notes (Signed)
 Echocardiogram 2D Echocardiogram has been performed.  Emily Blair 09/28/2023, 10:47 AM

## 2023-09-28 NOTE — Evaluation (Signed)
 Physical Therapy Evaluation Patient Details Name: Emily Blair MRN: [id number] DOB: 1945/09/10 Today's Date: 09/28/2023  History of Present Illness  Pt is 78 year old presented to [facility] on  09/25/23 for lt sided weakness, lt neglect and aphasia. CT, MRI, lumbar puncture negative.Suspicion is for unwitnessed seizure with prolonged postictal state. Intubated 3/7 for airway protection and extubated 3/9. PMH - dementia, arthritis, bipolar, htn, humerus fx  Clinical Impression  Pt admitted with above diagnosis and presents to PT with functional limitations due to deficits listed below (See PT problem list). Pt needs skilled PT to maximize independence and safety. Pt limited to sitting EOB today with min to mod assist. Poor cognition, poor balance and lt inattention limiting mobility more than weakness. Patient will benefit from continued inpatient follow up therapy, <3 hours/day.           If plan is discharge home, recommend the following: A lot of help with walking and/or transfers;A lot of help with bathing/dressing/bathroom;Direct supervision/assist for medications management;Direct supervision/assist for financial management;Assistance with feeding;Assist for transportation   Can travel by private vehicle   No    Equipment Recommendations Other (comment) (To be determined at next venue)  Recommendations for Other Services       Functional Status Assessment Patient has had a recent decline in their functional status and demonstrates the ability to make significant improvements in function in a reasonable and predictable amount of time.     Precautions / Restrictions Precautions Precautions: Fall Recall of Precautions/Restrictions: Impaired Precaution/Restrictions Comments: lt inattention Restrictions Weight Bearing Restrictions Per Provider Order: No      Mobility  Bed Mobility Overal bed mobility: Needs Assistance Bed Mobility: Supine to Sit, Sit to Supine     Supine to sit: Mod  assist, HOB elevated Sit to supine: Min assist   General bed mobility comments: Assist to initiate movement and to bring legs off, elevate trunk into sitting. Pt sitting angled to the rt. Assist to bring legs back up into bed.    Transfers                   General transfer comment: Did not attempt with 1 person assist    Ambulation/Gait                  Stairs            Wheelchair Mobility     Tilt Bed    Modified Rankin (Stroke Patients Only)       Balance Overall balance assessment: Needs assistance Sitting-balance support: Bilateral upper extremity supported, Single extremity supported, Feet supported Sitting balance-Leahy Scale: Poor Sitting balance - Comments: min to mod assist for static sitting with pt rotated to the rt Postural control: Right lateral lean, Posterior lean                                   Pertinent Vitals/Pain Pain Assessment Pain Assessment: Faces Faces Pain Scale: No hurt    Home Living Family/patient expects to be discharged to:: Skilled nursing facility                   Additional Comments: Pt was at a facility. ALF vs SNF    Prior Function Prior Level of Function : Patient poor historian/Family not available             Mobility Comments: Per facility staff pt independent with mobility  ADLs Comments: Independent with toileting per staff. Unsure of other ADL's     Extremity/Trunk Assessment   Upper Extremity Assessment Upper Extremity Assessment: Defer to OT evaluation    Lower Extremity Assessment Lower Extremity Assessment: Generalized weakness;Difficult to assess due to impaired cognition       Communication   Communication Communication: Impaired Factors Affecting Communication: Difficulty expressing self    Cognition Arousal: Alert Behavior During Therapy: Restless   PT - Cognitive impairments: Difficult to assess, Attention, Initiation, Problem solving,  Sequencing, Awareness, Safety/Judgement Difficult to assess due to: Impaired communication                     PT - Cognition Comments: Poor initiation, poor sequencing, lt inattention. Following commands: Impaired Following commands impaired: Follows one step commands inconsistently, Follows one step commands with increased time     Cueing Cueing Techniques: Verbal cues, Gestural cues, Tactile cues     General Comments General comments (skin integrity, edema, etc.): VSS on RA    Exercises     Assessment/Plan    PT Assessment Patient needs continued PT services  PT Problem List Decreased strength;Decreased activity tolerance;Decreased balance;Decreased mobility;Decreased cognition;Decreased safety awareness       PT Treatment Interventions DME instruction;Gait training;Functional mobility training;Therapeutic activities;Therapeutic exercise;Balance training;Neuromuscular re-education;Cognitive remediation;Patient/family education    PT Goals (Current goals can be found in the Care Plan section)  Acute Rehab PT Goals Patient Stated Goal: unable to state PT Goal Formulation: Patient unable to participate in goal setting Time For Goal Achievement: 10/12/23 Potential to Achieve Goals: Fair    Frequency Min 2X/week     Co-evaluation               AM-PAC PT "6 Clicks" Mobility  Outcome Measure Help needed turning from your back to your side while in a flat bed without using bedrails?: A Lot Help needed moving from lying on your back to sitting on the side of a flat bed without using bedrails?: A Lot Help needed moving to and from a bed to a chair (including a wheelchair)?: Total Help needed standing up from a chair using your arms (e.g., wheelchair or bedside chair)?: Total Help needed to walk in hospital room?: Total Help needed climbing 3-5 steps with a railing? : Total 6 Click Score: 8    End of Session   Activity Tolerance: Patient tolerated treatment  well Patient left: in bed;with call bell/phone within reach;with bed alarm set;with nursing/sitter in room Nurse Communication: Mobility status PT Visit Diagnosis: Unsteadiness on feet (R26.81);Other abnormalities of gait and mobility (R26.89);Muscle weakness (generalized) (M62.81);Difficulty in walking, not elsewhere classified (R26.2);Other symptoms and signs involving the nervous system (R29.898)    Time: 9629-5284 PT Time Calculation (min) (ACUTE ONLY): 17 min   Charges:   PT Evaluation $PT Eval Moderate Complexity: 1 Mod   PT General Charges $$ ACUTE PT VISIT: 1 Visit         [facility] PT Acute Rehabilitation Services Office [phone]   Angelina Ok [facility] 09/28/2023, 1:36 PM

## 2023-09-28 NOTE — Progress Notes (Signed)
 Nutrition Follow-up  DOCUMENTATION CODES:  Not applicable  INTERVENTION:  Continue regular diet as ordered Ensure Enlive po BID, each supplement provides 350 kcal and 20 grams of protein. Magic cup TID with meals, each supplement provides 290 kcal and 9 grams of protein  NUTRITION DIAGNOSIS:  Inadequate oral intake related to acute illness as evidenced by NPO status. - progressing, diet advanced to regular   GOAL:  Patient will meet greater than or equal to 90% of their needs - goal unmet, diet just advanced this morning  MONITOR:  Labs, Weight trends, Vent status, TF tolerance  REASON FOR ASSESSMENT:  Consult, Ventilator Enteral/tube feeding initiation and management, Assessment of nutrition requirement/status  ASSESSMENT:  Pt admitted from ALF with acute L-sided weakness, aphasia and L hemi neglect as well as confusion. PMH significant for arthritis, bipolar 1 disorder, HTN.  3/7: admitted, intubated, MRI unremarkable, s/p LP to r/o meningitis  3/9: extubated 3/10: diet advanced to regular, thin liquids  Pt remains in prolonged post-ictal state.  Pt oriented x1 today.   Noted pt had an episode of emesis today. Zofran added.   Met with pt at bedside. No family present at time of visit. She is pleasantly confused attempting to take RD papers as her own.  RD to add nutrition supplements to augment intake.     Admit weight: 68.5 kg  Current weight: 73 kg  Medications: SSI 0-15 units TID, miralax daily, senna BID, thiamine, IV abx  Labs:  CBG's 117-135 x24 hours  Diet Order:   Diet Order             Diet regular Room service appropriate? Yes; Fluid consistency: Thin  Diet effective now                   EDUCATION NEEDS:  No education needs have been identified at this time  Skin:  Skin Assessment: Reviewed RN Assessment  Last BM:  3/9  Height:  Ht Readings from Last 1 Encounters:  09/25/23 5' 7.99" (1.727 m)    Weight:  Wt Readings from Last 1  Encounters:  09/28/23 73 kg   BMI:  Body mass index is 24.48 kg/m.  Estimated Nutritional Needs:   Kcal:  1700-1900  Protein:  85-100g  Fluid:  >/=1.7L  Drusilla Kanner, RDN, LDN Clinical Nutrition

## 2023-09-28 NOTE — Progress Notes (Signed)
 NEUROLOGY CONSULT FOLLOW UP NOTE   Date of service: September 28, 2023 Patient Name: Emily Blair MRN:  [id number] DOB:  08/13/1945  Interval Hx/subjective   Patient was extubated last night. Awake, confused, baseline left-sided weakness. LTM with no seizures.   Vitals   Vitals:   09/28/23 0412 09/28/23 0500 09/28/23 0600 09/28/23 0700  BP: (!) 135/94 (!) 144/88 (!) 159/85 (!) 158/86  Pulse: 89 90 82 90  Resp: 18 (!) 21 20 16   Temp:      TempSrc:      SpO2: 93% 95% 96% 96%  Weight: 73 kg     Height:         Body mass index is 24.48 kg/m.  Physical Exam   General: elderly female, lying in bed.  CV: SR/ST on monitor Resp: Unlabored, room air.   Neurologic Examination   Neuro: Mental Status: Patient is awake and alert.  She is oriented to self, able to say she lives in [place].  She states she is in [place] but cannot state what type of building she is in.  Disoriented to time, age.  No dysarthria.  She follows simple commands with repeated instructions.  Cranial Nerves: II: Visual Fields are full. Pupils are equal, round, and reactive to light.   III,IV, VI: EOMI without ptosis or diploplia.  V: Facial sensation is symmetric to light touch. VII: Facial movement is symmetric.  VIII: hearing is intact to voice X: Uvula elevates symmetrically XI: Shoulder shrug is symmetric. XII: Midline tongue protrusion Motor: Tone is normal. Bulk is normal.  Generalized weakness, with baseline left-sided weakness seen.  Sensory: Sensation is symmetric to light touch in the arms and legs. Cerebellar: FNF intact but slow bilaterally, needs multiple instructions to complete task  Medications  Current Facility-Administered Medications:    acetaminophen (TYLENOL) tablet 650 mg, 650 mg, Per Tube, Q6H PRN, Lynnell Catalan, MD, 650 mg at 09/27/23 0823   albuterol (PROVENTIL) (2.5 MG/3ML) 0.083% nebulizer solution 2.5 mg, 2.5 mg, Nebulization, Q2H PRN, Agarwala, Ravi, MD    Ampicillin-Sulbactam (UNASYN) 3 g in sodium chloride 0.9 % 100 mL IVPB, 3 g, Intravenous, Q6H, Byrum, Les Pou, MD, Stopped at 09/28/23 0444   carBAMazepine (TEGRETOL) 100 MG/5ML suspension 200 mg, 200 mg, Per Tube, BID, Agarwala, Ravi, MD, 200 mg at 09/27/23 2230   Chlorhexidine Gluconate Cloth 2 % PADS 6 each, 6 each, Topical, Daily, Agarwala, Ravi, MD, 6 each at 09/27/23 2230   dexmedetomidine (PRECEDEX) 400 MCG/100ML (4 mcg/mL) infusion, 0-1.2 mcg/kg/hr, Intravenous, Continuous, Selmer Dominion B, NP, Stopped at 09/27/23 1630   enoxaparin (LOVENOX) injection 40 mg, 40 mg, Subcutaneous, QHS, Agarwala, Ravi, MD, 40 mg at 09/27/23 2227   famotidine (PEPCID) tablet 20 mg, 20 mg, Per Tube, BID, Agarwala, Ravi, MD, 20 mg at 09/27/23 2226   feeding supplement (OSMOLITE 1.5 CAL) liquid 1,000 mL, 1,000 mL, Per Tube, Q24H, Agarwala, Ravi, MD, 1,000 mL at 09/27/23 1248   feeding supplement (PROSource TF20) liquid 60 mL, 60 mL, Per Tube, Daily, Agarwala, Ravi, MD, 60 mL at 09/27/23 0823   fentaNYL (SUBLIMAZE) injection 25-100 mcg, 25-100 mcg, Intravenous, Q30 min PRN, Tilden Fossa, MD, 50 mcg at 09/27/23 0423   insulin aspart (novoLOG) injection 0-15 Units, 0-15 Units, Subcutaneous, Q4H, Agarwala, Ravi, MD, 2 Units at 09/28/23 0015   LORazepam (ATIVAN) injection 2 mg, 2 mg, Intravenous, Once PRN, Selmer Dominion B, NP   norepinephrine (LEVOPHED) 4mg  in (0.016 mg/mL) premix infusion, 2-10 mcg/min, Intravenous, Titrated, Cyril Mourning  V, MD, Held at 09/26/23 2225   polyethylene glycol (MIRALAX / GLYCOLAX) packet 17 g, 17 g, Per Tube, Daily, Tilden Fossa, MD, 17 g at 09/27/23 9604   senna-docusate (Senokot-S) tablet 1 tablet, 1 tablet, Per Tube, BID, Selmer Dominion B, NP, 1 tablet at 09/27/23 2227   thiamine (VITAMIN B1) tablet 100 mg, 100 mg, Per Tube, Daily, Agarwala, Daleen Bo, MD, 100 mg at 09/27/23 0823  Labs and Diagnostic Imaging   CSF RBC 16 CSF WBC one CSF protein 43 CSF glucose 94 CSF  culture: Prelim Negative [employer] meningitis/encephalitis panel-negative VDRL: Pending  I-stat Lactic Acid: 2.2 Blood Culture: Prelim Negative  Imaging(Personally reviewed): MRI and CT perfusion were negative  Assessment   Emily Blair is a 78 y.o. female with presentation of left-sided weakness and left hemineglect with negative imaging and workup.  My suspicion is she likely did have a seizure with prolonged postictal state.  There has been no other explanation for her acute focal neurological symptoms found otherwise. CT/MRI showed no acute abnormality. 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 Amada Jupiter spoke with facility 3/9.  At baseline, she is fairly independent, able to go to the bathroom on her own, etc., but does have dementia.  Appears she is on oxycodone BID at home, recommend weaning this down as able. She has prior or fall and LUE humeral fracture. Denied pain on assessment today, but patient is confused at baseline, unsure of reliability of her as a historian. She is on Tegretol solely for mood stabilization, no history of episodes concerning for seizure. Tegretol level 3/8 was sub-therapeutic.   On exam today, she is confused and follows simple commands. 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 LTM negative for seizures, but did show evidence of epileptogenicity.   Impression: Seizure with prolonged post-ictal state in patient with no known history of epilepsy secondary to low tegretol level/therefore questionable compliance, infectious process (aspiration pneumonia) lowering seizure threshold, ? secondary to intracranial Dermoid cyst.   Recommendations   - Discontinue LTM EEG - Continue seizure precautions - Continue home Tegretol - OK to d/c CNS abx coverage - Continue electrolyte management, as you are.  - Avoid deliriogenic medications, including weaning down oxycodone at home.  - Recommend outpatient follow-up for Dermoid monitoring,  repeat imaging per outpatient provider  Neurology to sign off; please re-engage if additional neurologic concerns arise.  ______________________________________________________________________    Pt seen by Neuro NP/APP and later by MD. Note/plan to be edited by MD as needed.    Lynnae January, DNP, AGACNP-BC [facility] Please use AMION for contact information & EPIC for messaging.   Attending Neurohospitalist Addendum Patient seen and examined with APP/Resident. Agree with the history and physical as documented above. Agree with the plan as documented, which I helped formulate. I have edited the note above to reflect my full findings and recommendations. I have independently reviewed the chart, obtained history, review of systems and examined the patient.I have personally reviewed pertinent head/neck/spine imaging (CT/MRI). Please feel free to call with any questions.  -- Bing Neighbors, MD [facility] [phone]  If 7pm- 7am, please page neurology on call as listed in AMION.

## 2023-09-28 NOTE — Progress Notes (Signed)
 SLP Cancellation Note  Patient Details Name: Emily Blair MRN: [id number] DOB: 1946/03/13   Cancelled treatment:       Reason Eval/Treat Not Completed: Patient at procedure or test/unavailable. Having procedure in room then technician to take EEG leads off. Will continue efforts.    Royce Macadamia 09/28/2023, 10:13 AM

## 2023-09-28 NOTE — Procedures (Addendum)
 Patient Name: Emily Blair  MRN: [id number]  Epilepsy Attending: Charlsie Quest  Referring Physician/Provider: Lynnell Catalan, MD  Duration: 09/27/2023 1137 to 09/28/2023 1012   Patient history: 78 year old female presented with acute onset of left sided weakness, aphasia and left hemineglect. EEG to evaluate for seizure   Level of alertness:  awake, asleep   AEDs during EEG study: CBZ   Technical aspects: This EEG study was done with scalp electrodes positioned according to the 10-20 International system of electrode placement. Electrical activity was reviewed with band pass filter of 1-70Hz , sensitivity of 7 uV/mm, display speed of 90mm/sec with a 60Hz  notched filter applied as appropriate. EEG data were recorded continuously and digitally stored.  Video monitoring was available and reviewed as appropriate.   Description: The posterior dominant rhythm consists of 9-10 Hz activity of moderate voltage (25-35 uV) seen predominantly in posterior head regions, symmetric and reactive to eye opening and eye closing.  Sleep was characterized by sleep spindles (12 to 40 Hz), maximal frontocentral region. EEG showed intermittent generalized 3 to 6 Hz theta-delta slowing admixed with 13-15hz  beta activity. Hyperventilation and photic stimulation were not performed.      ABNORMALITY - Intermittent slow, generalized    IMPRESSION: This study is suggestive of mild diffuse encephalopathy.  No seizures or definite epileptiform discharges were seen throughout the recording.   [name] [name]

## 2023-09-29 DIAGNOSIS — J9601 Acute respiratory failure with hypoxia: Secondary | ICD-10-CM | POA: Diagnosis not present

## 2023-09-29 DIAGNOSIS — R4182 Altered mental status, unspecified: Secondary | ICD-10-CM | POA: Diagnosis not present

## 2023-09-29 DIAGNOSIS — G9340 Encephalopathy, unspecified: Secondary | ICD-10-CM | POA: Diagnosis not present

## 2023-09-29 LAB — CBC
HCT: 33 % — ABNORMAL LOW (ref 36.0–46.0)
Hemoglobin: 11.2 g/dL — ABNORMAL LOW (ref 12.0–15.0)
MCH: 32.8 pg (ref 26.0–34.0)
MCHC: 33.9 g/dL (ref 30.0–36.0)
MCV: 96.8 fL (ref 80.0–100.0)
Platelets: 289 10*3/uL (ref 150–400)
RBC: 3.41 MIL/uL — ABNORMAL LOW (ref 3.87–5.11)
RDW: 12.6 % (ref 11.5–15.5)
WBC: 6.6 10*3/uL (ref 4.0–10.5)
nRBC: 0 % (ref 0.0–0.2)

## 2023-09-29 LAB — BASIC METABOLIC PANEL
Anion gap: 11 (ref 5–15)
BUN: 7 mg/dL — ABNORMAL LOW (ref 8–23)
CO2: 27 mmol/L (ref 22–32)
Calcium: 10.2 mg/dL (ref 8.9–10.3)
Chloride: 93 mmol/L — ABNORMAL LOW (ref 98–111)
Creatinine, Ser: 0.58 mg/dL (ref 0.44–1.00)
GFR, Estimated: >60 mL/min (ref >60)
Glucose, Bld: 133 mg/dL — ABNORMAL HIGH (ref 70–99)
Potassium: 3 mmol/L — ABNORMAL LOW (ref 3.5–5.1)
Sodium: 131 mmol/L — ABNORMAL LOW (ref 135–145)

## 2023-09-29 LAB — HEPATIC FUNCTION PANEL
ALT: 13 U/L (ref 0–44)
AST: 21 U/L (ref 15–41)
Albumin: 3 g/dL — ABNORMAL LOW (ref 3.5–5.0)
Alkaline Phosphatase: 122 U/L (ref 38–126)
Bilirubin, Direct: 0.2 mg/dL (ref 0.0–0.2)
Indirect Bilirubin: 0.7 mg/dL (ref 0.3–0.9)
Total Bilirubin: 0.9 mg/dL (ref 0.0–1.2)
Total Protein: 6.6 g/dL (ref 6.5–8.1)

## 2023-09-29 LAB — GLUCOSE, CAPILLARY
Glucose-Capillary: 139 mg/dL — ABNORMAL HIGH (ref 70–99)
Glucose-Capillary: 129 mg/dL — ABNORMAL HIGH (ref 70–99)
Glucose-Capillary: 155 mg/dL — ABNORMAL HIGH (ref 70–99)
Glucose-Capillary: 153 mg/dL — ABNORMAL HIGH (ref 70–99)

## 2023-09-29 LAB — MAGNESIUM: Magnesium: 1.8 mg/dL (ref 1.7–2.4)

## 2023-09-29 LAB — VDRL, CSF: VDRL Quant, CSF: NONREACTIVE

## 2023-09-29 LAB — PHOSPHORUS: Phosphorus: 2.4 mg/dL — ABNORMAL LOW (ref 2.5–4.6)

## 2023-09-29 MED ORDER — LEVALBUTEROL HCL 0.63 MG/3ML IN NEBU: 0.6300 mg | INHALATION_SOLUTION | Freq: Four times a day (QID) | RESPIRATORY_TRACT | Status: DC | PRN

## 2023-09-29 MED ORDER — IPRATROPIUM BROMIDE 0.02 % IN SOLN: 0.5000 mg | Freq: Four times a day (QID) | RESPIRATORY_TRACT | Status: DC | PRN

## 2023-09-29 MED ORDER — MAGNESIUM SULFATE 2 GM/50ML IV SOLN
2.0000 g | Freq: Once | INTRAVENOUS | Status: AC
  Administered 2023-09-29: 2 g via INTRAVENOUS
  Filled 2023-09-29: qty 50

## 2023-09-29 MED ORDER — K PHOS MONO-SOD PHOS DI & MONO 155-852-130 MG PO TABS
500.0000 mg | ORAL_TABLET | Freq: Once | ORAL | Status: AC
  Administered 2023-09-29: 500 mg via ORAL
  Filled 2023-09-29: qty 2

## 2023-09-29 MED ORDER — PANTOPRAZOLE SODIUM 40 MG PO TBEC
40.0000 mg | DELAYED_RELEASE_TABLET | Freq: Two times a day (BID) | ORAL | Status: DC
  Administered 2023-09-29 – 2023-10-03 (×8): 40 mg via ORAL
  Filled 2023-09-29 (×8): qty 1

## 2023-09-29 MED ORDER — POTASSIUM CHLORIDE CRYS ER 20 MEQ PO TBCR
40.0000 meq | EXTENDED_RELEASE_TABLET | Freq: Two times a day (BID) | ORAL | Status: AC
  Administered 2023-09-29 – 2023-09-30 (×2): 40 meq via ORAL
  Filled 2023-09-29 (×2): qty 2

## 2023-09-29 NOTE — Hospital Course (Addendum)
 The patient is a 78 year old Caucasian female who was sent from her assisted living facility due to acute left-sided weakness, aphasia and left hemineglect.  Facility noted that she had not been behaving abnormally at that time and had some confusion and was found on the floor.  She had some focal symptoms of left-sided weakness so code stroke was called but CT perfusion and MRI were both negative for acute stroke.    She initially had no movement on the left side but her examination improved after his CT scan.  Due to her worsening mental status and concern for airway protection she is electively intubated.  She underwent extensive neurological workup and LP was performed and she had some labile blood pressures overnight.  She was placed on continuous EEG and had no clinical seizures.  She was placed on low-dose Precedex and and initially failed her SBT due to apnea. Spiked a Temperature then extubated on 09/27/2023 and transferred to the [facility] service on 09/29/2023.  She continues to be persistently confused but despite this Neuro has signed off the case but requested they re-evaluate.   Currently electrolytes are being replaced. PT/OT recommending SNF and will need Bed and Insurance Auth.   Assessment and Plan:  Acute Encephalopathy w/ left sided weakness/ left hemi-neglect, with concern for seizure/ prolonged post-ictal state: No prior hx of seizure and  workup is negative with imaging/ MRI unrevealing thus far. Biofire meningitis/ encephalitis panel neg, ok w/ neurology to d/c CNS abx coverage. Working diagnosis at this point remains prolonged postictal state and ? 2/2 to Intracranial Dermoid Cyst.  She is status post LTM EEG monitoring and this has now been discontinued.  Neurology was recommending seizure precautions and continue home Carbamazepine 400 mg po qHS and managing and optimizing electrolyte management with serial neurochecks. C/w IV Lorazepam 2 mg ONCEprn for Seizures -Neurology is also  recommending avoiding deliriogenic medications and weaning down her home oxycodone and following up in outpatient setting for Intracranial Dermoid Cyst monitoring.  -Per report at baseline she is fairly independent and lives in an ALF and able to go to the bathroom on her own but does have dementia. (Unclear how bad of dementia she has)  She also has a history of falls and left upper humeral fracture; Given her persistent confusion have asked Neuro to evaluate her again and they will see in the AM -PT/OT to evaluate and Treat and recommending SNF and TOC to assist with Placement to obtain Insurance Auth and Bed -C/w Delirium Precautions   Acute Respiratory Failure 2/2 Aspiration PNA: Culture data negative to date. Extubated 3/9, now on room air.  Continue to monitor respiratory status carefully and placed on aspiration precautions along with continuous pulse oximetry.  She is on day 5 of antibiotics and Day 4 for IV Unasyn so we will complete 5 days of Unasyn for aspiration. SpO2: 99 %; O2 Flow Rate (L/min): 3 L/min, FiO2 (%): 35 %. Repeat CXR in the AM and start Xopenex/Atrovent PRN; C/w Aspiration Precautions  ? Paroxysmal Atrial Fibrillation: Per PCCM New onset and CHA2DS2-VASc score of 4 and anticoagulation is recommended.  Optimize electrolytes to ensure magnesium greater than 2 and K+ > 4 and Mag >2.  Unclear etiology of this. EKG that was obtained reviewed and shows NSR w/ PACs. ECHO done and showed EF of 70-75% with no regional wall motion abnormalities and a left ventricular diastolic parameters being normal. C/w Metoprolol Tartrate 25 mg po BID. AC deferred by PCCM due to unclear  etiology of her Mental Status change and weakness.  Will continue to defer anticoagulation given her history of falls and may need to discuss with Cardiology about further recommendations but appears she is back in Sinus  Emesis: ? Medication related.  Continue with PPI and IV ondansetron 4 mg every 6 as needed and diet  has been advanced.  History of HTN Held Lisinopril 20 mg for now, caution with liable BP. Metoprolol Tartrate 25 mg po BID resumed. CTM BP per Protocol. Last BP reading was 136/79  HLD: Simvastatin 10 mg po Daily also held   Bipolar and chronic pain & Arthritis: Appeears to be on oxy at home and being held, ? For prior LUE humeral fx 2/2 fall, unclear timing. Discontinued Fentanyl.   HypoK+: K+ was 3.0. Replete with p.o. KCl 40 mEq twice daily x 2 doses. CTM and Replete as Necessary. Repeat CMP in the AM  HypoPhos: Phos 2.4. Replete with po K Phos Neutral 500 mg x1. CTM and Replete as Necessary  Hx of Bipolar 1: C/w Carbamazepine for Mood Stabilization   GERD/GI Prophylaxis: C/w PPI Pantoprazole 40 mg BID  Normocytic Anemia: Hgb/Hct is 11.2/33.0. Check Anemia Panel in the AM. CTM for S/Sx of Bleeding; No overt bleeding noted. Repeat CBC in AM  Hypoalbuminemia: Patient's Albumin is now 3.0. CTM and Trend and repeat CMP in the AM

## 2023-09-29 NOTE — Evaluation (Signed)
 Speech Language Pathology Evaluation Patient Details Name: Emily Blair MRN: [id number] DOB: 1945/09/08 Today's Date: 09/29/2023 Time: 4098-1191 SLP Time Calculation (min) (ACUTE ONLY): 20 min  Problem List:  Patient Active Problem List   Diagnosis Date Noted   Altered mental status 09/27/2023   Acute respiratory failure (HCC) 09/26/2023   Meningitis 09/25/2023   Past Medical History:  Past Medical History:  Diagnosis Date   Arthritis    Bipolar 1 disorder (HCC)    Hypertension    Osteoporosis    Past Surgical History: History reviewed. No pertinent surgical history. HPI:  78 year old woman who was sent in via EMS from her assisted living facility for acute left-sided weakness aphasia and left hemineglect and found on the floor.  CT perfusion and MRI were both negative for an acute stroke. Intubated 3/7-3/9. Found to have encephalopathy with left sided weakness/left hemi-neglect with concern for seizure/prolonged post-ictal state. PMH - dementia, arthritis, bipolar, htn, humerus fx   Assessment / Plan / Recommendation Clinical Impression  Pt has history of dementia per chart and appears to be functioning close to baseline status. SLP called sister, Corrie Dandy who confirmed her memory deficits but denied the diagnosis of "dementia." Corrie Dandy stated "everything changed last March after her fall and sometimes wouldn't talk to family." Pt is only oriented to self and was unable to retain information pertaining to place, city throughout assessment continuing to state she is in [place]. Pt's with dementia sometimes have difficulty with language/word finding/semantics and pt is exhibiting difficulty in these areas intermittently. She was able to state basic problem solving questions however has difficulty in functional situations as documented in PT session. SLP is not recommending further ST intervention and she requires 24 hour supervision at discharge. If symptoms worsen she may benefit from ST at next  venue of care primarily to educate family if warranted.    SLP Assessment  SLP Recommendation/Assessment: Patient does not need any further Speech Lanaguage Pathology Services (appears to be at or close to baseline) SLP Visit Diagnosis: Cognitive communication deficit (R41.841)    Recommendations for follow up therapy are one component of a multi-disciplinary discharge planning process, led by the attending physician.  Recommendations may be updated based on patient status, additional functional criteria and insurance authorization.    Follow Up Recommendations  No SLP follow up    Assistance Recommended at Discharge     Functional Status Assessment Patient has not had a recent decline in their functional status  Frequency and Duration           SLP Evaluation Cognition  Overall Cognitive Status: History of cognitive impairments - at baseline Arousal/Alertness: Awake/alert Orientation Level: Oriented to person;Disoriented to place;Disoriented to time;Disoriented to situation Year: Other (Comment) (2005) Month: November Attention: Sustained Sustained Attention: Impaired Sustained Attention Impairment: Verbal basic Memory: Impaired Memory Impairment: Decreased recall of new information (did not remember being told she was in [place] in the hospital) Awareness: Impaired Awareness Impairment: Intellectual impairment;Emergent impairment Problem Solving:  (simple verbal was accurate suspect had difficulty in functional situations. Had difficulty per PT note) Safety/Judgment: Impaired       Comprehension  Auditory Comprehension Overall Auditory Comprehension: Appears within functional limits for tasks assessed Commands: Within Functional Limits Conversation: Simple Visual Recognition/Discrimination Discrimination: Not tested Reading Comprehension Reading Status: Not tested    Expression Expression Primary Mode of Expression: Verbal Verbal Expression Overall Verbal  Expression: Impaired Initiation: Impaired Level of Generative/Spontaneous Verbalization: Sentence Repetition:  (NT) Naming: Impairment Divergent:  (named 3 animals  in one minute) Verbal Errors: Semantic paraphasias;Phonemic paraphasias;Not aware of errors Pragmatics: No impairment Written Expression Written Expression: Not tested   Oral / Motor  Oral Motor/Sensory Function Overall Oral Motor/Sensory Function: Within functional limits Motor Speech Overall Motor Speech: Appears within functional limits for tasks assessed Respiration: Within functional limits Phonation: Normal Resonance: Within functional limits Articulation: Within functional limitis Intelligibility: Intelligible Motor Planning: Witnin functional limits Motor Speech Errors: Not applicable            Royce Macadamia 09/29/2023, 9:53 AM

## 2023-09-29 NOTE — Plan of Care (Signed)
 Patient is pleasantly confused. Dispo is SNF.   Problem: Health Behavior/Discharge Planning: Goal: Ability to manage health-related needs will improve Outcome: Not Met (add Reason)   Problem: Nutrition: Goal: Adequate nutrition will be maintained Outcome: Not Met (add Reason)   Problem: Activity: Goal: Risk for activity intolerance will decrease Outcome: Not Met (add Reason)   Problem: Coping: Goal: Level of anxiety will decrease Outcome: Not Met (add Reason)   Problem: Pain Managment: Goal: General experience of comfort will improve and/or be controlled Outcome: Not Met (add Reason)   Problem: Safety: Goal: Ability to remain free from injury will improve Outcome: Not Met (add Reason)   Problem: Skin Integrity: Goal: Risk for impaired skin integrity will decrease Outcome: Not Met (add Reason)

## 2023-09-29 NOTE — Progress Notes (Signed)
 PROGRESS NOTE    Emily Blair  ZOX:[id number] DOB: 27-Dec-1945 DOA: 09/25/2023 PCP: Maris Berger, MD   Brief Narrative:  The patient is a 78 year old Caucasian female who was sent from her assisted living facility due to acute left-sided weakness, aphasia and left hemineglect.  Facility noted that she had not been behaving abnormally at that time and had some confusion and was found on the floor.  She had some focal symptoms of left-sided weakness so code stroke was called but CT perfusion and MRI were both negative for acute stroke.    She initially had no movement on the left side but her examination improved after his CT scan.  Due to her worsening mental status and concern for airway protection she is electively intubated.  She underwent extensive neurological workup and LP was performed and she had some labile blood pressures overnight.  She was placed on continuous EEG and had no clinical seizures.  She was placed on low-dose Precedex and and initially failed her SBT due to apnea. Spiked a Temperature then extubated on 09/27/2023 and transferred to the [facility] service on 09/29/2023.  She continues to be persistently confused but despite this Neuro has signed off the case but requested they re-evaluate.   Currently electrolytes are being replaced. PT/OT recommending SNF and will need Bed and Insurance Auth.   Assessment and Plan:  Acute Encephalopathy w/ left sided weakness/ left hemi-neglect, with concern for seizure/ prolonged post-ictal state: No prior hx of seizure and  workup is negative with imaging/ MRI unrevealing thus far. [employer] meningitis/ encephalitis panel neg, ok w/ neurology to d/c CNS abx coverage. Working diagnosis at this point remains prolonged postictal state and ? 2/2 to Intracranial Dermoid Cyst.  She is status post LTM EEG monitoring and this has now been discontinued.  Neurology was recommending seizure precautions and continue home Carbamazepine 400 mg po qHS and managing and  optimizing electrolyte management with serial neurochecks. 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  -Per report at baseline she is fairly independent and lives in an ALF and able to go to the bathroom on her own but does have dementia. (Unclear how bad of dementia she has)  She also has a history of falls and left upper humeral fracture; Given her persistent confusion have asked Neuro to evaluate her again and they will see in the AM -PT/OT to evaluate and Treat and recommending SNF and TOC to assist with Placement to obtain Insurance Auth and Bed -C/w Delirium Precautions   Acute Respiratory Failure 2/2 Aspiration PNA: Culture data negative to date. Extubated 3/9, now on room air.  Continue to monitor respiratory status carefully and placed on aspiration precautions along with continuous pulse oximetry.  She is on day 5 of antibiotics and Day 4 for IV Unasyn so we will complete 5 days of Unasyn for aspiration. SpO2: 99 %; O2 Flow Rate (L/min): 3 L/min, FiO2 (%): 35 %. Repeat CXR in the AM and start Xopenex/Atrovent PRN; C/w Aspiration Precautions  ? Paroxysmal Atrial Fibrillation: Per PCCM New onset and CHA2DS2-VASc score of 4 and anticoagulation is recommended.  Optimize electrolytes to ensure magnesium greater than 2 and K+ > 4 and Mag >2.  Unclear etiology of this. EKG that was obtained reviewed and shows NSR w/ PACs. ECHO done and showed EF of 70-75% with no regional wall motion  abnormalities and a left ventricular diastolic parameters being normal. C/w Metoprolol Tartrate 25 mg po BID. AC deferred by PCCM due to unclear etiology of her Mental Status change and weakness.  Will continue to defer anticoagulation given her history of falls and may need to discuss with Cardiology about further recommendations but appears she  is back in Sinus  Emesis: ? Medication related.  Continue with PPI and IV ondansetron 4 mg every 6 as needed and diet has been advanced.  History of HTN Held Lisinopril 20 mg for now, caution with liable BP. Metoprolol Tartrate 25 mg po BID resumed. CTM BP per Protocol. Last BP reading was 136/79  HLD: Simvastatin 10 mg po Daily also held   Bipolar and chronic pain & Arthritis: Appeears to be on oxy at home and being held, ? For prior LUE humeral fx 2/2 fall, unclear timing. Discontinued Fentanyl.   HypoK+: K+ was 3.0. Replete with p.o. KCl 40 mEq twice daily x 2 doses. CTM and Replete as Necessary. Repeat CMP in the AM  HypoPhos: Phos 2.4. Replete with po K Phos Neutral 500 mg x1. CTM and Replete as Necessary  Hx of Bipolar 1: C/w Carbamazepine for Mood Stabilization   GERD/GI Prophylaxis: C/w PPI Pantoprazole 40 mg BID  Normocytic Anemia: Hgb/Hct is 11.2/33.0. Check Anemia Panel in the AM. CTM for S/Sx of Bleeding; No overt bleeding noted. Repeat CBC in AM  Hypoalbuminemia: Patient's Albumin is now 3.0. CTM and Trend and repeat CMP in the AM   DVT prophylaxis: enoxaparin (LOVENOX) injection 40 mg Start: 09/25/23 2200    Code Status: Full Code Family Communication: No family present at bedside  Disposition Plan:  Level of care: Progressive Status is: Inpatient Remains inpatient appropriate because: Needs further clinical improvement in her mental status and significant better authorization availability   Consultants:  PCCM Neurology  Procedures:  As delineated as above  Antimicrobials:  Anti-infectives (From admission, onward)    Start     Dose/Rate Route Frequency Ordered Stop   09/26/23 1815  Ampicillin-Sulbactam (UNASYN) 3 g in sodium chloride 0.9 % 100 mL IVPB        3 g 200 mL/hr over 30 Minutes Intravenous Every 6 hours 09/26/23 1721 09/29/23 1811   09/25/23 2200  vancomycin (VANCOREADY) IVPB 750 mg/150 mL  Status:  Discontinued        750 mg 150 mL/hr over 60  Minutes Intravenous Every 12 hours 09/25/23 0939 09/26/23 1310   09/25/23 2000  cefTRIAXone (ROCEPHIN) 2 g in sodium chloride 0.9 % 100 mL IVPB  Status:  Discontinued        2 g 200 mL/hr over 30 Minutes Intravenous Every 12 hours 09/25/23 0939 09/26/23 1310   09/25/23 1500  ampicillin (OMNIPEN) 2 g in sodium chloride 0.9 % 100 mL IVPB  Status:  Discontinued        2 g 300 mL/hr over 20 Minutes Intravenous Every 6 hours 09/25/23 0939 09/25/23 1014   09/25/23 1415  oseltamivir (TAMIFLU) 6 MG/ML suspension 75 mg        75 mg Per Tube 2 times daily 09/25/23 1318 09/25/23 2204   09/25/23 1300  ampicillin (OMNIPEN) 2 g in sodium chloride 0.9 % 100 mL IVPB  Status:  Discontinued        2 g 300 mL/hr over 20 Minutes Intravenous Every 4 hours 09/25/23 1014 09/26/23 1310   09/25/23 1000  vancomycin (VANCOREADY) IVPB 500 mg/100 mL        500  mg 100 mL/hr over 60 Minutes Intravenous  Once 09/25/23 0947 09/25/23 1143   09/25/23 0745  cefTRIAXone (ROCEPHIN) 2 g in sodium chloride 0.9 % 100 mL IVPB        2 g 200 mL/hr over 30 Minutes Intravenous Once 09/25/23 0742 09/25/23 0837   09/25/23 0745  vancomycin (VANCOCIN) IVPB 1000 mg/200 mL premix        1,000 mg 200 mL/hr over 60 Minutes Intravenous  Once 09/25/23 0742 09/25/23 0946   09/25/23 0745  ampicillin (OMNIPEN) 2 g in sodium chloride 0.9 % 100 mL IVPB        2 g 300 mL/hr over 20 Minutes Intravenous  Once 09/25/23 0742 09/25/23 0946       Subjective: Seen and examined at bedside and remains persistently confused.  Only oriented to herself.  Unable to properly answer my questions and answers are inappropriate.  No other concerns or complaints this time.  Objective: Vitals:   09/29/23 0800 09/29/23 1206 09/29/23 1714 09/29/23 1937  BP: (!) 152/95 134/87 (!) 155/96 136/79  Pulse: 77 72 84 93  Resp: 18   20  Temp: 97.8 F (36.6 C) 98.7 F (37.1 C) 98.3 F (36.8 C) 98.2 F (36.8 C)  TempSrc: Axillary Oral Oral Axillary  SpO2: 96% 99% 98%  99%  Weight:      Height:        Intake/Output Summary (Last 24 hours) at 09/29/2023 2148 Last data filed at 09/29/2023 1800 Gross per 24 hour  Intake 480 ml  Output 400 ml  Net 80 ml   Filed Weights   09/27/23 0423 09/28/23 0412 09/29/23 0500  Weight: 71.4 kg 73 kg 66 kg   Examination: Physical Exam:  Constitutional: Chronically ill-appearing Caucasian female who appears extremely confused and only oriented to herself and is answering questions but very inappropriately Respiratory: Diminished to auscultation bilaterally with some coarse breath sounds, no wheezing, rales, rhonchi or crackles. Normal respiratory effort and patient is not tachypenic. No accessory muscle use.  Cardiovascular: RRR, no murmurs / rubs / gallops. S1 and S2 auscultated. No extremity edema.  Abdomen: Soft, non-tender, ND. Bowel sounds positive.  GU: Deferred. Musculoskeletal: No clubbing / cyanosis of digits/nails. No joint deformity upper and lower extremities. Neurologic: Has some weakness on the left and cranial nerves II through XII grossly intact Psychiatric: Impaired judgment and insight is significantly confused and only oriented to self  Data Reviewed: I have personally reviewed following labs and imaging studies  CBC: Recent Labs  Lab 09/25/23 0332 09/25/23 0340 09/25/23 0814 09/27/23 0345 09/28/23 0300 09/29/23 0524  WBC 10.6*  --   --  8.0 5.7 6.6  NEUTROABS 8.4*  --   --   --   --   --   HGB 14.0 14.6 12.2 11.8* 10.0* 11.2*  HCT 40.2 43.0 36.0 34.9* 30.6* 33.0*  MCV 95.0  --   --  98.6 100.0 96.8  PLT 367  --   --  247 238 289   Basic Metabolic Panel: Recent Labs  Lab 09/25/23 1056 09/25/23 1727 09/26/23 1301 09/26/23 1650 09/27/23 0345 09/28/23 0300 09/29/23 0524 09/29/23 1011  NA 132*  --   --  135 135 136 131*  --   K 3.7  --   --  3.6 3.0* 3.6 3.0*  --   CL 95*  --   --  98 102 104 93*  --   CO2 22  --   --  26 21* 23 27  --  GLUCOSE 157*  --   --  126* 153* 122*  133*  --   BUN 6*  --   --  20 19 12  7*  --   CREATININE 0.68  --   --  0.69 0.71 0.49 0.58  --   CALCIUM 10.3  --   --  10.2 9.9 9.7 10.2  --   MG 1.9   < > 1.9 1.9 1.8 2.0  --  1.8  PHOS 2.2*   < > 2.0* 2.1* 2.4* 2.8  --  2.4*   < > = values in this interval not displayed.   GFR: Estimated Creatinine Clearance: 59.4 mL/min (by C-G formula based on SCr of 0.58 mg/dL). Liver Function Tests: Recent Labs  Lab 09/25/23 0332 09/25/23 1056 09/27/23 0345 09/29/23 1011  AST 22 29 18 21   ALT 12 13 8 13   ALKPHOS 198* 191* 127* 122  BILITOT 1.0 0.9 0.8 0.9  PROT 7.5 7.6 5.8* 6.6  ALBUMIN 4.0 4.0 2.9* 3.0*   No results for input(s): "LIPASE", "AMYLASE" in the last 168 hours. No results for input(s): "AMMONIA" in the last 168 hours. Coagulation Profile: Recent Labs  Lab 09/25/23 0332  INR 1.0   Cardiac Enzymes: No results for input(s): "CKTOTAL", "CKMB", "CKMBINDEX", "TROPONINI" in the last 168 hours. BNP (last 3 results) No results for input(s): "PROBNP" in the last 8760 hours. HbA1C: No results for input(s): "HGBA1C" in the last 72 hours. CBG: Recent Labs  Lab 09/28/23 1131 09/28/23 1616 09/29/23 0850 09/29/23 1208 09/29/23 1715  GLUCAP 125* 122* 139* 129* 155*   Lipid Profile: No results for input(s): "CHOL", "HDL", "LDLCALC", "TRIG", "CHOLHDL", "LDLDIRECT" in the last 72 hours. Thyroid Function Tests: No results for input(s): "TSH", "T4TOTAL", "FREET4", "T3FREE", "THYROIDAB" in the last 72 hours. Anemia Panel: No results for input(s): "VITAMINB12", "FOLATE", "FERRITIN", "TIBC", "IRON", "RETICCTPCT" in the last 72 hours. Sepsis Labs: Recent Labs  Lab 09/25/23 0727 09/26/23 1801  LATICACIDVEN 2.2* 2.7*    Recent Results (from the past 240 hours)  Culture, blood (routine x 2)     Status: None (Preliminary result)   Collection Time: 09/25/23  7:13 AM   Specimen: BLOOD  Result Value Ref Range Status   Specimen Description BLOOD SITE NOT SPECIFIED  Final    Special Requests   Final    BOTTLES DRAWN AEROBIC AND ANAEROBIC Blood Culture results may not be optimal due to an inadequate volume of blood received in culture bottles   Culture   Final    NO GROWTH 4 DAYS Performed at [facility], [address] [place], [place] [address]    Report Status PENDING  Incomplete  Culture, blood (routine x 2)     Status: None (Preliminary result)   Collection Time: 09/25/23  7:18 AM   Specimen: BLOOD  Result Value Ref Range Status   Specimen Description BLOOD SITE NOT SPECIFIED  Final   Special Requests   Final    BOTTLES DRAWN AEROBIC AND ANAEROBIC Blood Culture results may not be optimal due to an inadequate volume of blood received in culture bottles   Culture   Final    NO GROWTH 4 DAYS Performed at [facility], [address] [place], [place] [address]    Report Status PENDING  Incomplete  CSF culture w Gram Stain     Status: None   Collection Time: 09/25/23  9:41 AM   Specimen: CSF; Cerebrospinal Fluid  Result Value Ref Range Status  Specimen Description CSF  Final   Special Requests NONE  Final   Gram Stain NO WBC SEEN NO ORGANISMS SEEN CYTOSPIN SMEAR   Final   Culture   Final    NO GROWTH 3 DAYS Performed at [facility], [address], [place], [place] [address]    Report Status 09/28/2023 FINAL  Final  MRSA Next Gen by PCR, Nasal     Status: None   Collection Time: 09/25/23 10:43 AM   Specimen: Nasal Mucosa; Nasal Swab  Result Value Ref Range Status   MRSA by PCR Next Gen NOT DETECTED NOT DETECTED Final    Comment: (NOTE) The GeneXpert MRSA Assay (FDA approved for NASAL specimens only), is one component of a comprehensive MRSA colonization surveillance program. It is not intended to diagnose MRSA infection nor to guide or monitor treatment for MRSA infections. Test performance is not FDA approved in patients less than 96 years old. Performed at [facility], [address], [place],  [place] [address]   Respiratory (~20 pathogens) panel by PCR     Status: None   Collection Time: 09/27/23  9:14 AM   Specimen: Nasopharyngeal Swab; Respiratory  Result Value Ref Range Status   Adenovirus NOT DETECTED NOT DETECTED Final   Coronavirus 229E NOT DETECTED NOT DETECTED Final    Comment: (NOTE) The Coronavirus on the Respiratory Panel, DOES NOT test for the novel  Coronavirus (2019 nCoV)    Coronavirus HKU1 NOT DETECTED NOT DETECTED Final   Coronavirus NL63 NOT DETECTED NOT DETECTED Final   Coronavirus OC43 NOT DETECTED NOT DETECTED Final   Metapneumovirus NOT DETECTED NOT DETECTED Final   Rhinovirus / Enterovirus NOT DETECTED NOT DETECTED Final   Influenza A NOT DETECTED NOT DETECTED Final   Influenza B NOT DETECTED NOT DETECTED Final   Parainfluenza Virus 1 NOT DETECTED NOT DETECTED Final   Parainfluenza Virus 2 NOT DETECTED NOT DETECTED Final   Parainfluenza Virus 3 NOT DETECTED NOT DETECTED Final   Parainfluenza Virus 4 NOT DETECTED NOT DETECTED Final   Respiratory Syncytial Virus NOT DETECTED NOT DETECTED Final   Bordetella pertussis NOT DETECTED NOT DETECTED Final   Bordetella Parapertussis NOT DETECTED NOT DETECTED Final   Chlamydophila pneumoniae NOT DETECTED NOT DETECTED Final   Mycoplasma pneumoniae NOT DETECTED NOT DETECTED Final    Comment: Performed at [facility], [address], [place], [place] [address]    Radiology Studies: ECHOCARDIOGRAM COMPLETE Result Date: 09/28/2023    ECHOCARDIOGRAM REPORT   Patient Name:   DAVIONNE MASTRANGELO Quin Date of Exam: 09/28/2023 Medical Rec #:  [id number]  Height:       68.0 in Accession #:    [id number] Weight:       160.9 lb Date of Birth:  March 24, 1946  BSA:          1.863 m Patient Age:    77 years   BP:           158/86 mmHg Patient Gender: F          HR:           86 bpm. Exam Location:  Inpatient Procedure: 2D Echo, Cardiac Doppler and Color Doppler (Both Spectral and Color            Flow Doppler were utilized during procedure).  Indications:    Murmur R01.1  History:        Patient has prior history of Echocardiogram examinations, most  recent 04/04/2023.  Sonographer:    Lucendia Herrlich RCS Referring Phys: [phone] PAULA B SIMPSON IMPRESSIONS  1. Left ventricular ejection fraction, by estimation, is 70 to 75%. The left ventricle has hyperdynamic function. The left ventricle has no regional wall motion abnormalities. Left ventricular diastolic parameters were normal.  2. Right ventricular systolic function is normal. The right ventricular size is normal.  3. The mitral valve is normal in structure. No evidence of mitral valve regurgitation. No evidence of mitral stenosis.  4. The aortic valve has an indeterminant number of cusps. Aortic valve regurgitation is not visualized. No aortic stenosis is present.  5. Aortic dilatation noted. There is moderate dilatation of the ascending aorta, measuring 43 mm.  6. The inferior vena cava is normal in size with greater than 50% respiratory variability, suggesting right atrial pressure of 3 mmHg. FINDINGS  Left Ventricle: Left ventricular ejection fraction, by estimation, is 70 to 75%. The left ventricle has hyperdynamic function. The left ventricle has no regional wall motion abnormalities. The left ventricular internal cavity size was normal in size. There is no left ventricular hypertrophy. Left ventricular diastolic parameters were normal. Right Ventricle: The right ventricular size is normal. No increase in right ventricular wall thickness. Right ventricular systolic function is normal. Left Atrium: Left atrial size was normal in size. Right Atrium: Right atrial size was normal in size. Pericardium: There is no evidence of pericardial effusion. Mitral Valve: The mitral valve is normal in structure. No evidence of mitral valve regurgitation. No evidence of mitral valve stenosis. Tricuspid Valve: The tricuspid valve is normal in structure. Tricuspid valve regurgitation is trivial. No  evidence of tricuspid stenosis. Aortic Valve: The aortic valve has an indeterminant number of cusps. Aortic valve regurgitation is not visualized. No aortic stenosis is present. Aortic valve peak gradient measures 11.8 mmHg. Pulmonic Valve: The pulmonic valve was normal in structure. Pulmonic valve regurgitation is not visualized. No evidence of pulmonic stenosis. Aorta: Aortic dilatation noted. There is moderate dilatation of the ascending aorta, measuring 43 mm. Venous: The inferior vena cava is normal in size with greater than 50% respiratory variability, suggesting right atrial pressure of 3 mmHg. IAS/Shunts: No atrial level shunt detected by color flow Doppler.  LEFT VENTRICLE PLAX 2D LVIDd:         2.70 cm   Diastology LVIDs:         1.80 cm   LV e' medial:    5.33 cm/s LV PW:         0.80 cm   LV E/e' medial:  14.9 LV IVS:        1.00 cm   LV e' lateral:   7.29 cm/s LVOT diam:     2.00 cm   LV E/e' lateral: 10.9 LV SV:         57 LV SV Index:   30 LVOT Area:     3.14 cm  RIGHT VENTRICLE             IVC RV S prime:     20.70 cm/s  IVC diam: 2.00 cm TAPSE (M-mode): 1.6 cm LEFT ATRIUM             Index        RIGHT ATRIUM           Index LA diam:        3.00 cm 1.61 cm/m   RA Area:     14.30 cm LA Vol (A2C):   25.0 ml 13.42 ml/m  RA Volume:   34.90 ml  18.73 ml/m LA Vol (A4C):   21.0 ml 11.27 ml/m LA Biplane Vol: 27.1 ml 14.54 ml/m  AORTIC VALVE AV Area (Vmax): 1.73 cm AV Vmax:        172.00 cm/s AV Peak Grad:   11.8 mmHg LVOT Vmax:      94.90 cm/s LVOT Vmean:     62.100 cm/s LVOT VTI:       0.180 m  AORTA Ao Root diam: 3.60 cm Ao Asc diam:  4.30 cm MITRAL VALVE MV Area (PHT): 4.18 cm    SHUNTS MV Decel Time: 182 msec    Systemic VTI:  0.18 m MV E velocity: 79.35 cm/s  Systemic Diam: 2.00 cm MV A velocity: 93.40 cm/s MV E/A ratio:  0.85 Aditya Sabharwal Electronically signed by Dorthula Nettles Signature Date/Time: 09/28/2023/11:03:18 AM    Final    Scheduled Meds:  carbamazepine  400 mg Oral QHS    Chlorhexidine Gluconate Cloth  6 each Topical Daily   enoxaparin (LOVENOX) injection  40 mg Subcutaneous QHS   feeding supplement  237 mL Oral BID BM   insulin aspart  0-15 Units Subcutaneous TID WC   metoprolol tartrate  25 mg Oral BID   pantoprazole  40 mg Oral BID   polyethylene glycol  17 g Oral Daily   potassium chloride  40 mEq Oral BID   senna-docusate  1 tablet Oral BID   Continuous Infusions:  magnesium sulfate bolus IVPB 2 g (09/29/23 2141)    LOS: 4 days   Marguerita Merles, DO [facility] Available via Epic secure chat 7am-7pm After these hours, please refer to coverage provider listed on [email or web address] 09/29/2023, 9:48 PM

## 2023-09-29 NOTE — Evaluation (Signed)
 Occupational Therapy Evaluation Patient Details Name: Emily Blair MRN: [id number] DOB: [birth year] Today's Date: 09/29/2023   History of Present Illness   Pt is 78 year old presented to [facility] on  09/25/23 for lt sided weakness, lt neglect and aphasia. CT, MRI, lumbar puncture negative.Suspicion is for unwitnessed seizure with prolonged postictal state. Intubated 3/7 for airway protection and extubated 3/9. PMH - dementia, arthritis, bipolar, htn, humerus fx     Clinical Impressions Unclear about Prior level of function. Called patient's sister but did not get an answer.  Currently patient is Max A with adls primarily due to motor planning, communication and cognitive status.  She followed one step commands inconsistently.  Sat EOB x 5 minutes with close supervision. Will initiate an adl, but does not sustain the activity.  Patient fatigued at end of session.  Patient will benefit from continued inpatient follow up therapy, <3 hours/day and continued OT while on acute.      If plan is discharge home, recommend the following:   A lot of help with walking and/or transfers;A lot of help with bathing/dressing/bathroom;Assistance with cooking/housework;Direct supervision/assist for medications management;Direct supervision/assist for financial management;Assist for transportation;Supervision due to cognitive status;Help with stairs or ramp for entrance     Functional Status Assessment   Patient has had a recent decline in their functional status and demonstrates the ability to make significant improvements in function in a reasonable and predictable amount of time.     Equipment Recommendations   None recommended by OT     Recommendations for Other Services         Precautions/Restrictions   Precautions Precautions: Fall Recall of Precautions/Restrictions: Impaired Precaution/Restrictions Comments: Left hand mit     Mobility Bed Mobility Overal bed mobility: Needs  Assistance Bed Mobility: Supine to Sit, Sit to Supine     Supine to sit: Min assist Sit to supine: Min assist        Transfers Overall transfer level: Needs assistance                 General transfer comment: Stood x 1 but could not get patient to move feet to scoot up the bed      Balance Overall balance assessment: Needs assistance Sitting-balance support: No upper extremity supported, Feet supported Sitting balance-Leahy Scale: Fair                                     ADL either performed or assessed with clinical judgement   ADL Overall ADL's : Needs assistance/impaired Eating/Feeding: Moderate assistance   Grooming: Maximal assistance;Sitting   Upper Body Bathing: Maximal assistance;Sitting   Lower Body Bathing: +2 for safety/equipment;Sit to/from stand   Upper Body Dressing : Moderate assistance   Lower Body Dressing: +2 for safety/equipment;Sit to/from stand   Toilet Transfer: +2 for safety/equipment;BSC/3in1           Functional mobility during ADLs: +2 for safety/equipment General ADL Comments: sat EOB x5 minutes and then indicated she wanted to go back to bed.  Able to verbalize she was tired from activity     Vision   Additional Comments: needs further assessment     Perception         Praxis         Pertinent Vitals/Pain Pain Assessment Pain Assessment: Faces Faces Pain Scale: Hurts a little bit Pain Location: L shoulder / arm Pain Descriptors / Indicators:  Grimacing, Moaning Pain Intervention(s): Limited activity within patient's tolerance     Extremity/Trunk Assessment Upper Extremity Assessment Upper Extremity Assessment: Generalized weakness;LUE deficits/detail LUE Deficits / Details: decreased rom on the L and pain with shoulder flexion   Lower Extremity Assessment Lower Extremity Assessment: Defer to PT evaluation       Communication Communication Communication: Impaired Factors Affecting  Communication: Difficulty expressing self   Cognition Arousal: Alert Behavior During Therapy: Restless Cognition: No family/caregiver present to determine baseline                               Following commands: Impaired Following commands impaired: Follows one step commands inconsistently, Follows multi-step commands with increased time     Cueing  General Comments   Cueing Techniques: Verbal cues;Gestural cues;Tactile cues  Bruising on hand and R knee appeared swollen compared to L   Exercises     Shoulder Instructions      Home Living Family/patient expects to be discharged to:: Skilled nursing facility     Type of Home: Other(Comment)                           Additional Comments: facility      Prior Functioning/Environment Prior Level of Function : Patient poor historian/Family not available             Mobility Comments: Per facility staff pt independent with mobility ADLs Comments: Called sister - no answer, per PT note patient was independent with toileting.  No other information available    OT Problem List: Decreased strength;Decreased activity tolerance;Impaired balance (sitting and/or standing);Decreased cognition;Decreased safety awareness;Pain   OT Treatment/Interventions: Self-care/ADL training;Therapeutic exercise;Therapeutic activities;Patient/family education;Cognitive remediation/compensation      OT Goals(Current goals can be found in the care plan section)   Acute Rehab OT Goals OT Goal Formulation: Patient unable to participate in goal setting Time For Goal Achievement: 10/13/23   OT Frequency:  Min 2X/week    Co-evaluation              AM-PAC OT "6 Clicks" Daily Activity     Outcome Measure Help from another person eating meals?: A Little Help from another person taking care of personal grooming?: A Lot Help from another person toileting, which includes using toliet, bedpan, or urinal?: A Lot Help  from another person bathing (including washing, rinsing, drying)?: A Lot Help from another person to put on and taking off regular upper body clothing?: A Lot Help from another person to put on and taking off regular lower body clothing?: A Lot 6 Click Score: 13   End of Session Nurse Communication: Mobility status  Activity Tolerance: Patient tolerated treatment well Patient left: in bed;with call bell/phone within reach;with bed alarm set  OT Visit Diagnosis: Unsteadiness on feet (R26.81);Muscle weakness (generalized) (M62.81);History of falling (Z91.81);Other symptoms and signs involving cognitive function;Other symptoms and signs involving the nervous system (R29.898);Pain Pain - Right/Left: Right Pain - part of body: Shoulder                Time: 1610-9604 OT Time Calculation (min): 20 min Charges:  OT General Charges $OT Visit: 1 Visit OT Evaluation $OT Eval Moderate Complexity: 1 Mod  Hal Neer OTR/L   Malachi Bonds 09/29/2023, 3:38 PM

## 2023-09-30 ENCOUNTER — Inpatient Hospital Stay (HOSPITAL_COMMUNITY)

## 2023-09-30 DIAGNOSIS — R4182 Altered mental status, unspecified: Secondary | ICD-10-CM | POA: Diagnosis not present

## 2023-09-30 DIAGNOSIS — J9601 Acute respiratory failure with hypoxia: Secondary | ICD-10-CM

## 2023-09-30 DIAGNOSIS — R400 Somnolence: Secondary | ICD-10-CM | POA: Diagnosis not present

## 2023-09-30 DIAGNOSIS — J9600 Acute respiratory failure, unspecified whether with hypoxia or hypercapnia: Secondary | ICD-10-CM | POA: Diagnosis not present

## 2023-09-30 LAB — COMPREHENSIVE METABOLIC PANEL
ALT: 16 U/L (ref 0–44)
AST: 21 U/L (ref 15–41)
Albumin: 3.5 g/dL (ref 3.5–5.0)
Alkaline Phosphatase: 138 U/L — ABNORMAL HIGH (ref 38–126)
Anion gap: 10 (ref 5–15)
BUN: 10 mg/dL (ref 8–23)
CO2: 30 mmol/L (ref 22–32)
Calcium: 10.5 mg/dL — ABNORMAL HIGH (ref 8.9–10.3)
Chloride: 93 mmol/L — ABNORMAL LOW (ref 98–111)
Creatinine, Ser: 0.62 mg/dL (ref 0.44–1.00)
GFR, Estimated: >60 mL/min (ref >60)
Glucose, Bld: 139 mg/dL — ABNORMAL HIGH (ref 70–99)
Potassium: 2.9 mmol/L — ABNORMAL LOW (ref 3.5–5.1)
Sodium: 133 mmol/L — ABNORMAL LOW (ref 135–145)
Total Bilirubin: 1 mg/dL (ref 0.0–1.2)
Total Protein: 7.6 g/dL (ref 6.5–8.1)

## 2023-09-30 LAB — CULTURE, BLOOD (ROUTINE X 2)
Culture: NO GROWTH
Culture: NO GROWTH

## 2023-09-30 LAB — VITAMIN B12: Vitamin B-12: 50 pg/mL — ABNORMAL LOW (ref 180–914)

## 2023-09-30 LAB — RETICULOCYTES
Immature Retic Fract: 7.4 % (ref 2.3–15.9)
RBC.: 3.81 MIL/uL — ABNORMAL LOW (ref 3.87–5.11)
Retic Count, Absolute: 69.3 10*3/uL (ref 19.0–186.0)
Retic Ct Pct: 1.8 % (ref 0.4–3.1)

## 2023-09-30 LAB — CBC WITH DIFFERENTIAL/PLATELET
Abs Immature Granulocytes: 0.04 10*3/uL (ref 0.00–0.07)
Basophils Absolute: 0 10*3/uL (ref 0.0–0.1)
Basophils Relative: 0 %
Eosinophils Absolute: 0 10*3/uL (ref 0.0–0.5)
Eosinophils Relative: 1 %
HCT: 35.9 % — ABNORMAL LOW (ref 36.0–46.0)
Hemoglobin: 12.4 g/dL (ref 12.0–15.0)
Immature Granulocytes: 1 %
Lymphocytes Relative: 17 %
Lymphs Abs: 1.4 10*3/uL (ref 0.7–4.0)
MCH: 33.1 pg (ref 26.0–34.0)
MCHC: 34.5 g/dL (ref 30.0–36.0)
MCV: 95.7 fL (ref 80.0–100.0)
Monocytes Absolute: 0.8 10*3/uL (ref 0.1–1.0)
Monocytes Relative: 10 %
Neutro Abs: 5.9 10*3/uL (ref 1.7–7.7)
Neutrophils Relative %: 71 %
Platelets: 332 10*3/uL (ref 150–400)
RBC: 3.75 MIL/uL — ABNORMAL LOW (ref 3.87–5.11)
RDW: 12.4 % (ref 11.5–15.5)
WBC: 8.1 10*3/uL (ref 4.0–10.5)
nRBC: 0 % (ref 0.0–0.2)

## 2023-09-30 LAB — IRON AND TIBC
Iron: 27 ug/dL — ABNORMAL LOW (ref 28–170)
Saturation Ratios: 9 % — ABNORMAL LOW (ref 10.4–31.8)
TIBC: 288 ug/dL (ref 250–450)
UIBC: 261 ug/dL

## 2023-09-30 LAB — GLUCOSE, CAPILLARY
Glucose-Capillary: 139 mg/dL — ABNORMAL HIGH (ref 70–99)
Glucose-Capillary: 122 mg/dL — ABNORMAL HIGH (ref 70–99)
Glucose-Capillary: 141 mg/dL — ABNORMAL HIGH (ref 70–99)
Glucose-Capillary: 112 mg/dL — ABNORMAL HIGH (ref 70–99)

## 2023-09-30 LAB — FERRITIN: Ferritin: 270 ng/mL (ref 11–307)

## 2023-09-30 LAB — FOLATE: Folate: 7.6 ng/mL (ref >5.9)

## 2023-09-30 LAB — PHOSPHORUS: Phosphorus: 2.9 mg/dL (ref 2.5–4.6)

## 2023-09-30 LAB — RPR: RPR Ser Ql: NONREACTIVE

## 2023-09-30 LAB — AMMONIA: Ammonia: <10 umol/L (ref 9–35)

## 2023-09-30 LAB — MAGNESIUM: Magnesium: 2.3 mg/dL (ref 1.7–2.4)

## 2023-09-30 LAB — TSH: TSH: 1.81 u[IU]/mL (ref 0.350–4.500)

## 2023-09-30 MED ORDER — MELATONIN 5 MG PO TABS
5.0000 mg | ORAL_TABLET | Freq: Every evening | ORAL | Status: DC | PRN
  Administered 2023-09-30 – 2023-10-01 (×2): 5 mg via ORAL
  Filled 2023-09-30 (×2): qty 1

## 2023-09-30 MED ORDER — POTASSIUM CHLORIDE CRYS ER 20 MEQ PO TBCR
40.0000 meq | EXTENDED_RELEASE_TABLET | ORAL | Status: AC
  Administered 2023-09-30 (×2): 40 meq via ORAL
  Filled 2023-09-30 (×2): qty 2

## 2023-09-30 MED ORDER — POTASSIUM PHOSPHATES 15 MMOLE/5ML IV SOLN
30.0000 mmol | Freq: Once | INTRAVENOUS | Status: AC
  Administered 2023-09-30: 30 mmol via INTRAVENOUS
  Filled 2023-09-30: qty 10

## 2023-09-30 MED ORDER — CYANOCOBALAMIN 1000 MCG/ML IJ SOLN: 1000.0000 ug | INTRAMUSCULAR | Status: DC

## 2023-09-30 MED ORDER — CYANOCOBALAMIN 1000 MCG/ML IJ SOLN
1000.0000 ug | Freq: Every day | INTRAMUSCULAR | Status: DC
  Administered 2023-09-30 – 2023-10-03 (×4): 1000 ug via INTRAMUSCULAR
  Filled 2023-09-30 (×4): qty 1

## 2023-09-30 NOTE — Progress Notes (Signed)
 [facility]                                                                              Emily Blair, is a 78 y.o. female, DOB - Oct 10, 1945, ZOX:[id number] Admit date - 09/25/2023    Outpatient Primary MD for the patient is Sistasis, Lowell Guitar, MD  LOS - 5  days  Chief Complaint  Patient presents with   Altered Mental Status   Fall       Brief summary   Patient is a 78 year old female with a history of arthritis, bipolar disorder, HTN, osteoporosis sent from ALF due to acute left-sided weakness, aphasia and left hemineglect.  Facility noted that she had not been behaving normally at that time, had some confusion and then was found on the floor early morning with worsening confusion.  In ED, code stroke was called however CT perfusion and MRI were both negative.   Due to worsening mental status and concern for airway protection, she was electively intubated.  Workup was initiated, underwent continuous EEG and had no clinical seizures.  She was placed on low-dose Precedex in ICU. Transferred to [facility] on 09/29/2023, remains persistently confused.  Neurology was requested to reconsult again.    Assessment & Plan    Acute encephalopathy, left-sided weakness/left hemineglect Concern for seizure/prolonged postictal state -No prior history of seizures, workup negative with imaging including MRI. -At baseline, she is fairly independent, has mild dementia, able to go to the bathroom on her own.  Has history of falls, left upper humerus fracture. -Underwent LTM EEG, antibiotics have been discontinued -Per neurology, recommend outpatient follow-up for dermoid monitoring -Awaiting repeat neurology evaluation today, continue PT OT evaluation     Acute Respiratory Failure due to aspiration pneumonia -Extubated on 3/9, now on room air -Completed IV Unasyn -Continue nebs, aspiration precautions  B12 deficiency -B12 50, placed on vitamin B12 replacement  Hypokalemia -Replaced with  K-Phos x 1, p.o. Kdur x 2   ? Paroxysmal Atrial Fibrillation: Per PCCM New onset  -2D echo showed EF of 70 to 75% with no regional WMA, normal left ventricular diastolic parameters  - currently in NSR.  Continue beta-blocker -Anticoagulation deferred due to history of falls   Essential hypertension -Continue Lopressor  Hypophosphatemia -Replaced with K-Phos IV x 1   HLD:  -Continue statin   Bipolar and chronic pain & Arthritis:  -On oxycodone outpatient      Hx of Bipolar 1:  -Continue Tegretol    GERD/ -Continue Protonix   Normocytic Anemia:  -H&H stable, anemia panel showed iron 27, percent saturation ratio 9, ferritin 270, folate 7.6, B12 50   Estimated body mass index is 22.13 kg/m as calculated from the following:   Height as of this encounter: 5' 7.99" (1.727 m).   Weight as of this encounter: 66 kg.  Code Status: Full code DVT Prophylaxis:  enoxaparin (LOVENOX) injection 40 mg Start: 09/25/23 2200   Level of Care: Level of care: Progressive Family Communication:  Disposition Plan:      Remains inpatient appropriate:      Procedures:    Consultants:   Neurology  Antimicrobials:  Anti-infectives (From admission, onward)    Start     Dose/Rate Route Frequency Ordered Stop   09/26/23 1815  Ampicillin-Sulbactam (UNASYN) 3 g in sodium chloride 0.9 % 100 mL IVPB        3 g 200 mL/hr over 30 Minutes Intravenous Every 6 hours 09/26/23 1721 09/29/23 1811   09/25/23 2200  vancomycin (VANCOREADY) IVPB 750 mg/150 mL  Status:  Discontinued        750 mg 150 mL/hr over 60 Minutes Intravenous Every 12 hours 09/25/23 0939 09/26/23 1310   09/25/23 2000  cefTRIAXone (ROCEPHIN) 2 g in sodium chloride 0.9 % 100 mL IVPB  Status:  Discontinued        2 g 200 mL/hr over 30 Minutes Intravenous Every 12 hours 09/25/23 0939 09/26/23 1310   09/25/23 1500  ampicillin (OMNIPEN) 2 g in sodium chloride 0.9 % 100 mL IVPB  Status:  Discontinued        2 g 300 mL/hr over 20  Minutes Intravenous Every 6 hours 09/25/23 0939 09/25/23 1014   09/25/23 1415  oseltamivir (TAMIFLU) 6 MG/ML suspension 75 mg        75 mg Per Tube 2 times daily 09/25/23 1318 09/25/23 2204   09/25/23 1300  ampicillin (OMNIPEN) 2 g in sodium chloride 0.9 % 100 mL IVPB  Status:  Discontinued        2 g 300 mL/hr over 20 Minutes Intravenous Every 4 hours 09/25/23 1014 09/26/23 1310   09/25/23 1000  vancomycin (VANCOREADY) IVPB 500 mg/100 mL        500 mg 100 mL/hr over 60 Minutes Intravenous  Once 09/25/23 0947 09/25/23 1143   09/25/23 0745  cefTRIAXone (ROCEPHIN) 2 g in sodium chloride 0.9 % 100 mL IVPB        2 g 200 mL/hr over 30 Minutes Intravenous Once 09/25/23 0742 09/25/23 0837   09/25/23 0745  vancomycin (VANCOCIN) IVPB 1000 mg/200 mL premix        1,000 mg 200 mL/hr over 60 Minutes Intravenous  Once 09/25/23 0742 09/25/23 0946   09/25/23 0745  ampicillin (OMNIPEN) 2 g in sodium chloride 0.9 % 100 mL IVPB        2 g 300 mL/hr over 20 Minutes Intravenous  Once 09/25/23 0742 09/25/23 0946          Medications  carbamazepine  400 mg Oral QHS   Chlorhexidine Gluconate Cloth  6 each Topical Daily   enoxaparin (LOVENOX) injection  40 mg Subcutaneous QHS   feeding supplement  237 mL Oral BID BM   insulin aspart  0-15 Units Subcutaneous TID WC   metoprolol tartrate  25 mg Oral BID   pantoprazole  40 mg Oral BID   polyethylene glycol  17 g Oral Daily   senna-docusate  1 tablet Oral BID      Subjective:   Emily Blair was seen and examined today.  No acute complaints, oriented to herself and place.  Patient denies dizziness, chest pain, shortness of breath, abdominal pain, N/V.  Objective:   Vitals:   09/29/23 1937 09/29/23 2333 09/30/23 0347 09/30/23 1157  BP: 136/79 (!) 124/112 (!) 164/92 (!) 134/105  Pulse: 93 74 81 84  Resp: 20 18  18   Temp: 98.2 F (36.8 C) 98.5 F (36.9 C) 99 F (37.2 C) 98.2 F (36.8 C)  TempSrc: Axillary Axillary Axillary Oral  SpO2: 99% 98%  100% 99%  Weight:      Height:  Intake/Output Summary (Last 24 hours) at 09/30/2023 1417 Last data filed at 09/29/2023 1800 Gross per 24 hour  Intake 120 ml  Output 400 ml  Net -280 ml     Wt Readings from Last 3 Encounters:  09/29/23 66 kg     Exam General: Alert and oriented x self and place, still confused Cardiovascular: S1 S2 auscultated,  RRR Respiratory: Diminished sounds at the bases Gastrointestinal: Soft, nontender, nondistended, + bowel sounds Ext: no pedal edema bilaterally Neuro: some weakness on the left Psych: confused    Data Reviewed:  I have personally reviewed following labs    CBC Lab Results  Component Value Date   WBC 8.1 09/30/2023   RBC 3.75 (L) 09/30/2023   RBC 3.81 (L) 09/30/2023   HGB 12.4 09/30/2023   HCT 35.9 (L) 09/30/2023   MCV 95.7 09/30/2023   MCH 33.1 09/30/2023   PLT 332 09/30/2023   MCHC 34.5 09/30/2023   RDW 12.4 09/30/2023   LYMPHSABS 1.4 09/30/2023   MONOABS 0.8 09/30/2023   EOSABS 0.0 09/30/2023   BASOSABS 0.0 09/30/2023     Last metabolic panel Lab Results  Component Value Date   NA 133 (L) 09/30/2023   K 2.9 (L) 09/30/2023   CL 93 (L) 09/30/2023   CO2 30 09/30/2023   BUN 10 09/30/2023   CREATININE 0.62 09/30/2023   GLUCOSE 139 (H) 09/30/2023   GFRNONAA >60 09/30/2023   CALCIUM 10.5 (H) 09/30/2023   PHOS 2.9 09/30/2023   PROT 7.6 09/30/2023   ALBUMIN 3.5 09/30/2023   BILITOT 1.0 09/30/2023   ALKPHOS 138 (H) 09/30/2023   AST 21 09/30/2023   ALT 16 09/30/2023   ANIONGAP 10 09/30/2023    CBG (last 3)  Recent Labs    09/29/23 2147 09/30/23 0617 09/30/23 1158  GLUCAP 153* 139* 122*      Coagulation Profile: Recent Labs  Lab 09/25/23 0332  INR 1.0     Radiology Studies: I have personally reviewed the imaging studies  DG CHEST PORT 1 VIEW Result Date: 09/30/2023 CLINICAL DATA:  Shortness of breath EXAM: PORTABLE CHEST 1 VIEW COMPARISON:  09/25/2023 FINDINGS: Endotracheal tube and  gastric catheter have been removed in the interval. Lungs are well aerated bilaterally. Tortuous thoracic aorta is again seen. IMPRESSION: No active disease. Electronically Signed   By: Alcide Clever M.D.   On: 09/30/2023 10:41       Emily Blair M.D. [facility] 09/30/2023, 2:17 PM  Available via Epic secure chat 7am-7pm After 7 pm, please refer to night coverage provider listed on amion.

## 2023-09-30 NOTE — Care Management Important Message (Signed)
 Important Message  Patient Details  Name: Emily Blair MRN: [id number] Date of Birth: 1946-02-01   Important Message Given:  Yes - Medicare IM     Dorena Bodo 09/30/2023, 3:14 PM

## 2023-09-30 NOTE — TOC Initial Note (Signed)
 Transition of Care [facility]) - Initial/Assessment Note    Patient Details  Name: Emily Blair MRN: [id number] Date of Birth: 07-24-1945  [facility] CM/SW Contact:    Cherre Blanc, RN Phone Number: 09/30/2023, 2:02 PM  Clinical Narrative:                 Patient from an assisted living facility. PT/OT recommending SNF for inpatient therapy. Insurance Auth received: Auth ID [id number]. Awaiting Neuro eval. TOC will continue to follow patient.   Expected Discharge Plan: Skilled Nursing Facility     Patient Goals and CMS Choice            Expected Discharge Plan and Services                                              Prior Living Arrangements/Services                       Activities of Daily Living   ADL Screening (condition at time of admission) Independently performs ADLs?: Yes (appropriate for developmental age) Is the patient deaf or have difficulty hearing?: No Does the patient have difficulty seeing, even when wearing glasses/contacts?: No Does the patient have difficulty concentrating, remembering, or making decisions?: No  Permission Sought/Granted                  Emotional Assessment              Admission diagnosis:  Meningitis [G03.9] Altered mental status, unspecified altered mental status type [R41.82] Patient Active Problem List   Diagnosis Date Noted   Altered mental status 09/27/2023   Acute respiratory failure (HCC) 09/26/2023   Meningitis 09/25/2023   PCP:  Maris Berger, MD Pharmacy:   [facility] [phone] Rosalita Levan, [place] - [address] AT [facility] [address] [place] [address] Phone: [phone] Fax: [phone]     Social Drivers of Health (SDOH) Social History: SDOH Screenings   Food Insecurity: Patient Unable To Answer (09/27/2023)  Housing: Patient Unable To Answer (09/27/2023)  Transportation Needs: Patient Unable To Answer  (09/27/2023)  Utilities: Patient Unable To Answer (09/27/2023)  Social Connections: Patient Unable To Answer (09/27/2023)  Tobacco Use: Unknown (09/25/2023)   SDOH Interventions:     Readmission Risk Interventions     No data to display

## 2023-09-30 NOTE — Plan of Care (Signed)
   Problem: Education: Goal: Knowledge of General Education information will improve Description Including pain rating scale, medication(s)/side effects and non-pharmacologic comfort measures Outcome: Progressing

## 2023-09-30 NOTE — Progress Notes (Signed)
 Physical Therapy Treatment Patient Details Name: Emily Blair MRN: [id number] DOB: 1945/07/29 Today's Date: 09/30/2023   History of Present Illness Pt is 78 year old presented to [facility] on  09/25/23 for lt sided weakness, lt neglect and aphasia. CT, MRI, lumbar puncture negative.Suspicion is for unwitnessed seizure with prolonged postictal state. Intubated 3/7 for airway protection and extubated 3/9. PMH - dementia, arthritis, bipolar, htn, humerus fx    PT Comments  Pt is progressing with functional mobility and following directions. Pt was supervision to CGA for bed mobility and Min A for sit to stand with RW this session. Pt was able to take side steps at EOB with poor wgt shifting requiring multi modal cueing to successfully side step today. Pt continues to be limited by  cognition. Due to pt current functional status, home set up and available assistance at home recommending skilled physical therapy services < 3 hours/day in order to address strength, balance and functional mobility to decrease risk for falls, injury, immobility, skin break down and re-hospitalization.      If plan is discharge home, recommend the following: A lot of help with walking and/or transfers;A lot of help with bathing/dressing/bathroom;Direct supervision/assist for medications management;Direct supervision/assist for financial management;Assistance with feeding;Assist for transportation   Can travel by private vehicle     No  Equipment Recommendations  Wheelchair (measurements PT);Wheelchair cushion (measurements PT)       Precautions / Restrictions Precautions Precautions: Fall Recall of Precautions/Restrictions: Impaired Restrictions Weight Bearing Restrictions Per Provider Order: No     Mobility  Bed Mobility Overal bed mobility: Needs Assistance Bed Mobility: Supine to Sit, Sit to Supine     Supine to sit: Supervision Sit to supine: Contact guard assist   General bed mobility comments: verbal cues to  initiate movement with visual cues for sitting to supine for appropriate positioning of head    Transfers Overall transfer level: Needs assistance Equipment used: Rolling walker (2 wheels) Transfers: Sit to/from Stand Sit to Stand: Min assist           General transfer comment: Min A to stand from EOB. Pt was able to take 3x side steps with assist wgt shifting and multi modal cues. Pt was attemptingr to ambulate forward    Ambulation/Gait             Pre-gait activities: side stepping at EOB> posterior lean Min a to remain standing with verbal cues and assist wgt shifting with very low foot clearance and very small steps.        Balance Overall balance assessment: Needs assistance Sitting-balance support: No upper extremity supported, Feet supported Sitting balance-Leahy Scale: Fair Sitting balance - Comments: Very close SBA to Min A for sitting EOB due to random posterior LOB most likely intentional due to pt stating she is tired. Postural control: Posterior lean   Standing balance-Leahy Scale: Poor Standing balance comment: posterior lean relies on external support to maintain balance.          Communication Communication Communication: Impaired Factors Affecting Communication: Difficulty expressing self  Cognition Arousal: Alert Behavior During Therapy: Restless   PT - Cognitive impairments: Difficult to assess, Attention, Initiation, Problem solving, Sequencing, Awareness, Safety/Judgement Difficult to assess due to: Impaired communication       PT - Cognition Comments: poor sequencing Following commands: Impaired Following commands impaired: Only follows one step commands consistently    Cueing Cueing Techniques: Verbal cues, Gestural cues, Tactile cues     General Comments General comments (skin  integrity, edema, etc.): bruising on bil hands.      Pertinent Vitals/Pain Pain Assessment Faces Pain Scale: Hurts a little bit Breathing:  normal Negative Vocalization: none Facial Expression: smiling or inexpressive Body Language: relaxed Consolability: no need to console PAINAD Score: 0 Pain Location: R hand Pain Descriptors / Indicators: Discomfort Pain Intervention(s): Monitored during session    Home Living Family/patient expects to be discharged to:: Skilled nursing facility     Type of Home: Other(Comment)             Additional Comments: facility        PT Goals (current goals can now be found in the care plan section) Acute Rehab PT Goals Patient Stated Goal: unable to state PT Goal Formulation: Patient unable to participate in goal setting Time For Goal Achievement: 10/12/23 Potential to Achieve Goals: Fair Progress towards PT goals: Progressing toward goals    Frequency    Min 2X/week      PT Plan  Continue with current POC        AM-PAC PT "6 Clicks" Mobility   Outcome Measure  Help needed turning from your back to your side while in a flat bed without using bedrails?: A Little Help needed moving from lying on your back to sitting on the side of a flat bed without using bedrails?: A Little Help needed moving to and from a bed to a chair (including a wheelchair)?: A Little Help needed standing up from a chair using your arms (e.g., wheelchair or bedside chair)?: A Little Help needed to walk in hospital room?: Total Help needed climbing 3-5 steps with a railing? : Total 6 Click Score: 14    End of Session Equipment Utilized During Treatment: Gait belt Activity Tolerance: Patient tolerated treatment well Patient left: in bed;with call bell/phone within reach;with bed alarm set Nurse Communication: Mobility status PT Visit Diagnosis: Unsteadiness on feet (R26.81);Other abnormalities of gait and mobility (R26.89);Muscle weakness (generalized) (M62.81);Difficulty in walking, not elsewhere classified (R26.2);Other symptoms and signs involving the nervous system (R29.898)     Time:  1610-9604 PT Time Calculation (min) (ACUTE ONLY): 16 min  Charges:    $Therapeutic Activity: 8-22 mins PT General Charges $$ ACUTE PT VISIT: 1 Visit                     Harrel Carina, DPT, CLT  Acute Rehabilitation Services Office: [phone] (Secure chat preferred)    Emily Blair 09/30/2023, 2:53 PM

## 2023-09-30 NOTE — Progress Notes (Signed)
 NEUROLOGY CONSULT FOLLOW UP NOTE   Date of service: September 30, 2023 Patient Name: Emily Blair MRN:  [id number] DOB:  [birth year]  Interval Hx/subjective   Reconsulted for continued confusion.  No family at the bedside. Patient is alert and awake and is pleasantly confused.   Labs  B12 50  Ammonia negative TSH 1.810 Folate 7.6 Na 133  K 2.9   Vitals   Vitals:   09/29/23 1937 09/29/23 2333 09/30/23 0347 09/30/23 1157  BP: 136/79 (!) 124/112 (!) 164/92 (!) 134/105  Pulse: 93 74 81 84  Resp: 20 18  18   Temp: 98.2 F (36.8 C) 98.5 F (36.9 C) 99 F (37.2 C) 98.2 F (36.8 C)  TempSrc: Axillary Axillary Axillary Oral  SpO2: 99% 98% 100% 99%  Weight:      Height:         Body mass index is 22.13 kg/m.  Physical Exam   Constitutional: Appears well-developed and well-nourished.   Psych: Affect appropriate to situation.   Eyes: No scleral injection.   HENT: No OP obstrucion.   Head: Normocephalic.   Cardiovascular: Normal rate and regular rhythm.   Respiratory: Effort normal, non-labored breathing.   GI: Soft.  No distension. There is no tenderness.   Skin: WDI.    Neurologic Examination   Mental Status -  Patient is awake and alert. She is oriented to self, and date of birthdate. For age she states"78", and for month she states "May". She is unaware of situation or place. She is able to name some objects and repeat. She can follow simple commands  Cranial Nerves II - XII - II - Visual field intact OU. III, IV, VI - Extraocular movements intact. V - Facial sensation intact bilaterally. VII - Facial movement intact bilaterally. VIII - Hearing & vestibular intact bilaterally. X - Palate elevates symmetrically. XI - Chin turning & shoulder shrug intact bilaterally. XII - Tongue protrusion intact.  Motor Strength - left side appears to be weaker than right which is her baseline, right side 5/5.  Bulk was normal and fasciculations were absent .   Motor Tone - Muscle  tone was assessed at the neck and appendages and was normal . Sensory - Light touch, temperature/pinprick were assessed and were symmetrical.   Coordination - The patient had normal movements in the hands and feet with no ataxia or dysmetria.  Tremor was absent. Gait and Station - deferred.  Medications  Current Facility-Administered Medications:    acetaminophen (TYLENOL) tablet 650 mg, 650 mg, Per Tube, Q6H PRN, Lynnell Catalan, MD, 650 mg at 09/27/23 4098   carbamazepine (TEGRETOL XR) 12 hr tablet 400 mg, 400 mg, Oral, QHS, Calton Dach I, RPH, 400 mg at 09/29/23 2126   Chlorhexidine Gluconate Cloth 2 % PADS 6 each, 6 each, Topical, Daily, Agarwala, Daleen Bo, MD, 6 each at 09/27/23 2230   enoxaparin (LOVENOX) injection 40 mg, 40 mg, Subcutaneous, QHS, Agarwala, Ravi, MD, 40 mg at 09/29/23 2136   feeding supplement (ENSURE ENLIVE / ENSURE PLUS) liquid 237 mL, 237 mL, Oral, BID BM, Chand, Sudham, MD, 237 mL at 09/30/23 0916   insulin aspart (novoLOG) injection 0-15 Units, 0-15 Units, Subcutaneous, TID WC, Calton Dach I, RPH, 2 Units at 09/30/23 0753   ipratropium (ATROVENT) nebulizer solution 0.5 mg, 0.5 mg, Nebulization, Q6H PRN, Sheikh, Omair Latif, DO   levalbuterol [facility]) nebulizer solution 0.63 mg, 0.63 mg, Nebulization, Q6H PRN, Sheikh, Omair Latif, DO   LORazepam (ATIVAN) injection 2 mg, 2 mg, Intravenous,  Once PRN, Norton Blizzard, NP   metoprolol tartrate (LOPRESSOR) tablet 25 mg, 25 mg, Oral, BID, Simonne Martinet, NP, 25 mg at 09/30/23 0911   ondansetron (ZOFRAN) injection 4 mg, 4 mg, Intravenous, Q6H PRN, Simonne Martinet, NP, 4 mg at 09/28/23 1053   pantoprazole (PROTONIX) EC tablet 40 mg, 40 mg, Oral, BID, Ilda Basset, RPH, 40 mg at 09/30/23 0911   polyethylene glycol (MIRALAX / GLYCOLAX) packet 17 g, 17 g, Oral, Daily, Calton Dach I, RPH, 17 g at 09/30/23 0913   senna-docusate (Senokot-S) tablet 1 tablet, 1 tablet, Oral, BID, De Burrs, [facility], 1  tablet at 09/30/23 0911  Labs and Diagnostic Imaging   CBC:  Recent Labs  Lab 09/25/23 0332 09/25/23 0340 09/29/23 0524 09/30/23 0550  WBC 10.6*   < > 6.6 8.1  NEUTROABS 8.4*  --   --  5.9  HGB 14.0   < > 11.2* 12.4  HCT 40.2   < > 33.0* 35.9*  MCV 95.0   < > 96.8 95.7  PLT 367   < > 289 332   < > = values in this interval not displayed.    Basic Metabolic Panel:  Lab Results  Component Value Date   NA 133 (L) 09/30/2023   K 2.9 (L) 09/30/2023   CO2 30 09/30/2023   GLUCOSE 139 (H) 09/30/2023   BUN 10 09/30/2023   CREATININE 0.62 09/30/2023   CALCIUM 10.5 (H) 09/30/2023   GFRNONAA >60 09/30/2023   Lipid Panel: No results found for: "LDLCALC" HgbA1c: No results found for: "HGBA1C" Urine Drug Screen: No results found for: "LABOPIA", "COCAINSCRNUR", "LABBENZ", "AMPHETMU", "THCU", "LABBARB"  Alcohol Level     Component Value Date/Time   ETH <10 09/25/2023 0332   INR  Lab Results  Component Value Date   INR 1.0 09/25/2023   APTT  Lab Results  Component Value Date   APTT 24 09/25/2023   AED levels: No results found for: "PHENYTOIN", "ZONISAMIDE", "LAMOTRIGINE", "LEVETIRACETA"  MRI Brain(Personally reviewed): Negative   Labs: CSF RBC 16 CSF WBC one CSF protein 43 CSF glucose 94 CSF culture:  Negative [employer] meningitis/encephalitis panel-negative VDRL: non reactive   Assessment   Emily Blair is a 78 y.o. female with presentation of left-sided weakness and left hemineglect with negative imaging and workup.  My suspicion is she likely did have a seizure with prolonged postictal state.  There has been no other explanation for her acute focal neurological symptoms found otherwise. CT/MRI showed no acute abnormality. 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 Amada Jupiter spoke with facility 3/9.  At baseline, she is fairly independent, able to go to the bathroom on her own, etc., but does have dementia.  Appears she is on oxycodone BID at home, recommend  weaning this down as able. She has prior or fall and LUE humeral fracture. Denied pain on assessment today, but patient is confused at baseline, unsure of reliability of her as a historian. She is on Tegretol solely for mood stabilization, no history of episodes concerning for seizure. Tegretol level 3/8 was sub-therapeutic.    Impression: Seizure with prolonged post-ictal state in patient with no known history of epilepsy secondary to low tegretol level/therefore questionable compliance, infectious process (aspiration pneumonia) lowering seizure threshold, ? secondary to intracranial Dermoid cyst.   Recommendations  - continue seizure precautions - continue home tegretol  - Recommend B12 replacement for a goal > 400 - Continue to correct metabolic derangements - Avoid deliriogenic  medications, including weaning down oxycodone at home.  - Recommend outpatient follow-up for Dermoid monitoring, repeat imaging per outpatient provider ______________________________________________________________________   Gevena Mart DNP, ACNPC-AG  [facility]   Attending Neurohospitalist Addendum Patient seen and examined with APP/Resident. Agree with the history and physical as documented above. Agree with the plan as documented, which I helped formulate. I have edited the note above to reflect my full findings and recommendations. I have independently reviewed the chart, obtained history, review of systems and examined the patient.I have personally reviewed pertinent head/neck/spine imaging (CT/MRI). Please feel free to call with any questions.  She is severely B12 deficient at 50. I have ordered IM B12 supplementation to be given daily while hospitalized for up to 7 days. After that she should receive IM weekly x4 wks after which she should have a B12 level checked to guide further treatment. Will continue to follow.  -- Bing Neighbors, MD [facility] [phone]  If 7pm- 7am, please page neurology on call as listed in AMION.

## 2023-10-01 ENCOUNTER — Inpatient Hospital Stay (HOSPITAL_COMMUNITY)

## 2023-10-01 ENCOUNTER — Encounter (HOSPITAL_COMMUNITY): Payer: Self-pay | Admitting: Pulmonary Disease

## 2023-10-01 DIAGNOSIS — J9600 Acute respiratory failure, unspecified whether with hypoxia or hypercapnia: Secondary | ICD-10-CM | POA: Diagnosis not present

## 2023-10-01 DIAGNOSIS — I480 Paroxysmal atrial fibrillation: Secondary | ICD-10-CM | POA: Diagnosis present

## 2023-10-01 DIAGNOSIS — E538 Deficiency of other specified B group vitamins: Secondary | ICD-10-CM | POA: Diagnosis not present

## 2023-10-01 DIAGNOSIS — R569 Unspecified convulsions: Secondary | ICD-10-CM | POA: Diagnosis not present

## 2023-10-01 DIAGNOSIS — D649 Anemia, unspecified: Secondary | ICD-10-CM | POA: Diagnosis present

## 2023-10-01 DIAGNOSIS — I10 Essential (primary) hypertension: Secondary | ICD-10-CM | POA: Diagnosis present

## 2023-10-01 DIAGNOSIS — E785 Hyperlipidemia, unspecified: Secondary | ICD-10-CM | POA: Diagnosis present

## 2023-10-01 DIAGNOSIS — F0390 Unspecified dementia without behavioral disturbance: Secondary | ICD-10-CM | POA: Diagnosis not present

## 2023-10-01 DIAGNOSIS — R531 Weakness: Secondary | ICD-10-CM | POA: Diagnosis not present

## 2023-10-01 LAB — BASIC METABOLIC PANEL
Anion gap: 11 (ref 5–15)
BUN: 7 mg/dL — ABNORMAL LOW (ref 8–23)
CO2: 26 mmol/L (ref 22–32)
Calcium: 10 mg/dL (ref 8.9–10.3)
Chloride: 94 mmol/L — ABNORMAL LOW (ref 98–111)
Creatinine, Ser: 0.47 mg/dL (ref 0.44–1.00)
GFR, Estimated: >60 mL/min (ref >60)
Glucose, Bld: 154 mg/dL — ABNORMAL HIGH (ref 70–99)
Potassium: 4.2 mmol/L (ref 3.5–5.1)
Sodium: 131 mmol/L — ABNORMAL LOW (ref 135–145)

## 2023-10-01 LAB — GLUCOSE, CAPILLARY
Glucose-Capillary: 162 mg/dL — ABNORMAL HIGH (ref 70–99)
Glucose-Capillary: 139 mg/dL — ABNORMAL HIGH (ref 70–99)
Glucose-Capillary: 176 mg/dL — ABNORMAL HIGH (ref 70–99)
Glucose-Capillary: 183 mg/dL — ABNORMAL HIGH (ref 70–99)
Glucose-Capillary: 189 mg/dL — ABNORMAL HIGH (ref 70–99)

## 2023-10-01 LAB — CBC
HCT: 25.6 % — ABNORMAL LOW (ref 36.0–46.0)
Hemoglobin: 8.7 g/dL — ABNORMAL LOW (ref 12.0–15.0)
MCH: 33.1 pg (ref 26.0–34.0)
MCHC: 34 g/dL (ref 30.0–36.0)
MCV: 97.3 fL (ref 80.0–100.0)
Platelets: 351 10*3/uL (ref 150–400)
RBC: 2.63 MIL/uL — ABNORMAL LOW (ref 3.87–5.11)
RDW: 12.5 % (ref 11.5–15.5)
WBC: 12.6 10*3/uL — ABNORMAL HIGH (ref 4.0–10.5)
nRBC: 0 % (ref 0.0–0.2)

## 2023-10-01 LAB — PROCALCITONIN: Procalcitonin: <0.1 ng/mL

## 2023-10-01 MED ORDER — FOLIC ACID 1 MG PO TABS
1.0000 mg | ORAL_TABLET | Freq: Every day | ORAL | Status: DC
  Administered 2023-10-01 – 2023-10-03 (×3): 1 mg via ORAL
  Filled 2023-10-01 (×3): qty 1

## 2023-10-01 MED ORDER — METOPROLOL TARTRATE 50 MG PO TABS
50.0000 mg | ORAL_TABLET | Freq: Two times a day (BID) | ORAL | Status: DC
  Administered 2023-10-01 – 2023-10-03 (×4): 50 mg via ORAL
  Filled 2023-10-01 (×4): qty 1

## 2023-10-01 MED ORDER — QUETIAPINE FUMARATE 25 MG PO TABS
12.5000 mg | ORAL_TABLET | Freq: Every day | ORAL | Status: DC
  Administered 2023-10-01 – 2023-10-02 (×2): 12.5 mg via ORAL
  Filled 2023-10-01 (×2): qty 1

## 2023-10-01 NOTE — Progress Notes (Signed)
 Patient more confused than in the morning.  Notified MD. New orders received.

## 2023-10-01 NOTE — Progress Notes (Signed)
 [facility]                                                                              Emily Blair, is a 78 y.o. female, DOB - 04-28-1946, ZOX:[id number] Admit date - 09/25/2023    Outpatient Primary MD for the patient is Sistasis, Lowell Guitar, MD  LOS - 6  days  Chief Complaint  Patient presents with   Altered Mental Status   Fall       Brief summary   Patient is a 78 year old female with a history of arthritis, bipolar disorder, HTN, osteoporosis sent from ALF due to acute left-sided weakness, aphasia and left hemineglect.  Facility noted that she had not been behaving normally at that time, had some confusion and then was found on the floor early morning with worsening confusion.  In ED, code stroke was called however CT perfusion and MRI were both negative.   Due to worsening mental status and concern for airway protection, she was electively intubated.  Workup was initiated, underwent continuous EEG and had no clinical seizures.  She was placed on low-dose Precedex in ICU. Transferred to [facility] on 09/29/2023, remains persistently confused.  Neurology was requested to reconsult again.    Assessment & Plan    Acute encephalopathy, left-sided weakness/left hemineglect Concern for seizure/prolonged postictal state -No prior history of seizures, workup negative with imaging including MRI. -At baseline, she is fairly independent, has mild dementia, able to go to the bathroom on her own.  Has history of falls, left upper humerus fracture. -Underwent LTM EEG, antibiotics have been discontinued -Still very confused, oriented to self, in mittens. -  Seen by neurology yesterday on 3/12, recommended to continue Tegretol, B12 replacement -Will try Seroquel 12.5 mg nightly    Acute Respiratory Failure due to aspiration pneumonia -Extubated on 3/9, now on room air -Completed IV Unasyn -Continue nebs, aspiration precautions  Severe B12 deficiency -B12 50, placed on vitamin B12  replacement -Added folic acid 1 mg daily  Hypokalemia -Replace as needed   ? Paroxysmal Atrial Fibrillation: Per PCCM New onset  -2D echo showed EF of 70 to 75% with no regional WMA, normal left ventricular diastolic parameters  - currently in NSR.   -Anticoagulation deferred due to history of falls -BP, HR elevated, increased Lopressor to 50 mg twice daily   Essential hypertension -Increased Lopressor to 50 mg twice daily  Hypophosphatemia -Replaced with K-Phos IV x 1   HLD:  -Continue statin   Bipolar and chronic pain & Arthritis:  -On oxycodone outpatient      Hx of Bipolar 1:  -Continue Tegretol    GERD/ -Continue Protonix   Normocytic Anemia:  -H&H stable, anemia panel showed iron 27, percent saturation ratio 9, ferritin 270, folate 7.6, B12 50   Estimated body mass index is 22.13 kg/m as calculated from the following:   Height as of this encounter: 5' 7.99" (1.727 m).   Weight as of this encounter: 66 kg.  Code Status: Full code DVT Prophylaxis:  enoxaparin (LOVENOX) injection 40 mg Start: 09/25/23 2200   Level of Care: Level of care: Progressive Family Communication:  Disposition Plan:  Remains inpatient appropriate:      Procedures:    Consultants:   Neurology  Antimicrobials:   Anti-infectives (From admission, onward)    Start     Dose/Rate Route Frequency Ordered Stop   09/26/23 1815  Ampicillin-Sulbactam (UNASYN) 3 g in sodium chloride 0.9 % 100 mL IVPB        3 g 200 mL/hr over 30 Minutes Intravenous Every 6 hours 09/26/23 1721 09/29/23 1811   09/25/23 2200  vancomycin (VANCOREADY) IVPB 750 mg/150 mL  Status:  Discontinued        750 mg 150 mL/hr over 60 Minutes Intravenous Every 12 hours 09/25/23 0939 09/26/23 1310   09/25/23 2000  cefTRIAXone (ROCEPHIN) 2 g in sodium chloride 0.9 % 100 mL IVPB  Status:  Discontinued        2 g 200 mL/hr over 30 Minutes Intravenous Every 12 hours 09/25/23 0939 09/26/23 1310   09/25/23 1500   ampicillin (OMNIPEN) 2 g in sodium chloride 0.9 % 100 mL IVPB  Status:  Discontinued        2 g 300 mL/hr over 20 Minutes Intravenous Every 6 hours 09/25/23 0939 09/25/23 1014   09/25/23 1415  oseltamivir (TAMIFLU) 6 MG/ML suspension 75 mg        75 mg Per Tube 2 times daily 09/25/23 1318 09/25/23 2204   09/25/23 1300  ampicillin (OMNIPEN) 2 g in sodium chloride 0.9 % 100 mL IVPB  Status:  Discontinued        2 g 300 mL/hr over 20 Minutes Intravenous Every 4 hours 09/25/23 1014 09/26/23 1310   09/25/23 1000  vancomycin (VANCOREADY) IVPB 500 mg/100 mL        500 mg 100 mL/hr over 60 Minutes Intravenous  Once 09/25/23 0947 09/25/23 1143   09/25/23 0745  cefTRIAXone (ROCEPHIN) 2 g in sodium chloride 0.9 % 100 mL IVPB        2 g 200 mL/hr over 30 Minutes Intravenous Once 09/25/23 0742 09/25/23 0837   09/25/23 0745  vancomycin (VANCOCIN) IVPB 1000 mg/200 mL premix        1,000 mg 200 mL/hr over 60 Minutes Intravenous  Once 09/25/23 0742 09/25/23 0946   09/25/23 0745  ampicillin (OMNIPEN) 2 g in sodium chloride 0.9 % 100 mL IVPB        2 g 300 mL/hr over 20 Minutes Intravenous  Once 09/25/23 0742 09/25/23 0946          Medications  carbamazepine  400 mg Oral QHS   Chlorhexidine Gluconate Cloth  6 each Topical Daily   cyanocobalamin  1,000 mcg Intramuscular Daily   Followed by   Melene Muller ON 10/07/2023] cyanocobalamin  1,000 mcg Intramuscular Weekly   enoxaparin (LOVENOX) injection  40 mg Subcutaneous QHS   feeding supplement  237 mL Oral BID BM   insulin aspart  0-15 Units Subcutaneous TID WC   metoprolol tartrate  25 mg Oral BID   pantoprazole  40 mg Oral BID   polyethylene glycol  17 g Oral Daily   senna-docusate  1 tablet Oral BID      Subjective:   Emily Blair was seen and examined today.  Very confused, oriented to self, has mittens on.  Difficult to obtain ROS from the patient, no fevers or chills.  No nausea vomiting abdominal pain.  Objective:   Vitals:   09/30/23 2340  10/01/23 0312 10/01/23 0413 10/01/23 0751  BP: (!) 152/95 (!) 190/116 (!) 163/107 (!) 159/94  Pulse: 83 (!) 116  91 94  Resp: 18 18    Temp: 98.3 F (36.8 C) 98.4 F (36.9 C)  98.9 F (37.2 C)  TempSrc:    Oral  SpO2: 100% 99%  99%  Weight:      Height:        Intake/Output Summary (Last 24 hours) at 10/01/2023 1038 Last data filed at 09/30/2023 1845 Gross per 24 hour  Intake 440.84 ml  Output 350 ml  Net 90.84 ml     Wt Readings from Last 3 Encounters:  09/29/23 66 kg    Physical Exam General: Alert, confused in mittens Cardiovascular: S1 S2 clear, RRR.  Respiratory: CTAB, no wheezing Gastrointestinal: Soft, nontender, nondistended, NBS Ext: no pedal edema bilaterally Neuro: left side appears weaker Psych: confused   Data Reviewed:  I have personally reviewed following labs    CBC Lab Results  Component Value Date   WBC 8.1 09/30/2023   RBC 3.75 (L) 09/30/2023   RBC 3.81 (L) 09/30/2023   HGB 12.4 09/30/2023   HCT 35.9 (L) 09/30/2023   MCV 95.7 09/30/2023   MCH 33.1 09/30/2023   PLT 332 09/30/2023   MCHC 34.5 09/30/2023   RDW 12.4 09/30/2023   LYMPHSABS 1.4 09/30/2023   MONOABS 0.8 09/30/2023   EOSABS 0.0 09/30/2023   BASOSABS 0.0 09/30/2023     Last metabolic panel Lab Results  Component Value Date   NA 131 (L) 10/01/2023   K 4.2 10/01/2023   CL 94 (L) 10/01/2023   CO2 26 10/01/2023   BUN 7 (L) 10/01/2023   CREATININE 0.47 10/01/2023   GLUCOSE 154 (H) 10/01/2023   GFRNONAA >60 10/01/2023   CALCIUM 10.0 10/01/2023   PHOS 2.9 09/30/2023   PROT 7.6 09/30/2023   ALBUMIN 3.5 09/30/2023   BILITOT 1.0 09/30/2023   ALKPHOS 138 (H) 09/30/2023   AST 21 09/30/2023   ALT 16 09/30/2023   ANIONGAP 11 10/01/2023    CBG (last 3)  Recent Labs    09/30/23 2212 10/01/23 0624 10/01/23 0810  GLUCAP 112* 162* 139*      Coagulation Profile: Recent Labs  Lab 09/25/23 0332  INR 1.0     Radiology Studies: I have personally reviewed the imaging  studies  DG CHEST PORT 1 VIEW Result Date: 09/30/2023 CLINICAL DATA:  Shortness of breath EXAM: PORTABLE CHEST 1 VIEW COMPARISON:  09/25/2023 FINDINGS: Endotracheal tube and gastric catheter have been removed in the interval. Lungs are well aerated bilaterally. Tortuous thoracic aorta is again seen. IMPRESSION: No active disease. Electronically Signed   By: Alcide Clever M.D.   On: 09/30/2023 10:41       Emily Blair M.D. [facility] 10/01/2023, 10:38 AM  Available via Epic secure chat 7am-7pm After 7 pm, please refer to night coverage provider listed on amion.

## 2023-10-01 NOTE — Plan of Care (Signed)
°  Problem: Clinical Measurements: Goal: Will remain free from infection Outcome: Progressing   Problem: Coping: Goal: Level of anxiety will decrease Outcome: Progressing   Problem: Elimination: Goal: Will not experience complications related to bowel motility Outcome: Progressing Goal: Will not experience complications related to urinary retention Outcome: Progressing

## 2023-10-01 NOTE — Progress Notes (Signed)
 NEUROLOGY CONSULT FOLLOW UP NOTE   Date of service: October 01, 2023 Patient Name: Emily Blair MRN:  [id number] DOB:  [birth year]  Interval Hx/subjective  NO family at the bedside. Patient is in bilateral mitts this morning.  Neurological exam is unchanged.  This am - Na 131  Vitals   Vitals:   09/30/23 2340 10/01/23 0312 10/01/23 0413 10/01/23 0751  BP: (!) 152/95 (!) 190/116 (!) 163/107 (!) 159/94  Pulse: 83 (!) 116 91 94  Resp: 18 18    Temp: 98.3 F (36.8 C) 98.4 F (36.9 C)  98.9 F (37.2 C)  TempSrc:    Oral  SpO2: 100% 99%  99%  Weight:      Height:         Body mass index is 22.13 kg/m.  Physical Exam   Constitutional: Appears well-developed and well-nourished.   Psych: Affect appropriate to situation.   Eyes: No scleral injection.   HENT: No OP obstrucion.   Head: Normocephalic.   Cardiovascular: Normal rate and regular rhythm.   Respiratory: Effort normal, non-labored breathing.   GI: Soft.  No distension. There is no tenderness.   Skin: WDI.    Neurologic Examination   Mental Status -  Patient is awake and alert. She is oriented to self, and date of birthdate. For age she states"32", and for month she states "May". She is unaware of situation or place. She is able to name some objects and repeat. She can follow simple commands  Cranial Nerves II - XII - II - Visual field intact OU. III, IV, VI - Extraocular movements intact. V - Facial sensation intact bilaterally. VII - Facial movement intact bilaterally. VIII - Hearing & vestibular intact bilaterally. X - Palate elevates symmetrically. XI - Chin turning & shoulder shrug intact bilaterally. XII - Tongue protrusion intact.  Motor Strength - left side appears to be weaker than right which is her baseline, right side 5/5.  Bulk was normal and fasciculations were absent .   Motor Tone - Muscle tone was assessed at the neck and appendages and was normal . Sensory - Light touch, temperature/pinprick were  assessed and were symmetrical.   Coordination - The patient had normal movements in the hands and feet with no ataxia or dysmetria.  Tremor was absent. Gait and Station - deferred.  Medications  Current Facility-Administered Medications:    acetaminophen (TYLENOL) tablet 650 mg, 650 mg, Per Tube, Q6H PRN, Lynnell Catalan, MD, 650 mg at 09/27/23 4098   carbamazepine (TEGRETOL XR) 12 hr tablet 400 mg, 400 mg, Oral, QHS, Calton Dach I, RPH, 400 mg at 09/30/23 2238   Chlorhexidine Gluconate Cloth 2 % PADS 6 each, 6 each, Topical, Daily, Agarwala, Ravi, MD, 6 each at 09/27/23 2230   cyanocobalamin (VITAMIN B12) injection 1,000 mcg, 1,000 mcg, Intramuscular, Daily, 1,000 mcg at 10/01/23 0904 **FOLLOWED BY** [START ON 10/07/2023] cyanocobalamin (VITAMIN B12) injection 1,000 mcg, 1,000 mcg, Intramuscular, Weekly, Jefferson Fuel, MD   enoxaparin (LOVENOX) injection 40 mg, 40 mg, Subcutaneous, QHS, Agarwala, Ravi, MD, 40 mg at 09/30/23 2229   feeding supplement (ENSURE ENLIVE / ENSURE PLUS) liquid 237 mL, 237 mL, Oral, BID BM, Chand, Sudham, MD, 237 mL at 10/01/23 0904   insulin aspart (novoLOG) injection 0-15 Units, 0-15 Units, Subcutaneous, TID WC, Calton Dach I, RPH, 2 Units at 10/01/23 0902   ipratropium (ATROVENT) nebulizer solution 0.5 mg, 0.5 mg, Nebulization, Q6H PRN, Sheikh, Omair Latif, DO   levalbuterol (XOPENEX) nebulizer solution 0.63 mg,  0.63 mg, Nebulization, Q6H PRN, Sheikh, Omair Latif, DO   LORazepam (ATIVAN) injection 2 mg, 2 mg, Intravenous, Once PRN, Selmer Dominion B, NP   melatonin tablet 5 mg, 5 mg, Oral, QHS PRN, John Giovanni, MD, 5 mg at 09/30/23 2227   metoprolol tartrate (LOPRESSOR) tablet 25 mg, 25 mg, Oral, BID, Simonne Martinet, NP, 25 mg at 10/01/23 0903   ondansetron (ZOFRAN) injection 4 mg, 4 mg, Intravenous, Q6H PRN, Simonne Martinet, NP, 4 mg at 09/28/23 1053   pantoprazole (PROTONIX) EC tablet 40 mg, 40 mg, Oral, BID, Ilda Basset, RPH, 40 mg at  10/01/23 4098   polyethylene glycol (MIRALAX / GLYCOLAX) packet 17 g, 17 g, Oral, Daily, Calton Dach I, RPH, 17 g at 09/30/23 0913   senna-docusate (Senokot-S) tablet 1 tablet, 1 tablet, Oral, BID, De Burrs, [facility], 1 tablet at 09/30/23 2227  Labs and Diagnostic Imaging   CBC:  Recent Labs  Lab 09/25/23 0332 09/25/23 0340 09/29/23 0524 09/30/23 0550  WBC 10.6*   < > 6.6 8.1  NEUTROABS 8.4*  --   --  5.9  HGB 14.0   < > 11.2* 12.4  HCT 40.2   < > 33.0* 35.9*  MCV 95.0   < > 96.8 95.7  PLT 367   < > 289 332   < > = values in this interval not displayed.    Basic Metabolic Panel:  Lab Results  Component Value Date   NA 131 (L) 10/01/2023   K 4.2 10/01/2023   CO2 26 10/01/2023   GLUCOSE 154 (H) 10/01/2023   BUN 7 (L) 10/01/2023   CREATININE 0.47 10/01/2023   CALCIUM 10.0 10/01/2023   GFRNONAA >60 10/01/2023   Lipid Panel: No results found for: "LDLCALC" HgbA1c: No results found for: "HGBA1C" Urine Drug Screen: No results found for: "LABOPIA", "COCAINSCRNUR", "LABBENZ", "AMPHETMU", "THCU", "LABBARB"  Alcohol Level     Component Value Date/Time   ETH <10 09/25/2023 0332   INR  Lab Results  Component Value Date   INR 1.0 09/25/2023   APTT  Lab Results  Component Value Date   APTT 24 09/25/2023   AED levels: No results found for: "PHENYTOIN", "ZONISAMIDE", "LAMOTRIGINE", "LEVETIRACETA"  MRI Brain(Personally reviewed): Negative   Labs: CSF RBC 16 CSF WBC one CSF protein 43 CSF glucose 94 CSF culture:  Negative BioFire meningitis/encephalitis panel-negative VDRL: non reactive   Labs  B12 50  Ammonia negative TSH 1.810 Folate 7.6 Na 1331   Assessment   Emily Blair is a 78 y.o. female with presentation of left-sided weakness and left hemineglect with negative imaging and workup.  My suspicion is she likely did have a seizure with prolonged postictal state.  There has been no other explanation for her acute focal neurological symptoms  found otherwise. CT/MRI showed no acute abnormality. 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 Amada Jupiter spoke with facility 3/9.  At baseline, she is fairly independent, able to go to the bathroom on her own, etc., but does have dementia.  Appears she is on oxycodone BID at home, recommend weaning this down as able. She has prior or fall and LUE humeral fracture. Denied pain on assessment today, but patient is confused at baseline, unsure of reliability of her as a historian. She is on Tegretol solely for mood stabilization, no history of episodes concerning for seizure. Tegretol level 3/8 was sub-therapeutic.    Impression: Seizure with prolonged post-ictal state in patient with no known history of  epilepsy secondary to low tegretol level/therefore questionable compliance, infectious process (aspiration pneumonia) lowering seizure threshold, ? secondary to intracranial Dermoid cyst.   Severe B12 deficiency no doubt contributing to her neuropsychiatric symptoms both currently and subacutely prior to admission. Continue aggressive supplementation.  Recommendations  - continue seizure precautions - continue home tegretol  - IM B12 supplementation to be given daily while hospitalized for up to 7 days. After that she should receive IM weekly x4 wks after which she should have a B12 level checked to guide further treatment.  - Continue to correct metabolic derangements - Avoid deliriogenic medications, including weaning down oxycodone at home.  - Recommend outpatient follow-up for Dermoid monitoring, repeat imaging per outpatient provider ______________________________________________________________________   Gevena Mart DNP, ACNPC-AG  [facility]   Attending Neurohospitalist Addendum Patient seen and examined with APP/Resident. Agree with the history and physical as documented above. Agree with the plan as documented, which I helped formulate. I have  edited the note above to reflect my full findings and recommendations. I have independently reviewed the chart, obtained history, review of systems and examined the patient.I have personally reviewed pertinent head/neck/spine imaging (CT/MRI). Please feel free to call with any questions.  -- Emily Neighbors, MD [facility] [phone]  If 7pm- 7am, please page neurology on call as listed in AMION.

## 2023-10-02 DIAGNOSIS — J9600 Acute respiratory failure, unspecified whether with hypoxia or hypercapnia: Secondary | ICD-10-CM | POA: Diagnosis not present

## 2023-10-02 DIAGNOSIS — R569 Unspecified convulsions: Secondary | ICD-10-CM | POA: Diagnosis not present

## 2023-10-02 DIAGNOSIS — F0390 Unspecified dementia without behavioral disturbance: Secondary | ICD-10-CM | POA: Diagnosis not present

## 2023-10-02 DIAGNOSIS — R531 Weakness: Secondary | ICD-10-CM | POA: Diagnosis not present

## 2023-10-02 DIAGNOSIS — I10 Essential (primary) hypertension: Secondary | ICD-10-CM | POA: Diagnosis not present

## 2023-10-02 DIAGNOSIS — E538 Deficiency of other specified B group vitamins: Secondary | ICD-10-CM | POA: Diagnosis not present

## 2023-10-02 LAB — CBC
HCT: 32.5 % — ABNORMAL LOW (ref 36.0–46.0)
Hemoglobin: 11.1 g/dL — ABNORMAL LOW (ref 12.0–15.0)
MCH: 33 pg (ref 26.0–34.0)
MCHC: 34.2 g/dL (ref 30.0–36.0)
MCV: 96.7 fL (ref 80.0–100.0)
Platelets: 331 10*3/uL (ref 150–400)
RBC: 3.36 MIL/uL — ABNORMAL LOW (ref 3.87–5.11)
RDW: 12.4 % (ref 11.5–15.5)
WBC: 7.2 10*3/uL (ref 4.0–10.5)
nRBC: 0 % (ref 0.0–0.2)

## 2023-10-02 LAB — GLUCOSE, CAPILLARY
Glucose-Capillary: 150 mg/dL — ABNORMAL HIGH (ref 70–99)
Glucose-Capillary: 124 mg/dL — ABNORMAL HIGH (ref 70–99)
Glucose-Capillary: 163 mg/dL — ABNORMAL HIGH (ref 70–99)
Glucose-Capillary: 157 mg/dL — ABNORMAL HIGH (ref 70–99)

## 2023-10-02 LAB — RENAL FUNCTION PANEL
Albumin: 2.8 g/dL — ABNORMAL LOW (ref 3.5–5.0)
Anion gap: 8 (ref 5–15)
BUN: 18 mg/dL (ref 8–23)
CO2: 29 mmol/L (ref 22–32)
Calcium: 10.4 mg/dL — ABNORMAL HIGH (ref 8.9–10.3)
Chloride: 93 mmol/L — ABNORMAL LOW (ref 98–111)
Creatinine, Ser: 0.76 mg/dL (ref 0.44–1.00)
GFR, Estimated: >60 mL/min (ref >60)
Glucose, Bld: 136 mg/dL — ABNORMAL HIGH (ref 70–99)
Phosphorus: 3.1 mg/dL (ref 2.5–4.6)
Potassium: 4.2 mmol/L (ref 3.5–5.1)
Sodium: 130 mmol/L — ABNORMAL LOW (ref 135–145)

## 2023-10-02 LAB — MAGNESIUM: Magnesium: 2.1 mg/dL (ref 1.7–2.4)

## 2023-10-02 NOTE — Progress Notes (Signed)
 Occupational Therapy Treatment Patient Details Name: Emily Blair MRN: [id number] DOB: 1945/09/23 Today's Date: 10/02/2023   History of present illness Pt is 78 year old presented [facility]  [facility]/23 for lt sided weakness, lt neglect and aphasia. CT, MRI, lumbar puncture negative.Suspicion is for unwitnessed seizure with prolonged postictal state. Intubated 3/7 for airway protection and extubated 3/9. PMH - dementia, arthritis, bipolar, htn, humerus fx   OT comments  Patient with increase alertness, less agitation/restlessness and improved ability to communicate her needs.  Oriented to self and place.   Attempted 3 different times in 3 different ways to get her out of bed, but despite attempts, patient refused OOB.  Limited session due to not OOB but far more engaged cognitively.  Patient will benefit from continued inpatient follow up therapy, <3 hours/day and continued acute OT      If plan is discharge home, recommend the following:  A lot of help with walking and/or transfers;A lot of help with bathing/dressing/bathroom;Assistance with cooking/housework;Direct supervision/assist for medications management;Direct supervision/assist for financial management;Assist for transportation;Supervision due to cognitive status;Help with stairs or ramp for entrance   Equipment Recommendations  None recommended by OT    Recommendations for Other Services      Precautions / Restrictions Precautions Precautions: Fall Recall of Precautions/Restrictions: Impaired Restrictions Weight Bearing Restrictions Per Provider Order: No       Mobility Bed Mobility                    Transfers                         Balance                                           ADL either performed or assessed with clinical judgement   ADL Overall ADL's : Needs assistance/impaired Eating/Feeding: Set up Eating/Feeding Details (indicate cue type and reason): Able to manage cup  without difficulty with R hand Grooming: Wash/dry face;Set up   Upper Body Bathing: Moderate assistance   Lower Body Bathing: +2 for safety/equipment;Sit to/from stand   Upper Body Dressing : Moderate assistance   Lower Body Dressing: +2 for safety/equipment;Sit to/from stand   Toilet Transfer: +2 for safety/equipment;BSC/3in1             General ADL Comments: Refused multiple attempts to get OOB today -    Extremity/Trunk Assessment Upper Extremity Assessment Upper Extremity Assessment: Generalized weakness;LUE deficits/detail LUE Deficits / Details: decreased rom on the L and pain with shoulder flexion   Lower Extremity Assessment Lower Extremity Assessment: Defer to PT evaluation        Vision   Additional Comments: Patient making eye contact and able to confirm call bell and nurse button   Perception     Praxis     Communication Communication Communication: Impaired Factors Affecting Communication: Difficulty expressing self   Cognition Arousal: Alert Behavior During Therapy: Restless Cognition: No family/caregiver present to determine baseline             OT - Cognition Comments: Much clearer today from evaluation.  Able to anserwer question more readily and oriented to person and place                 Following commands: Impaired Following commands impaired: Only follows one step commands consistently  Cueing   Cueing Techniques: Verbal cues, Gestural cues, Tactile cues  Exercises      Shoulder Instructions       General Comments      Pertinent Vitals/ Pain       Pain Assessment Pain Assessment: Faces Faces Pain Scale: Hurts a little bit Pain Location: patient stated her back and r houlder Pain Descriptors / Indicators: Discomfort Pain Intervention(s): Limited activity within patient's tolerance  Home Living                                          Prior Functioning/Environment               Frequency  Min 2X/week        Progress Toward Goals  OT Goals(current goals can now be found in the care plan section)  Progress towards OT goals: Progressing toward goals  Acute Rehab OT Goals Time For Goal Achievement: 10/13/23 ADL Goals Pt Will Perform Grooming: with min assist;sitting Additional ADL Goal #1: Pt will be able to sit EOB x 10 minutes with close supervision to participate in an adl activity Additional ADL Goal #2: Patient will complete an adl activity from start to finish with min verbal cues to complete  Plan      Co-evaluation                 AM-PAC OT "6 Clicks" Daily Activity     Outcome Measure   Help from another person eating meals?: A Little Help from another person taking care of personal grooming?: A Little Help from another person toileting, which includes using toliet, bedpan, or urinal?: A Lot Help from another person bathing (including washing, rinsing, drying)?: A Lot Help from another person to put on and taking off regular upper body clothing?: A Lot Help from another person to put on and taking off regular lower body clothing?: A Lot 6 Click Score: 14    End of Session    OT Visit Diagnosis: Unsteadiness on feet (R26.81);Muscle weakness (generalized) (M62.81);History of falling (Z91.81);Other symptoms and signs involving cognitive function;Other symptoms and signs involving the nervous system (R29.898);Pain Pain - Right/Left: Right Pain - part of body: Shoulder   Activity Tolerance Other (comment) (treatment limited by refusal to get OOB after multiple attempts)   Patient Left in bed;with call bell/phone within reach;with bed alarm set   Nurse Communication Mobility status        Time: 5366-4403 OT Time Calculation (min): 10 min  Charges: OT General Charges $OT Visit: 1 Visit OT Treatments $Self Care/Home Management : 8-22 mins  Hal Neer OTR/L   Emily Blair 10/02/2023, 4:47 PM

## 2023-10-02 NOTE — Progress Notes (Addendum)
 NEUROLOGY CONSULT FOLLOW UP NOTE   Date of service: October 02, 2023 Patient Name: Emily Blair MRN:  [id number] DOB:  16-Nov-1945  Interval Hx/subjective   Na 130. Blood culture pending. No family at bedside. Mental status much improved today.   Vitals   Vitals:   10/01/23 2301 10/02/23 0352 10/02/23 0354 10/02/23 0827  BP: (!) 90/58 112/73  (!) 146/91  Pulse: 84 69  85  Resp:    18  Temp: 98.6 F (37 C) 98 F (36.7 C)  98.1 F (36.7 C)  TempSrc: Axillary Axillary  Oral  SpO2: 97% 100%  100%  Weight:   63.7 kg   Height:         Body mass index is 21.36 kg/m.  Physical Exam   Constitutional: Appears well-developed and well-nourished.   Psych: Affect appropriate to situation.   Eyes: No scleral injection.   HENT: No OP obstrucion.   Head: Normocephalic.   Cardiovascular: Normal rate and regular rhythm.   Respiratory: Effort normal, non-labored breathing.   GI: Soft.  No distension. There is no tenderness.   Skin: WDI.    Neurologic Examination   Mental Status -  Patient is awake and alert. Oriented to self, place, month, year, birthdate. Naming and repetition intact. Follows simple commands. Some responses are slow.  CN:  Cranial Nerves II - XII - II - Visual field intact OU. III, IV, VI - Extraocular movements intact. V - Facial sensation intact bilaterally. VII - Facial movement intact bilaterally. VIII - Hearing & vestibular intact bilaterally. X - Palate elevates symmetrically. XI - Chin turning & shoulder shrug intact bilaterally. XII - Tongue protrusion intact. Motor Strength -  LUE/LLE: weaker at baseline RUE/RLE: 5/5, no drift.    Motor Tone - Muscle tone was assessed at the neck and appendages and was normal . Sensory - Light touch, temperature/pinprick were assessed and were symmetrical.   Coordination - The patient had normal movements in the hands and feet with no ataxia or dysmetria.  Tremor was absent. Gait and Station -  deferred.  Medications  Current Facility-Administered Medications:    acetaminophen (TYLENOL) tablet 650 mg, 650 mg, Per Tube, Q6H PRN, Lynnell Catalan, MD, 650 mg at 09/27/23 5284   carbamazepine (TEGRETOL XR) 12 hr tablet 400 mg, 400 mg, Oral, QHS, Calton Dach I, RPH, 400 mg at 10/01/23 2209   Chlorhexidine Gluconate Cloth 2 % PADS 6 each, 6 each, Topical, Daily, Agarwala, Daleen Bo, MD, 6 each at 10/01/23 2210   cyanocobalamin (VITAMIN B12) injection 1,000 mcg, 1,000 mcg, Intramuscular, Daily, 1,000 mcg at 10/01/23 0904 **FOLLOWED BY** [START ON 10/07/2023] cyanocobalamin (VITAMIN B12) injection 1,000 mcg, 1,000 mcg, Intramuscular, Weekly, Jefferson Fuel, MD   enoxaparin (LOVENOX) injection 40 mg, 40 mg, Subcutaneous, QHS, Agarwala, Daleen Bo, MD, 40 mg at 10/01/23 2210   feeding supplement (ENSURE ENLIVE / ENSURE PLUS) liquid 237 mL, 237 mL, Oral, BID BM, Cheri Fowler, MD, 237 mL at 10/01/23 1458   folic acid (FOLVITE) tablet 1 mg, 1 mg, Oral, Daily, Rai, Ripudeep K, MD, 1 mg at 10/01/23 1141   insulin aspart (novoLOG) injection 0-15 Units, 0-15 Units, Subcutaneous, TID WC, Calton Dach I, RPH, 3 Units at 10/01/23 1734   ipratropium (ATROVENT) nebulizer solution 0.5 mg, 0.5 mg, Nebulization, Q6H PRN, Sheikh, Omair Latif, DO   levalbuterol [facility]) nebulizer solution 0.63 mg, 0.63 mg, Nebulization, Q6H PRN, Sheikh, Omair Latif, DO   LORazepam (ATIVAN) injection 2 mg, 2 mg, Intravenous, Once PRN, Selmer Dominion  B, NP   melatonin tablet 5 mg, 5 mg, Oral, QHS PRN, John Giovanni, MD, 5 mg at 10/01/23 2209   metoprolol tartrate (LOPRESSOR) tablet 50 mg, 50 mg, Oral, BID, Rai, Ripudeep K, MD, 50 mg at 10/01/23 2209   ondansetron (ZOFRAN) injection 4 mg, 4 mg, Intravenous, Q6H PRN, Simonne Martinet, NP, 4 mg at 09/28/23 1053   pantoprazole (PROTONIX) EC tablet 40 mg, 40 mg, Oral, BID, Ilda Basset, RPH, 40 mg at 10/01/23 2208   polyethylene glycol (MIRALAX / GLYCOLAX) packet 17 g, 17  g, Oral, Daily, Calton Dach I, RPH, 17 g at 09/30/23 0913   QUEtiapine (SEROQUEL) tablet 12.5 mg, 12.5 mg, Oral, QHS, Rai, Ripudeep K, MD, 12.5 mg at 10/01/23 2209   senna-docusate (Senokot-S) tablet 1 tablet, 1 tablet, Oral, BID, De Burrs, [facility], 1 tablet at 10/01/23 2209  Labs and Diagnostic Imaging   CBC:  Recent Labs  Lab 09/30/23 0550 10/01/23 1823  WBC 8.1 12.6*  NEUTROABS 5.9  --   HGB 12.4 8.7*  HCT 35.9* 25.6*  MCV 95.7 97.3  PLT 332 351    Basic Metabolic Panel:  Lab Results  Component Value Date   NA 130 (L) 10/02/2023   K 4.2 10/02/2023   CO2 29 10/02/2023   GLUCOSE 136 (H) 10/02/2023   BUN 18 10/02/2023   CREATININE 0.76 10/02/2023   CALCIUM 10.4 (H) 10/02/2023   GFRNONAA >60 10/02/2023   Lipid Panel: No results found for: "LDLCALC" HgbA1c: No results found for: "HGBA1C" Urine Drug Screen: No results found for: "LABOPIA", "COCAINSCRNUR", "LABBENZ", "AMPHETMU", "THCU", "LABBARB"  Alcohol Level     Component Value Date/Time   ETH <10 09/25/2023 0332   INR  Lab Results  Component Value Date   INR 1.0 09/25/2023   APTT  Lab Results  Component Value Date   APTT 24 09/25/2023   AED levels: No results found for: "PHENYTOIN", "ZONISAMIDE", "LAMOTRIGINE", "LEVETIRACETA"  MRI Brain(Personally reviewed): Negative   Labs: CSF RBC 16 CSF WBC one CSF protein 43 CSF glucose 94 CSF culture:  Negative BioFire meningitis/encephalitis panel-negative VDRL: non reactive   Labs  B12 50  Ammonia negative TSH 1.810 Folate 7.6 RPR Neg   Assessment   Emily Blair is a 78 y.o. female with presentation of left-sided weakness and left hemineglect with negative imaging and workup.  My suspicion is she likely did have a seizure with prolonged postictal state.  There has been no other explanation for her acute focal neurological symptoms found otherwise. CT/MRI showed no acute abnormality. 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  Appears she is on oxycodone BID at home, recommend weaning this down as able. She has prior or fall and LUE humeral fracture. Denied pain on assessment, but patient is confused at baseline, unsure of reliability of her as a historian. She is on Tegretol solely for mood stabilization, no history of episodes concerning for seizure. Tegretol level 3/8 was sub-therapeutic.   B12 supplementation was started due to severe deficiency. Expect this to continue to help correct altered mental status, but this can take a few days/weeks to truly show improvement.    Impression: Seizure with prolonged post-ictal state in patient with no known history of epilepsy secondary to low tegretol level/therefore questionable compliance, infectious process (aspiration pneumonia) lowering seizure threshold, ? secondary to intracranial Dermoid cyst.  Severe B12 deficiency no doubt contributing to her neuropsychiatric symptoms both currently  and subacutely prior to admission. She has made substantial improvement in her mental status since her presentation, continue current treatment plan.   Recommendations  - continue seizure precautions - continue home tegretol  - Continue aggressive supplementation.of B12. IM B12 supplementation to be given daily while hospitalized for up to 7 days. After that she should receive IM weekly x4 wks after which she should have a B12 level checked to guide further treatment.  - Continue to correct metabolic derangements - Avoid deliriogenic medications, including weaning down oxycodone at home.  - Recommend outpatient follow-up for Dermoid monitoring, repeat imaging per outpatient provider - I will arrange outpatient neurology f/u - Neurology to sign off but please re-engage if additional neurologic concerns arise  ______________________________________________________________________    Pt seen by Neuro NP/APP and later by  MD. Note/plan to be edited by MD as needed.    Emily January, DNP, AGACNP-BC [facility] Please use AMION for contact information & EPIC for messaging.    Attending Neurohospitalist Addendum Patient seen and examined with APP/Resident. Agree with the history and physical as documented above. Agree with the plan as documented, which I helped formulate. I have edited the note above to reflect my full findings and recommendations. I have independently reviewed the chart, obtained history, review of systems and examined the patient.I have personally reviewed pertinent head/neck/spine imaging (CT/MRI). Please feel free to call with any questions.  -- Bing Neighbors, MD [facility] [phone]  If 7pm- 7am, please page neurology on call as listed in AMION.

## 2023-10-02 NOTE — Plan of Care (Signed)
   Problem: Safety: Goal: Ability to remain free from injury will improve Outcome: Progressing   Problem: Skin Integrity: Goal: Risk for impaired skin integrity will decrease Outcome: Progressing

## 2023-10-02 NOTE — Plan of Care (Signed)
  Problem: Education: Goal: Knowledge of General Education information will improve Description: Including pain rating scale, medication(s)/side effects and non-pharmacologic comfort measures Outcome: Not Progressing   Problem: Health Behavior/Discharge Planning: Goal: Ability to manage health-related needs will improve Outcome: Not Progressing   Problem: Clinical Measurements: Goal: Ability to maintain clinical measurements within normal limits will improve Outcome: Not Progressing Goal: Will remain free from infection Outcome: Not Progressing Goal: Diagnostic test results will improve Outcome: Not Progressing Goal: Cardiovascular complication will be avoided Outcome: Not Progressing   Problem: Activity: Goal: Risk for activity intolerance will decrease Outcome: Not Progressing   Problem: Nutrition: Goal: Adequate nutrition will be maintained Outcome: Not Progressing   Problem: Coping: Goal: Level of anxiety will decrease Outcome: Not Progressing   Problem: Elimination: Goal: Will not experience complications related to bowel motility Outcome: Not Progressing Goal: Will not experience complications related to urinary retention Outcome: Not Progressing   Problem: Pain Managment: Goal: General experience of comfort will improve and/or be controlled Outcome: Progressing   Problem: Safety: Goal: Ability to remain free from injury will improve Outcome: Not Progressing   Problem: Skin Integrity: Goal: Risk for impaired skin integrity will decrease Outcome: Not Progressing

## 2023-10-02 NOTE — Progress Notes (Signed)
 [facility]                                                                              Emily Blair, is a 78 y.o. female, DOB - 1946/06/04, ZOX:[id number] Admit date - 09/25/2023    Outpatient Primary MD for the patient is Sistasis, Lowell Guitar, MD  LOS - 7  days  Chief Complaint  Patient presents with   Altered Mental Status   Fall       Brief summary   Patient is a 78 year old female with a history of arthritis, bipolar disorder, HTN, osteoporosis sent from ALF due to acute left-sided weakness, aphasia and left hemineglect.  Facility noted that she had not been behaving normally at that time, had some confusion and then was found on the floor early morning with worsening confusion.  In ED, code stroke was called however CT perfusion and MRI were both negative.   Due to worsening mental status and concern for airway protection, she was electively intubated.  Workup was initiated, underwent continuous EEG and had no clinical seizures.  She was placed on low-dose Precedex in ICU. Transferred to [facility] on 09/29/2023, remains persistently confused.  Neurology was requested to reconsult again.    Assessment & Plan    Acute encephalopathy, left-sided weakness/left hemineglect Concern for seizure/prolonged postictal state -No prior history of seizures, workup negative with imaging including MRI. -At baseline, she is fairly independent, has mild dementia, able to go to the bathroom on her own.  Has history of falls, left upper humerus fracture. -Underwent LTM EEG, antibiotics have been discontinued -Neurology following, recommended to continue B12 replacement, Tegretol  -Today mental status much improved from yesterday, continue Seroquel 12.5 mg daily at bedtime    Acute Respiratory Failure due to aspiration pneumonia -Extubated on 3/9, now on room air -Completed IV Unasyn -Continue nebs, aspiration precautions  Severe B12 deficiency -B12 50, placed on vitamin B12  replacement -Added folic acid 1 mg daily  Hypokalemia -Replace as needed   ? Paroxysmal Atrial Fibrillation: Per PCCM New onset  -2D echo showed EF of 70 to 75% with no regional WMA, normal left ventricular diastolic parameters  - currently in NSR.   -Anticoagulation deferred due to history of falls -Continue Lopressor 50 mg twice daily   Essential hypertension -BP better, continue Lopressor   Hypophosphatemia -Received K-Phos    HLD:  -Continue statin   Bipolar and chronic pain & Arthritis:  -On oxycodone outpatient      Hx of Bipolar 1:  -Continue Tegretol    GERD/ -Continue Protonix   Normocytic Anemia:  -H&H stable, anemia panel showed iron 27, percent saturation ratio 9, ferritin 270, folate 7.6, B12 50   Estimated body mass index is 21.36 kg/m as calculated from the following:   Height as of this encounter: 5' 7.99" (1.727 m).   Weight as of this encounter: 63.7 kg.  Code Status: Full code DVT Prophylaxis:  enoxaparin (LOVENOX) injection 40 mg Start: 09/25/23 2200   Level of Care: Level of care: Progressive Family Communication:  Disposition Plan:      Remains inpatient appropriate:      Procedures:  Consultants:   Neurology  Antimicrobials:   Anti-infectives (From admission, onward)    Start     Dose/Rate Route Frequency Ordered Stop   09/26/23 1815  Ampicillin-Sulbactam (UNASYN) 3 g in sodium chloride 0.9 % 100 mL IVPB        3 g 200 mL/hr over 30 Minutes Intravenous Every 6 hours 09/26/23 1721 09/29/23 1811   09/25/23 2200  vancomycin (VANCOREADY) IVPB 750 mg/150 mL  Status:  Discontinued        750 mg 150 mL/hr over 60 Minutes Intravenous Every 12 hours 09/25/23 0939 09/26/23 1310   09/25/23 2000  cefTRIAXone (ROCEPHIN) 2 g in sodium chloride 0.9 % 100 mL IVPB  Status:  Discontinued        2 g 200 mL/hr over 30 Minutes Intravenous Every 12 hours 09/25/23 0939 09/26/23 1310   09/25/23 1500  ampicillin (OMNIPEN) 2 g in sodium chloride  0.9 % 100 mL IVPB  Status:  Discontinued        2 g 300 mL/hr over 20 Minutes Intravenous Every 6 hours 09/25/23 0939 09/25/23 1014   09/25/23 1415  oseltamivir (TAMIFLU) 6 MG/ML suspension 75 mg        75 mg Per Tube 2 times daily 09/25/23 1318 09/25/23 2204   09/25/23 1300  ampicillin (OMNIPEN) 2 g in sodium chloride 0.9 % 100 mL IVPB  Status:  Discontinued        2 g 300 mL/hr over 20 Minutes Intravenous Every 4 hours 09/25/23 1014 09/26/23 1310   09/25/23 1000  vancomycin (VANCOREADY) IVPB 500 mg/100 mL        500 mg 100 mL/hr over 60 Minutes Intravenous  Once 09/25/23 0947 09/25/23 1143   09/25/23 0745  cefTRIAXone (ROCEPHIN) 2 g in sodium chloride 0.9 % 100 mL IVPB        2 g 200 mL/hr over 30 Minutes Intravenous Once 09/25/23 0742 09/25/23 0837   09/25/23 0745  vancomycin (VANCOCIN) IVPB 1000 mg/200 mL premix        1,000 mg 200 mL/hr over 60 Minutes Intravenous  Once 09/25/23 0742 09/25/23 0946   09/25/23 0745  ampicillin (OMNIPEN) 2 g in sodium chloride 0.9 % 100 mL IVPB        2 g 300 mL/hr over 20 Minutes Intravenous  Once 09/25/23 0742 09/25/23 0946          Medications  carbamazepine  400 mg Oral QHS   Chlorhexidine Gluconate Cloth  6 each Topical Daily   cyanocobalamin  1,000 mcg Intramuscular Daily   Followed by   Melene Muller ON 10/07/2023] cyanocobalamin  1,000 mcg Intramuscular Weekly   enoxaparin (LOVENOX) injection  40 mg Subcutaneous QHS   feeding supplement  237 mL Oral BID BM   folic acid  1 mg Oral Daily   insulin aspart  0-15 Units Subcutaneous TID WC   metoprolol tartrate  50 mg Oral BID   pantoprazole  40 mg Oral BID   polyethylene glycol  17 g Oral Daily   QUEtiapine  12.5 mg Oral QHS   senna-docusate  1 tablet Oral BID      Subjective:   Lacrystal Barbe was seen and examined today.  Somewhat more alert and oriented today, eating breakfast without any assistance.  Slow responses but able to answer some questions questions appropriately  today.  Objective:   Vitals:   10/02/23 0354 10/02/23 0827 10/02/23 1030 10/02/23 1141  BP:  (!) 146/91  (!) 129/98  Pulse:  85 (!)  103 87  Resp:  18  18  Temp:  98.1 F (36.7 C)  97.7 F (36.5 C)  TempSrc:  Oral  Oral  SpO2:  100%  98%  Weight: 63.7 kg     Height:        Intake/Output Summary (Last 24 hours) at 10/02/2023 1310 Last data filed at 10/02/2023 1000 Gross per 24 hour  Intake 120 ml  Output 700 ml  Net -580 ml     Wt Readings from Last 3 Encounters:  10/02/23 63.7 kg   Physical Exam General: Alert and oriented x self, time Cardiovascular: S1 S2 clear, RRR.  Respiratory: CTAB, no wheezing Gastrointestinal: Soft, nontender, nondistended, NBS Ext: no pedal edema bilaterally Neuro: no new deficits Psych: mental status better today  Data Reviewed:  I have personally reviewed following labs    CBC Lab Results  Component Value Date   WBC 7.2 10/02/2023   RBC 3.36 (L) 10/02/2023   HGB 11.1 (L) 10/02/2023   HCT 32.5 (L) 10/02/2023   MCV 96.7 10/02/2023   MCH 33.0 10/02/2023   PLT 331 10/02/2023   MCHC 34.2 10/02/2023   RDW 12.4 10/02/2023   LYMPHSABS 1.4 09/30/2023   MONOABS 0.8 09/30/2023   EOSABS 0.0 09/30/2023   BASOSABS 0.0 09/30/2023     Last metabolic panel Lab Results  Component Value Date   NA 130 (L) 10/02/2023   K 4.2 10/02/2023   CL 93 (L) 10/02/2023   CO2 29 10/02/2023   BUN 18 10/02/2023   CREATININE 0.76 10/02/2023   GLUCOSE 136 (H) 10/02/2023   GFRNONAA >60 10/02/2023   CALCIUM 10.4 (H) 10/02/2023   PHOS 3.1 10/02/2023   PROT 7.6 09/30/2023   ALBUMIN 2.8 (L) 10/02/2023   BILITOT 1.0 09/30/2023   ALKPHOS 138 (H) 09/30/2023   AST 21 09/30/2023   ALT 16 09/30/2023   ANIONGAP 8 10/02/2023    CBG (last 3)  Recent Labs    10/01/23 2251 10/02/23 0645 10/02/23 1242  GLUCAP 189* 150* 124*      Coagulation Profile: No results for input(s): "INR", "PROTIME" in the last 168 hours.    Radiology Studies: I have  personally reviewed the imaging studies  DG CHEST PORT 1 VIEW Result Date: 10/01/2023 CLINICAL DATA:  Fever EXAM: PORTABLE CHEST 1 VIEW COMPARISON:  09/30/2023. FINDINGS: The heart size and mediastinal contours are within normal limits. Tortuous and ectatic aorta. Mild basilar linear opacity bilateral, likely scar or atelectasis. Chronic deformity left proximal humerus. Overlapping cardiac leads IMPRESSION: Basilar atelectasis.  Tortuous aorta. Electronically Signed   By: Karen Kays M.D.   On: 10/01/2023 20:23       Velmer Woelfel M.D. [facility] 10/02/2023, 1:10 PM  Available via Epic secure chat 7am-7pm After 7 pm, please refer to night coverage provider listed on amion.

## 2023-10-02 NOTE — Progress Notes (Signed)
 SLP Cancellation Note  Patient Details Name: Emily Blair MRN: [id number] DOB: 12-Dec-1945   Cancelled treatment:       Reason Eval/Treat Not Completed: SLP screened, no needs identified, will sign off. Discussed with MD, pt passed swallow screen, tolerating diet. No need for SLP eval, will d/c order   Danilo Cappiello, Riley Nearing 10/02/2023, 2:17 PM

## 2023-10-02 NOTE — Progress Notes (Signed)
 0800 patient alert to place and self only able to make some needs known patient easy to re orientate

## 2023-10-02 NOTE — Progress Notes (Signed)
 Physical Therapy Treatment Patient Details Name: Emily Blair MRN: [id number] DOB: 09/11/1945 Today's Date: 10/02/2023   History of Present Illness Pt is 78 year old presented to [facility] on  09/25/23 for lt sided weakness, lt neglect and aphasia. CT, MRI, lumbar puncture negative.Suspicion is for unwitnessed seizure with prolonged postictal state. Intubated 3/7 for airway protection and extubated 3/9. PMH - dementia, arthritis, bipolar, htn, humerus fx    PT Comments  Pt continues to fluctuate in improvement; slow progress towards goals. Pt able to perform transfer today at Max A to Total A from EOB<> BSC. Pt is supervision to CGA for bed mobility and Min A consistently for sit to stand with AD and HHA. Due to pt current functional status, home set up and available assistance at home recommending skilled physical therapy services < 3 hours/day in order to address strength, balance and functional mobility to decrease risk for falls, injury, immobility, skin break down and re-hospitalization.      If plan is discharge home, recommend the following: A lot of help with walking and/or transfers;A lot of help with bathing/dressing/bathroom;Direct supervision/assist for medications management;Assist for transportation   Can travel by private vehicle     No  Equipment Recommendations  Wheelchair (measurements PT);Wheelchair cushion (measurements PT)       Precautions / Restrictions Precautions Precautions: Fall Recall of Precautions/Restrictions: Impaired Restrictions Weight Bearing Restrictions Per Provider Order: No     Mobility  Bed Mobility Overal bed mobility: Needs Assistance Bed Mobility: Supine to Sit, Sit to Supine     Supine to sit: Supervision Sit to supine: Contact guard assist   General bed mobility comments: verbal cues to initiate movement with visual cues for sitting to supine for appropriate positioning of head    Transfers Overall transfer level: Needs assistance Equipment  used: Rolling walker (2 wheels), 1 person hand held assist Transfers: Sit to/from Stand, Bed to chair/wheelchair/BSC Sit to Stand: Min assist Stand pivot transfers: Max assist, Total assist         General transfer comment: Pt was Max A for transfer from EOB to [facility] and Total from BSC to EOB due to poor sequencing and difficulty following directions. Pt is impulsive randomly trying to grab for objects and trying to sit.    Ambulation/Gait             Pre-gait activities: Pt was able to take side steps at EOB wiht RW and initiate stepping toward BSC. Pt then disregarded instructions and was unable to follow creating an unsafe movement requiring increased assist and unable to progress gait.       Balance Overall balance assessment: Needs assistance Sitting-balance support: No upper extremity supported, Feet supported Sitting balance-Leahy Scale: Fair Sitting balance - Comments: Very close SBA sitting EOB Postural control: Posterior lean Standing balance support: Bilateral upper extremity supported, Single extremity supported, During functional activity Standing balance-Leahy Scale: Poor Standing balance comment: posterior lean relies on external support to maintain balance.        Communication Communication Communication: Impaired Factors Affecting Communication: Difficulty expressing self  Cognition Arousal: Alert Behavior During Therapy: Impulsive   PT - Cognitive impairments: Difficult to assess, Attention, Initiation, Problem solving, Sequencing, Awareness, Safety/Judgement Difficult to assess due to: Impaired communication     PT - Cognition Comments: poor sequencing Following commands: Impaired Following commands impaired: Follows one step commands inconsistently    Cueing Cueing Techniques: Verbal cues, Gestural cues, Tactile cues     General Comments General comments (skin integrity,  edema, etc.): bruising on bil hands. Pt friend present.      Pertinent  Vitals/Pain Pain Assessment Pain Assessment: Faces Faces Pain Scale: Hurts a little bit Breathing: normal Negative Vocalization: none Facial Expression: smiling or inexpressive Body Language: tense, distressed pacing, fidgeting Consolability: no need to console PAINAD Score: 1 Facial Expression: Relaxed, neutral Body Movements: Absence of movements Muscle Tension: Relaxed Compliance with ventilator (intubated pts.): N/A Vocalization (extubated pts.): Talking in normal tone or no sound CPOT Total: 0 Pain Location: L cervical spine Pain Descriptors / Indicators: Discomfort Pain Intervention(s): Monitored during session     PT Goals (current goals can now be found in the care plan section) Acute Rehab PT Goals Patient Stated Goal: unable to state PT Goal Formulation: Patient unable to participate in goal setting Time For Goal Achievement: 10/12/23 Potential to Achieve Goals: Fair Progress towards PT goals: Progressing toward goals    Frequency    Min 2X/week      PT Plan  Continue with current POC        AM-PAC PT "6 Clicks" Mobility   Outcome Measure  Help needed turning from your back to your side while in a flat bed without using bedrails?: A Little Help needed moving from lying on your back to sitting on the side of a flat bed without using bedrails?: A Little Help needed moving to and from a bed to a chair (including a wheelchair)?: A Little Help needed standing up from a chair using your arms (e.g., wheelchair or bedside chair)?: A Little Help needed to walk in hospital room?: Total Help needed climbing 3-5 steps with a railing? : Total 6 Click Score: 14    End of Session Equipment Utilized During Treatment: Gait belt Activity Tolerance: Patient tolerated treatment well Patient left: in bed;with call bell/phone within reach;with bed alarm set;with family/visitor present Nurse Communication: Mobility status PT Visit Diagnosis: Unsteadiness on feet  (R26.81);Other abnormalities of gait and mobility (R26.89);Muscle weakness (generalized) (M62.81);Difficulty in walking, not elsewhere classified (R26.2);Other symptoms and signs involving the nervous system (R29.898)     Time: 1610-9604 PT Time Calculation (min) (ACUTE ONLY): 33 min  Charges:    $Therapeutic Activity: 23-37 mins PT General Charges $$ ACUTE PT VISIT: 1 Visit                     Harrel Carina, DPT, CLT  Acute Rehabilitation Services Office: [phone] (Secure chat preferred)    Claudia Desanctis 10/02/2023, 4:55 PM

## 2023-10-02 NOTE — TOC Progression Note (Signed)
 Transition of Care [facility]) - Progression Note    Patient Details  Name: Emily Blair MRN: [id number] Date of Birth: May 27, 1946  [facility] CM/SW Contact  Baldemar Lenis, [place] Phone Number: 10/02/2023, 4:13 PM  Clinical Narrative:   CSW updated by MD that patient may be stable over the weekend. Patient is approved for SNF until 10/05/23, and CSW confirmed with [facility] that patient could return over the weekend if stable. CSW to follow.    Expected Discharge Plan: Skilled Nursing Facility    Expected Discharge Plan and Services                                               Social Determinants of Health (SDOH) Interventions SDOH Screenings   Food Insecurity: Patient Unable To Answer (09/27/2023)  Housing: Patient Unable To Answer (09/27/2023)  Transportation Needs: Patient Unable To Answer (09/27/2023)  Utilities: Patient Unable To Answer (09/27/2023)  Social Connections: Patient Unable To Answer (09/27/2023)  Tobacco Use: Unknown (10/01/2023)    Readmission Risk Interventions     No data to display

## 2023-10-03 DIAGNOSIS — I10 Essential (primary) hypertension: Secondary | ICD-10-CM | POA: Diagnosis not present

## 2023-10-03 DIAGNOSIS — E7800 Pure hypercholesterolemia, unspecified: Secondary | ICD-10-CM | POA: Diagnosis not present

## 2023-10-03 DIAGNOSIS — E538 Deficiency of other specified B group vitamins: Secondary | ICD-10-CM | POA: Diagnosis not present

## 2023-10-03 DIAGNOSIS — I480 Paroxysmal atrial fibrillation: Secondary | ICD-10-CM | POA: Diagnosis not present

## 2023-10-03 LAB — GLUCOSE, CAPILLARY
Glucose-Capillary: 124 mg/dL — ABNORMAL HIGH (ref 70–99)
Glucose-Capillary: 142 mg/dL — ABNORMAL HIGH (ref 70–99)

## 2023-10-03 LAB — CBC
HCT: 32.1 % — ABNORMAL LOW (ref 36.0–46.0)
Hemoglobin: 10.9 g/dL — ABNORMAL LOW (ref 12.0–15.0)
MCH: 32.6 pg (ref 26.0–34.0)
MCHC: 34 g/dL (ref 30.0–36.0)
MCV: 96.1 fL (ref 80.0–100.0)
Platelets: 282 10*3/uL (ref 150–400)
RBC: 3.34 MIL/uL — ABNORMAL LOW (ref 3.87–5.11)
RDW: 12.4 % (ref 11.5–15.5)
WBC: 6.6 10*3/uL (ref 4.0–10.5)
nRBC: 0 % (ref 0.0–0.2)

## 2023-10-03 LAB — RENAL FUNCTION PANEL
Albumin: 2.8 g/dL — ABNORMAL LOW (ref 3.5–5.0)
Anion gap: 12 (ref 5–15)
BUN: 21 mg/dL (ref 8–23)
CO2: 23 mmol/L (ref 22–32)
Calcium: 10.3 mg/dL (ref 8.9–10.3)
Chloride: 96 mmol/L — ABNORMAL LOW (ref 98–111)
Creatinine, Ser: 0.7 mg/dL (ref 0.44–1.00)
GFR, Estimated: >60 mL/min (ref >60)
Glucose, Bld: 124 mg/dL — ABNORMAL HIGH (ref 70–99)
Phosphorus: 2.9 mg/dL (ref 2.5–4.6)
Potassium: 4.7 mmol/L (ref 3.5–5.1)
Sodium: 131 mmol/L — ABNORMAL LOW (ref 135–145)

## 2023-10-03 MED ORDER — CYANOCOBALAMIN 1000 MCG/ML IJ SOLN: INTRAMUSCULAR | Status: DC

## 2023-10-03 MED ORDER — MELATONIN 3 MG PO TABS: 3.0000 mg | ORAL_TABLET | Freq: Every evening | ORAL | Status: DC | PRN

## 2023-10-03 MED ORDER — SENNOSIDES-DOCUSATE SODIUM 8.6-50 MG PO TABS: 1.0000 | ORAL_TABLET | Freq: Two times a day (BID) | ORAL | Status: AC

## 2023-10-03 MED ORDER — PANTOPRAZOLE SODIUM 40 MG PO TBEC: 40.0000 mg | DELAYED_RELEASE_TABLET | Freq: Two times a day (BID) | ORAL | Status: AC

## 2023-10-03 MED ORDER — FOLIC ACID 1 MG PO TABS: 1.0000 mg | ORAL_TABLET | Freq: Every day | ORAL | Status: AC

## 2023-10-03 MED ORDER — POLYETHYLENE GLYCOL 3350 17 G PO PACK: 17.0000 g | PACK | Freq: Every day | ORAL | Status: AC

## 2023-10-03 MED ORDER — QUETIAPINE FUMARATE 25 MG PO TABS: 12.5000 mg | ORAL_TABLET | Freq: Every day | ORAL | 0 refills | Status: DC

## 2023-10-03 MED ORDER — LEVALBUTEROL HCL 0.63 MG/3ML IN NEBU: 0.6300 mg | INHALATION_SOLUTION | Freq: Four times a day (QID) | RESPIRATORY_TRACT | Status: AC | PRN

## 2023-10-03 MED ORDER — METOPROLOL TARTRATE 50 MG PO TABS: 50.0000 mg | ORAL_TABLET | Freq: Two times a day (BID) | ORAL | Status: AC

## 2023-10-03 NOTE — Plan of Care (Signed)
  Problem: Clinical Measurements: Goal: Ability to maintain clinical measurements within normal limits will improve Outcome: Progressing Goal: Will remain free from infection Outcome: Progressing Goal: Cardiovascular complication will be avoided Outcome: Progressing   Problem: Elimination: Goal: Will not experience complications related to urinary retention Outcome: Progressing   Problem: Pain Managment: Goal: General experience of comfort will improve and/or be controlled Outcome: Progressing   Problem: Education: Goal: Knowledge of General Education information will improve Description: Including pain rating scale, medication(s)/side effects and non-pharmacologic comfort measures Outcome: Not Progressing   Problem: Activity: Goal: Risk for activity intolerance will decrease Outcome: Not Progressing

## 2023-10-03 NOTE — TOC Transition Note (Signed)
 Transition of Care [facility]) - Discharge Note   Patient Details  Name: Emily Blair MRN: [id number] Date of Birth: Jun 25, 1946  [facility]) CM/SW Contact:  Deatra Robinson, [place] Phone Number: 10/03/2023, 10:49 AM   Clinical Narrative: Pt for dc back to [facility] where she is a STR/SNF resident. Spoke to Lauren in admissions who confirmed they are prepared to admit pt to room 406-2. Pt's sister Corrie Dandy aware of dc and requests a new room at [facility]. Notified Lauren of request. RN provided with number for report and PTAR arranged for transport. SW signing off at dc.   Dellie Burns, MSW, LCSW [phone] (coverage)      Final next level of care: Skilled Nursing Facility Barriers to Discharge: Barriers Resolved   Patient Goals and CMS Choice            Discharge Placement              Patient chooses bed at: Other - please specify in the comment section below: ([facility]) Patient to be transferred to facility by: PTAR Name of family member notified: Mary/Sister Patient and family notified of of transfer: 10/03/23  Discharge Plan and Services Additional resources added to the After Visit Summary for                                       Social Drivers of Health (SDOH) Interventions SDOH Screenings   Food Insecurity: Patient Unable To Answer (09/27/2023)  Housing: Patient Unable To Answer (09/27/2023)  Transportation Needs: Patient Unable To Answer (09/27/2023)  Utilities: Patient Unable To Answer (09/27/2023)  Social Connections: Patient Unable To Answer (09/27/2023)  Tobacco Use: Unknown (10/01/2023)     Readmission Risk Interventions     No data to display

## 2023-10-03 NOTE — Discharge Summary (Signed)
 Physician Discharge Summary   Patient: Emily Blair MRN: [id number] DOB: 02/16/1946  Admit date:     09/25/2023  Discharge date: 10/03/23  Discharge Physician: Thad Ranger, MD    PCP: Maris Berger, MD   Recommendations at discharge:   Metoprolol was increased to 50 mg twice daily during hospitalization, can decrease to 25 mg twice daily if BP soft or any bradycardia. Fall precautions, aspiration precautions Discharge diet: Dysphagia 3 diet with thin liquids Patient has severe B12 deficiency, please continue B12 1000 mcg IM daily for 7 days, then taper to 1000 mcg IM weekly for 7 doses, then monthly.   Discharge Diagnoses:   Acute metabolic encephalopathy with left-sided weakness   Acute respiratory failure (HCC) due to aspiration pneumonia Severe B12 deficiency Hypokalemia Hypophosphatemia    Paroxysmal atrial fibrillation (HCC)   Essential hypertension   Hyperlipidemia   Normocytic anemia History of bipolar disorder Normocytic anemia GERD  Hospital Course:  Patient is a 78 year old female with a history of arthritis, bipolar disorder, HTN, osteoporosis sent from ALF due to acute left-sided weakness, aphasia and left hemineglect.  Facility noted that she had not been behaving normally at that time, had some confusion and then was found on the floor early morning with worsening confusion.  In ED, code stroke was called however CT perfusion and MRI were both negative.   Due to worsening mental status and concern for airway protection, she was electively intubated.  Workup was initiated, underwent continuous EEG and had no clinical seizures.  She was placed on low-dose Precedex in ICU. Transferred to [facility] on 09/29/2023, remains persistently confused.  Neurology was requested to reconsult again.  Assessment and Plan:  Acute encephalopathy, left-sided weakness/left hemineglect - Concern for seizure/prolonged postictal state -No prior history of seizures, workup negative with  imaging including MRI. -At baseline, she is fairly independent, has mild dementia, able to go to the bathroom on her own.  Has history of falls, left upper humerus fracture. -Underwent LTM EEG, antibiotics have been discontinued -Neurology following, recommended to continue B12 replacement, Tegretol  -Mental status continues to improve, continue Seroquel 12.5 mg daily at bedtime     Acute Respiratory Failure due to aspiration pneumonia -Extubated on 3/9, now on room air -Completed IV Unasyn -Continue nebs, aspiration precautions   Severe B12 deficiency -B12 50, placed on vitamin B12 replacement -continue B12 1000 mcg IM daily for 7 days, then taper to 1000 mcg IM weekly for 7 doses, then monthly. -Added folic acid 1 mg daily   Hypokalemia -Replace as needed   ? Paroxysmal Atrial Fibrillation: Per PCCM New onset  -2D echo showed EF of 70 to 75% with no regional WMA, normal left ventricular diastolic parameters  - currently in NSR.   -Anticoagulation deferred due to history of falls -Continue Lopressor 50 mg twice daily    Essential hypertension -BP better, continue Lopressor    Hypophosphatemia -Received K-Phos    HLD:  -Continue statin     Hx of Bipolar 1:  -Continue Tegretol    GERD/ -Continue Protonix   Normocytic Anemia:  -anemia panel showed iron 27, percent saturation ratio 9, ferritin 270, folate 7.6, B12 50 Hemoglobin 10.9 at discharge   Estimated body mass index is 21.36 kg/m as calculated from the following:   Height as of this encounter: 5' 7.99" (1.727 m).   Weight as of this encounter: 63.7 kg.     Pain control - [employer] Controlled Substance Reporting System database was reviewed. and patient  was instructed, not to drive, operate heavy machinery, perform activities at heights, swimming or participation in water activities or provide baby-sitting services while on Pain, Sleep and Anxiety Medications; until their outpatient Physician has advised  to do so again. Also recommended to not to take more than prescribed Pain, Sleep and Anxiety Medications.  Consultants: Neurology.  Patient was admitted by pulmonary critical care and 80 Procedures performed: EEG, intubation, extubation Disposition: Skilled nursing facility Diet recommendation: Dysphagia 3 diet with thin liquids  DISCHARGE MEDICATION: Allergies as of 10/03/2023   No Known Allergies      Medication List     STOP taking these medications    lisinopril 20 MG tablet Commonly known as: ZESTRIL   nitrofurantoin (macrocrystal-monohydrate) 100 MG capsule Commonly known as: MACROBID   oseltamivir 75 MG capsule Commonly known as: TAMIFLU   oxyCODONE 5 MG immediate release tablet Commonly known as: Oxy IR/ROXICODONE       TAKE these medications    Acetaminophen 8 Hour 650 MG CR tablet Generic drug: acetaminophen Take 650 mg by mouth every 6 (six) hours as needed for pain.   carbamazepine 400 MG 12 hr tablet Commonly known as: TEGRETOL XR Take 400 mg by mouth at bedtime.   cyanocobalamin 1000 MCG/ML injection Commonly known as: VITAMIN B12 Inject 1 mL (1,000 mcg total) into the muscle daily for 7 days, THEN 1 mL (1,000 mcg total) once a week for 7 days. Start taking on: October 04, 2023   folic acid 1 MG tablet Commonly known as: FOLVITE Take 1 tablet (1 mg total) by mouth daily. Start taking on: October 04, 2023   levalbuterol 0.63 MG/3ML nebulizer solution Commonly known as: XOPENEX Take 3 mLs (0.63 mg total) by nebulization every 6 (six) hours as needed for wheezing.   melatonin 3 MG Tabs tablet Take 1 tablet (3 mg total) by mouth at bedtime as needed. What changed:  when to take this reasons to take this   metoprolol tartrate 50 MG tablet Commonly known as: LOPRESSOR Take 1 tablet (50 mg total) by mouth 2 (two) times daily. What changed:  medication strength how much to take   pantoprazole 40 MG tablet Commonly known as: PROTONIX Take 1  tablet (40 mg total) by mouth 2 (two) times daily.   polyethylene glycol 17 g packet Commonly known as: MIRALAX / GLYCOLAX Take 17 g by mouth daily. Start taking on: October 04, 2023   QUEtiapine 25 MG tablet Commonly known as: SEROQUEL Take 0.5 tablets (12.5 mg total) by mouth at bedtime.   senna-docusate 8.6-50 MG tablet Commonly known as: Senokot-S Take 1 tablet by mouth 2 (two) times daily.   simvastatin 10 MG tablet Commonly known as: ZOCOR Take 10 mg by mouth at bedtime.        Follow-up Information     Sistasis, Rowena, MD. Schedule an appointment as soon as possible for a visit in 2 week(s).   Specialty: Family Medicine Why: for hospital follow-up Contact information: [address] [place] [address] [phone]                Discharge Exam: Ceasar Mons Weights   09/29/23 0500 10/02/23 0354 10/03/23 0500  Weight: 66 kg 63.7 kg 64 kg   S: Doing well, much more alert and oriented, mental status improving, eating  breakfast without any assistance this morning, no acute issues overnight.  BP 104/75 (BP Location: Left Arm)   Pulse 64   Temp 98.2 F (36.8 C) (Oral)  Resp 18   Ht 5' 7.99" (1.727 m)   Wt 64 kg   SpO2 98%   BMI 21.46 kg/m   Physical Exam General: Alert and oriented x 3, NAD Cardiovascular: S1 S2 clear, RRR.  Respiratory: CTAB, no wheezing Gastrointestinal: Soft, nontender, nondistended, NBS Ext: no pedal edema bilaterally Neuro: no new deficits Psych: Normal affect, pleasant   Condition at discharge: fair  The results of significant diagnostics from this hospitalization (including imaging, microbiology, ancillary and laboratory) are listed below for reference.   Imaging Studies: DG CHEST PORT 1 VIEW Result Date: 10/01/2023 CLINICAL DATA:  Fever EXAM: PORTABLE CHEST 1 VIEW COMPARISON:  09/30/2023. FINDINGS: The heart size and mediastinal contours are within normal limits. Tortuous and ectatic aorta. Mild basilar linear opacity  bilateral, likely scar or atelectasis. Chronic deformity left proximal humerus. Overlapping cardiac leads IMPRESSION: Basilar atelectasis.  Tortuous aorta. Electronically Signed   By: Karen Kays M.D.   On: 10/01/2023 20:23   DG CHEST PORT 1 VIEW Result Date: 09/30/2023 CLINICAL DATA:  Shortness of breath EXAM: PORTABLE CHEST 1 VIEW COMPARISON:  09/25/2023 FINDINGS: Endotracheal tube and gastric catheter have been removed in the interval. Lungs are well aerated bilaterally. Tortuous thoracic aorta is again seen. IMPRESSION: No active disease. Electronically Signed   By: Alcide Clever M.D.   On: 09/30/2023 10:41   ECHOCARDIOGRAM COMPLETE Result Date: 09/28/2023    ECHOCARDIOGRAM REPORT   Patient Name:   HAJAR PENNINGER Bessent Date of Exam: 09/28/2023 Medical Rec #:  [id number]  Height:       68.0 in Accession #:    [id number] Weight:       160.9 lb Date of Birth:  05-05-1946  BSA:          1.863 m Patient Age:    77 years   BP:           158/86 mmHg Patient Gender: F          HR:           86 bpm. Exam Location:  Inpatient Procedure: 2D Echo, Cardiac Doppler and Color Doppler (Both Spectral and Color            Flow Doppler were utilized during procedure). Indications:    Murmur R01.1  History:        Patient has prior history of Echocardiogram examinations, most                 recent 04/04/2023.  Sonographer:    Lucendia Herrlich RCS Referring Phys: [phone] PAULA B SIMPSON IMPRESSIONS  1. Left ventricular ejection fraction, by estimation, is 70 to 75%. The left ventricle has hyperdynamic function. The left ventricle has no regional wall motion abnormalities. Left ventricular diastolic parameters were normal.  2. Right ventricular systolic function is normal. The right ventricular size is normal.  3. The mitral valve is normal in structure. No evidence of mitral valve regurgitation. No evidence of mitral stenosis.  4. The aortic valve has an indeterminant number of cusps. Aortic valve regurgitation is not visualized. No aortic  stenosis is present.  5. Aortic dilatation noted. There is moderate dilatation of the ascending aorta, measuring 43 mm.  6. The inferior vena cava is normal in size with greater than 50% respiratory variability, suggesting right atrial pressure of 3 mmHg. FINDINGS  Left Ventricle: Left ventricular ejection fraction, by estimation, is 70 to 75%. The left ventricle has hyperdynamic function. The left ventricle has no regional wall motion abnormalities. The left ventricular  internal cavity size was normal in size. There is no left ventricular hypertrophy. Left ventricular diastolic parameters were normal. Right Ventricle: The right ventricular size is normal. No increase in right ventricular wall thickness. Right ventricular systolic function is normal. Left Atrium: Left atrial size was normal in size. Right Atrium: Right atrial size was normal in size. Pericardium: There is no evidence of pericardial effusion. Mitral Valve: The mitral valve is normal in structure. No evidence of mitral valve regurgitation. No evidence of mitral valve stenosis. Tricuspid Valve: The tricuspid valve is normal in structure. Tricuspid valve regurgitation is trivial. No evidence of tricuspid stenosis. Aortic Valve: The aortic valve has an indeterminant number of cusps. Aortic valve regurgitation is not visualized. No aortic stenosis is present. Aortic valve peak gradient measures 11.8 mmHg. Pulmonic Valve: The pulmonic valve was normal in structure. Pulmonic valve regurgitation is not visualized. No evidence of pulmonic stenosis. Aorta: Aortic dilatation noted. There is moderate dilatation of the ascending aorta, measuring 43 mm. Venous: The inferior vena cava is normal in size with greater than 50% respiratory variability, suggesting right atrial pressure of 3 mmHg. IAS/Shunts: No atrial level shunt detected by color flow Doppler.  LEFT VENTRICLE PLAX 2D LVIDd:         2.70 cm   Diastology LVIDs:         1.80 cm   LV e' medial:    5.33  cm/s LV PW:         0.80 cm   LV E/e' medial:  14.9 LV IVS:        1.00 cm   LV e' lateral:   7.29 cm/s LVOT diam:     2.00 cm   LV E/e' lateral: 10.9 LV SV:         57 LV SV Index:   30 LVOT Area:     3.14 cm  RIGHT VENTRICLE             IVC RV S prime:     20.70 cm/s  IVC diam: 2.00 cm TAPSE (M-mode): 1.6 cm LEFT ATRIUM             Index        RIGHT ATRIUM           Index LA diam:        3.00 cm 1.61 cm/m   RA Area:     14.30 cm LA Vol (A2C):   25.0 ml 13.42 ml/m  RA Volume:   34.90 ml  18.73 ml/m LA Vol (A4C):   21.0 ml 11.27 ml/m LA Biplane Vol: 27.1 ml 14.54 ml/m  AORTIC VALVE AV Area (Vmax): 1.73 cm AV Vmax:        172.00 cm/s AV Peak Grad:   11.8 mmHg LVOT Vmax:      94.90 cm/s LVOT Vmean:     62.100 cm/s LVOT VTI:       0.180 m  AORTA Ao Root diam: 3.60 cm Ao Asc diam:  4.30 cm MITRAL VALVE MV Area (PHT): 4.18 cm    SHUNTS MV Decel Time: 182 msec    Systemic VTI:  0.18 m MV E velocity: 79.35 cm/s  Systemic Diam: 2.00 cm MV A velocity: 93.40 cm/s MV E/A ratio:  0.85 Aditya Sabharwal Electronically signed by Dorthula Nettles Signature Date/Time: 09/28/2023/11:03:18 AM    Final    Overnight EEG with video Result Date: 09/26/2023 Charlsie Quest, MD     09/27/2023 11:08 AM Patient Name: XINYI BATTON MRN:  [id number] Epilepsy Attending: Charlsie Quest Referring Physician/Provider: Lynnell Catalan, MD Duration: 09/25/2023 1137 to 09/26/2023 1137  Patient history: 78 year old female presented with acute onset of left sided weakness, aphasia and left hemineglect. EEG to evaluate for seizure  Level of alertness:  comatose/ lethargic  AEDs during EEG study: CBZ, Propofol  Technical aspects: This EEG study was done with scalp electrodes positioned according to the 10-20 International system of electrode placement. Electrical activity was reviewed with band pass filter of 1-70Hz , sensitivity of 7 uV/mm, display speed of 13mm/sec with a 60Hz  notched filter applied as appropriate. EEG data were recorded continuously  and digitally stored.  Video monitoring was available and reviewed as appropriate.  Description: EEG showed continuous generalized and lateralized right hemisphere 3 to 6 Hz theta-delta slowing. Sharply contoured waves were noted in left posterior quadrant. Hyperventilation and photic stimulation were not performed.    ABNORMALITY -Continuous slow generalized and lateralized right hemisphere  IMPRESSION: This study is suggestive of cortical dysfunction in right hemisphere likely secondary to underlying structural abnormality, postictal state.  Additionally there is moderate to severe diffuse encephalopathy.  No seizures or definite epileptiform discharges were seen throughout the recording.  Charlsie Quest   DG Chest Portable 1 View Result Date: 09/25/2023 CLINICAL DATA:  Intubation EXAM: PORTABLE CHEST 1 VIEW COMPARISON:  04/03/2023 x-ray FINDINGS: No consolidation, pneumothorax or effusion. No edema. Normal cardiopericardial silhouette. Tortuous ectatic aorta. ET tube in place with tip seen 2.4 cm above the carina. Enteric tube in place coiling overlying the left upper quadrant with tip overlying the fundus of the stomach. Minimal left basilar scar atelectasis. Fracture deformity of the left proximal humerus. Please correlate with x-rays of 05/10/2023. IMPRESSION: New ET tube and enteric tube. Left basilar atelectasis. Known fracture of the left proximal humerus. Electronically Signed   By: Karen Kays M.D.   On: 09/25/2023 10:21   EEG adult Result Date: 09/25/2023 Charlsie Quest, MD     09/25/2023  9:58 AM Patient Name: MARGUARITE MARKOV MRN: [id number] Epilepsy Attending: Charlsie Quest Referring Physician/Provider: Caryl Pina, MD Date: 09/25/2023 Duration: 26.36 mins Patient history: 78 year old female presented with acute onset of left sided weakness, aphasia and left hemineglect. EEG to evaluate for seizure Level of alertness:  comatose/ lethargic AEDs during EEG study: Ativan Technical aspects: This EEG  study was done with scalp electrodes positioned according to the 10-20 International system of electrode placement. Electrical activity was reviewed with band pass filter of 1-70Hz , sensitivity of 7 uV/mm, display speed of 25mm/sec with a 60Hz  notched filter applied as appropriate. EEG data were recorded continuously and digitally stored.  Video monitoring was available and reviewed as appropriate. Description: EEG showed continuous generalized and lateralized right hemisphere 3 to 6 Hz theta-delta slowing.  Hyperventilation and photic stimulation were not performed.   ABNORMALITY -Continuous slow generalized and lateralized right hemisphere IMPRESSION: This study is suggestive of cortical dysfunction in right hemisphere likely secondary to underlying structural abnormality, postictal state.  Additionally there is moderate to severe diffuse encephalopathy.  No seizures or definite epileptiform discharges were seen throughout the recording. Charlsie Quest   MR BRAIN W WO CONTRAST Result Date: 09/25/2023 CLINICAL DATA:  78 year old female code stroke presentation. Left side weakness reported. EXAM: MRI HEAD WITHOUT AND WITH CONTRAST TECHNIQUE: Multiplanar, multiecho pulse sequences of the brain and surrounding structures were obtained without and with intravenous contrast. CONTRAST:  7mL GADAVIST GADOBUTROL 1 MMOL/ML IV SOLN COMPARISON:  CT head, CTA,  CTP this morning. Previous brain MRI 02/27/2023. FINDINGS: Brain: DWI is mildly motion degraded. No convincing diffusion restriction, acute infarct. Choroid plexus cysts, normal variant and stable. Stable cerebral volume. Nomidline shift, mass effect, ventriculomegaly, or acute intracranial hemorrhage. Cervicomedullary junction and pituitary are within normal limits. Left quadrigeminal plate extra-axial up to 1.2 cm lesion with mixed signal again noted, fluid fluid level. This is stable, benign, and without regional brain edema. No enhancement following contrast.  Stable gray and white matter signal throughout the brain. Patchy mild to moderate for age cerebral white matter T2 and FLAIR hyperintensity. Mild chronic T2 heterogeneity in the deep gray matter nuclei, pons. No cortical encephalomalacia. However, there is possibly a chronic microhemorrhage in the right pons on SWI (series 7, image 30), not apparent on T2* previously. No abnormal enhancement identified. No dural thickening. Vascular: Major intracranial vascular flow voids are preserved. Following contrast major dural venous sinuses are enhancing and appear to be patent. Skull and upper cervical spine: Stable. Congenital incomplete segmentation of C2-C3. Visualized bone marrow signal is within normal limits. Sinuses/Orbits: Stable. Other: Mild left mastoid effusion is stable. Negative nasopharynx aside from trace retained secretions. Other visible internal auditory structures appear grossly normal. IMPRESSION: 1. No acute intracranial abnormality. 2. Mild to moderate for age signal changes of chronic small vessel disease. 3. 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 Electronically Signed   By: Odessa Fleming M.D.   On: 09/25/2023 06:17   CT ANGIO HEAD NECK W WO CM W PERF (CODE STROKE) Result Date: 09/25/2023 CLINICAL DATA:  78 year old female code stroke presentation. Left side weakness. EXAM: CT ANGIOGRAPHY HEAD AND NECK CT PERFUSION BRAIN TECHNIQUE: Multidetector CT imaging of the head and neck was performed using the standard protocol during bolus administration of intravenous contrast. Multiplanar CT image reconstructions and MIPs were obtained to evaluate the vascular anatomy. Carotid stenosis measurements (when applicable) are obtained utilizing NASCET criteria, using the distal internal carotid diameter as the denominator. Multiphase CT imaging of the brain was performed following IV bolus contrast injection. Subsequent parametric perfusion maps  were calculated using RAPID software. RADIATION DOSE REDUCTION: This exam was performed according to the departmental dose-optimization program which includes automated exposure control, adjustment of the mA and/or kV according to patient size and/or use of iterative reconstruction technique. CONTRAST:  OMNIPAQUE IOHEXOL 350 MG/ML SOLN COMPARISON:  Plain head CT 0344 hours today. FINDINGS: CT Brain Perfusion Findings: ASPECTS: 10 Motion degraded. CBF (<30%) Volume: 0mL Perfusion (Tmax>6.0s) volume: 0mL Mismatch Volume: Not applicable Infarction Location:Not applicable CTA NECK Skeleton: Cervical spine degeneration superimposed on congenital appearing incomplete segmentation of C2-C3. Spondylolisthesis, scoliosis. No acute osseous abnormality identified. Upper chest: Negative. Other neck: Neck soft tissue spaces appear within normal limits. Aortic arch: Tortuous 3 vessel arch configuration. Minimal arch atherosclerosis. Right carotid system: Tortuous brachiocephalic artery without plaque or stenosis. Negative right CCA. Minimal plaque at the right carotid bifurcation. Mild right ICA tortuosity without stenosis. Left carotid system: Similar mild plaque and tortuosity with no significant stenosis. Vertebral arteries: Tortuous proximal right subclavian artery with normal right vertebral artery origin. Tortuous right V1 segment. Mildly dominant right vertebral artery with tortuosity is patent to the skull base with no significant plaque or stenosis. Mildly tortuous proximal left subclavian artery with no plaque. Normal left vertebral artery origin. Mildly non dominant but normal size left vertebral artery is patent to the skull base with no significant plaque or stenosis. CTA HEAD Posterior circulation: Patent, mildly  tortuous distal vertebral arteries and vertebrobasilar junction. Mild right V4 calcified plaque without stenosis. Patent PICA origins. Patent basilar artery without stenosis. Patent SCA and PCA  origins. Posterior communicating arteries are diminutive or absent. Mildly ectatic basilar artery. Bilateral PCA branches are patent with mild irregularity. Anterior circulation: Both ICA siphons are patent. Both siphons are tortuous. Mild calcified plaque affecting the cavernous segments and anterior genu bilaterally. No siphon stenosis. Patent carotid termini. Normal MCA and ACA origins. Normal anterior communicating artery. Moderate stenosis left ACA A2 segment (series 12, image 38). Other bilateral ACA branches are within normal limits. Left MCA M1 segment bifurcates early without stenosis. Right MCA M1 segment is tortuous and bifurcates without stenosis. Bilateral MCA branches are within normal limits. Venous sinuses: Early contrast timing, grossly patent. Anatomic variants: Mildly dominant right vertebral artery. Review of the MIP images confirms the above findings IMPRESSION: 1. CTA is negative for large vessel occlusion. Negative motion degraded CT Perfusion. 2. Generalized vessel tortuosity with mild for age atherosclerosis in the head and neck. Although Moderate stenosis of the Left ACA A2 segment. No other significant arterial stenosis. Study discussed by telephone with Dr. Caryl Pina on 09/25/2023 at 04:12 . Electronically Signed   By: Odessa Fleming M.D.   On: 09/25/2023 04:12   CT HEAD CODE STROKE WO CONTRAST Result Date: 09/25/2023 CLINICAL DATA:  Code stroke.  Neuro deficit, acute, stroke suspected EXAM: CT HEAD WITHOUT CONTRAST TECHNIQUE: Contiguous axial images were obtained from the base of the skull through the vertex without intravenous contrast. RADIATION DOSE REDUCTION: This exam was performed according to the departmental dose-optimization program which includes automated exposure control, adjustment of the mA and/or kV according to patient size and/or use of iterative reconstruction technique. COMPARISON:  CT head May 10, 2023. FINDINGS: Motion limited study.  Within this limitation: Brain:  No evidence of acute large vascular territory infarction, hemorrhage, hydrocephalus, extra-axial collection or midline shift. 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 Vascular: Calcific atherosclerosis. No hyperdense vessel identified. Skull: No acute fracture. Sinuses/Orbits: Clear sinuses.  No acute orbital findings. Other: No mastoid effusions. ASPECTS [facility] Stroke Program Early CT Score)Total score (0-10 with 10 being normal): 10. IMPRESSION: 1. No evidence of acute intracranial abnormality on this motion limited study. ASPECTS is 10. 2. 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 Code stroke imaging results were communicated on 09/25/2023 at 3:52 am to provider Dr. Otelia Limes via secure text paging. Electronically Signed   By: Feliberto Harts M.D.   On: 09/25/2023 03:53    Microbiology: Results for orders placed or performed during the hospital encounter of 09/25/23  Culture, blood (routine x 2)     Status: None   Collection Time: 09/25/23  7:13 AM   Specimen: BLOOD  Result Value Ref Range Status   Specimen Description BLOOD SITE NOT SPECIFIED  Final   Special Requests   Final    BOTTLES DRAWN AEROBIC AND ANAEROBIC Blood Culture results may not be optimal due to an inadequate volume of blood received in culture bottles   Culture   Final    NO GROWTH 5 DAYS Performed at [facility], [address], [place], [place] [address]    Report Status 09/30/2023 FINAL  Final  Culture, blood (routine x 2)  Status: None   Collection Time: 09/25/23  7:18 AM   Specimen: BLOOD  Result Value Ref Range Status   Specimen Description BLOOD SITE NOT SPECIFIED  Final   Special Requests   Final    BOTTLES DRAWN AEROBIC AND ANAEROBIC Blood Culture results may not be optimal  due to an inadequate volume of blood received in culture bottles   Culture   Final    NO GROWTH 5 DAYS Performed at [facility], [address] [place], [place] [address]    Report Status 09/30/2023 FINAL  Final  CSF culture w Gram Stain     Status: None   Collection Time: 09/25/23  9:41 AM   Specimen: CSF; Cerebrospinal Fluid  Result Value Ref Range Status   Specimen Description CSF  Final   Special Requests NONE  Final   Gram Stain NO WBC SEEN NO ORGANISMS SEEN CYTOSPIN SMEAR   Final   Culture   Final    NO GROWTH 3 DAYS Performed at [facility], [address] [place], [place] [address]    Report Status 09/28/2023 FINAL  Final  MRSA Next Gen by PCR, Nasal     Status: None   Collection Time: 09/25/23 10:43 AM   Specimen: Nasal Mucosa; Nasal Swab  Result Value Ref Range Status   MRSA by PCR Next Gen NOT DETECTED NOT DETECTED Final    Comment: (NOTE) The GeneXpert MRSA Assay (FDA approved for NASAL specimens only), is one component of a comprehensive MRSA colonization surveillance program. It is not intended to diagnose MRSA infection nor to guide or monitor treatment for MRSA infections. Test performance is not FDA approved in patients less than 85 years old. Performed at [facility], [address] [place], [place] [address]   Respiratory (~20 pathogens) panel by PCR     Status: None   Collection Time: 09/27/23  9:14 AM   Specimen: Nasopharyngeal Swab; Respiratory  Result Value Ref Range Status   Adenovirus NOT DETECTED NOT DETECTED Final   Coronavirus 229E NOT DETECTED NOT DETECTED Final    Comment: (NOTE) The Coronavirus on the Respiratory Panel, DOES NOT test for the novel  Coronavirus (2019 nCoV)    Coronavirus HKU1 NOT DETECTED NOT DETECTED Final   Coronavirus NL63 NOT DETECTED NOT DETECTED Final   Coronavirus OC43 NOT DETECTED NOT DETECTED Final   Metapneumovirus NOT DETECTED NOT DETECTED Final   Rhinovirus / Enterovirus NOT DETECTED  NOT DETECTED Final   Influenza A NOT DETECTED NOT DETECTED Final   Influenza B NOT DETECTED NOT DETECTED Final   Parainfluenza Virus 1 NOT DETECTED NOT DETECTED Final   Parainfluenza Virus 2 NOT DETECTED NOT DETECTED Final   Parainfluenza Virus 3 NOT DETECTED NOT DETECTED Final   Parainfluenza Virus 4 NOT DETECTED NOT DETECTED Final   Respiratory Syncytial Virus NOT DETECTED NOT DETECTED Final   Bordetella pertussis NOT DETECTED NOT DETECTED Final   Bordetella Parapertussis NOT DETECTED NOT DETECTED Final   Chlamydophila pneumoniae NOT DETECTED NOT DETECTED Final   Mycoplasma pneumoniae NOT DETECTED NOT DETECTED Final    Comment: Performed at [facility], [address], [place], [place] [address]  Culture, blood (Routine X 2) w Reflex to ID Panel     Status: None (Preliminary result)   Collection Time: 10/01/23  6:23 PM   Specimen: BLOOD  Result Value Ref Range Status   Specimen Description BLOOD SITE NOT SPECIFIED  Final   Special Requests  Final    BOTTLES DRAWN AEROBIC AND ANAEROBIC Blood Culture adequate volume   Culture   Final    NO GROWTH 2 DAYS Performed at [facility], [address], [place], [place] [address]    Report Status PENDING  Incomplete  Culture, blood (Routine X 2) w Reflex to ID Panel     Status: None (Preliminary result)   Collection Time: 10/01/23  6:23 PM   Specimen: BLOOD  Result Value Ref Range Status   Specimen Description BLOOD SITE NOT SPECIFIED  Final   Special Requests   Final    BOTTLES DRAWN AEROBIC AND ANAEROBIC Blood Culture results may not be optimal due to an inadequate volume of blood received in culture bottles   Culture   Final    NO GROWTH 2 DAYS Performed at [facility], [address], [place], [place] [address]    Report Status PENDING  Incomplete    Labs: CBC: Recent Labs  Lab 09/29/23 0524 09/30/23 0550 10/01/23 1823 10/02/23 0716 10/03/23 0558  WBC 6.6 8.1 12.6* 7.2 6.6  NEUTROABS  --  5.9  --   --    --   HGB 11.2* 12.4 8.7* 11.1* 10.9*  HCT 33.0* 35.9* 25.6* 32.5* 32.1*  MCV 96.8 95.7 97.3 96.7 96.1  PLT 289 332 351 331 282   Basic Metabolic Panel: Recent Labs  Lab 09/27/23 0345 09/28/23 0300 09/29/23 0524 09/29/23 1011 09/30/23 0550 10/01/23 0703 10/02/23 0717 10/03/23 0559  NA 135 136 131*  --  133* 131* 130* 131*  K 3.0* 3.6 3.0*  --  2.9* 4.2 4.2 4.7  CL 102 104 93*  --  93* 94* 93* 96*  CO2 21* 23 27  --  30 26 29 23   GLUCOSE 153* 122* 133*  --  139* 154* 136* 124*  BUN 19 12 7*  --  10 7* 18 21  CREATININE 0.71 0.49 0.58  --  0.62 0.47 0.76 0.70  CALCIUM 9.9 9.7 10.2  --  10.5* 10.0 10.4* 10.3  MG 1.8 2.0  --  1.8 2.3  --  2.1  --   PHOS 2.4* 2.8  --  2.4* 2.9  --  3.1 2.9   Liver Function Tests: Recent Labs  Lab 09/27/23 0345 09/29/23 1011 09/30/23 0550 10/02/23 0717 10/03/23 0559  AST 18 21 21   --   --   ALT 8 13 16   --   --   ALKPHOS 127* 122 138*  --   --   BILITOT 0.8 0.9 1.0  --   --   PROT 5.8* 6.6 7.6  --   --   ALBUMIN 2.9* 3.0* 3.5 2.8* 2.8*   CBG: Recent Labs  Lab 10/02/23 0645 10/02/23 1242 10/02/23 1519 10/02/23 2107 10/03/23 0620  GLUCAP 150* 124* 163* 157* 124*    Discharge time spent: greater than 30 minutes.  Signed: Thad Ranger, MD [facility] 10/03/2023

## 2023-10-06 LAB — CULTURE, BLOOD (ROUTINE X 2)
Culture: NO GROWTH
Culture: NO GROWTH
Special Requests: ADEQUATE

## 2023-10-11 ENCOUNTER — Other Ambulatory Visit: Payer: Self-pay

## 2023-10-11 ENCOUNTER — Encounter (HOSPITAL_COMMUNITY): Payer: Self-pay | Admitting: Emergency Medicine

## 2023-10-11 ENCOUNTER — Inpatient Hospital Stay (HOSPITAL_COMMUNITY): Admitting: Anesthesiology

## 2023-10-11 ENCOUNTER — Emergency Department (HOSPITAL_COMMUNITY)

## 2023-10-11 ENCOUNTER — Encounter (HOSPITAL_COMMUNITY)
Admission: EM | Disposition: A | Discharge status: Skilled Nursing Facility | Payer: Self-pay | Source: Skilled Nursing Facility | Attending: Internal Medicine

## 2023-10-11 ENCOUNTER — Inpatient Hospital Stay (HOSPITAL_COMMUNITY)
Admission: EM | Admit: 2023-10-11 | Discharge: 2023-10-15 | DRG: 481 | Disposition: A | Discharge status: Skilled Nursing Facility | Source: Skilled Nursing Facility | Attending: Internal Medicine | Admitting: Internal Medicine

## 2023-10-11 ENCOUNTER — Inpatient Hospital Stay (HOSPITAL_COMMUNITY)

## 2023-10-11 DIAGNOSIS — E538 Deficiency of other specified B group vitamins: Secondary | ICD-10-CM | POA: Diagnosis present

## 2023-10-11 DIAGNOSIS — M810 Age-related osteoporosis without current pathological fracture: Secondary | ICD-10-CM | POA: Diagnosis present

## 2023-10-11 DIAGNOSIS — E440 Moderate protein-calorie malnutrition: Secondary | ICD-10-CM | POA: Insufficient documentation

## 2023-10-11 DIAGNOSIS — R296 Repeated falls: Secondary | ICD-10-CM | POA: Diagnosis present

## 2023-10-11 DIAGNOSIS — G40909 Epilepsy, unspecified, not intractable, without status epilepticus: Secondary | ICD-10-CM

## 2023-10-11 DIAGNOSIS — E861 Hypovolemia: Secondary | ICD-10-CM | POA: Diagnosis present

## 2023-10-11 DIAGNOSIS — D62 Acute posthemorrhagic anemia: Secondary | ICD-10-CM | POA: Diagnosis not present

## 2023-10-11 DIAGNOSIS — Z6822 Body mass index (BMI) 22.0-22.9, adult: Secondary | ICD-10-CM

## 2023-10-11 DIAGNOSIS — E871 Hypo-osmolality and hyponatremia: Secondary | ICD-10-CM | POA: Diagnosis not present

## 2023-10-11 DIAGNOSIS — Z79899 Other long term (current) drug therapy: Secondary | ICD-10-CM | POA: Diagnosis not present

## 2023-10-11 DIAGNOSIS — Z751 Person awaiting admission to adequate facility elsewhere: Secondary | ICD-10-CM

## 2023-10-11 DIAGNOSIS — I480 Paroxysmal atrial fibrillation: Secondary | ICD-10-CM | POA: Diagnosis present

## 2023-10-11 DIAGNOSIS — E785 Hyperlipidemia, unspecified: Secondary | ICD-10-CM | POA: Diagnosis present

## 2023-10-11 DIAGNOSIS — S72142A Displaced intertrochanteric fracture of left femur, initial encounter for closed fracture: Secondary | ICD-10-CM

## 2023-10-11 DIAGNOSIS — F319 Bipolar disorder, unspecified: Secondary | ICD-10-CM

## 2023-10-11 DIAGNOSIS — I10 Essential (primary) hypertension: Secondary | ICD-10-CM | POA: Diagnosis present

## 2023-10-11 DIAGNOSIS — Y92129 Unspecified place in nursing home as the place of occurrence of the external cause: Secondary | ICD-10-CM | POA: Diagnosis not present

## 2023-10-11 DIAGNOSIS — D649 Anemia, unspecified: Secondary | ICD-10-CM | POA: Diagnosis present

## 2023-10-11 DIAGNOSIS — E876 Hypokalemia: Secondary | ICD-10-CM | POA: Diagnosis present

## 2023-10-11 DIAGNOSIS — K219 Gastro-esophageal reflux disease without esophagitis: Secondary | ICD-10-CM

## 2023-10-11 DIAGNOSIS — W010XXA Fall on same level from slipping, tripping and stumbling without subsequent striking against object, initial encounter: Secondary | ICD-10-CM | POA: Diagnosis present

## 2023-10-11 HISTORY — PX: INTRAMEDULLARY (IM) NAIL INTERTROCHANTERIC: SHX5875

## 2023-10-11 LAB — CBC
HCT: 31.6 % — ABNORMAL LOW (ref 36.0–46.0)
Hemoglobin: 10.9 g/dL — ABNORMAL LOW (ref 12.0–15.0)
MCH: 32.8 pg (ref 26.0–34.0)
MCHC: 34.5 g/dL (ref 30.0–36.0)
MCV: 95.2 fL (ref 80.0–100.0)
Platelets: 342 10*3/uL (ref 150–400)
RBC: 3.32 MIL/uL — ABNORMAL LOW (ref 3.87–5.11)
RDW: 12.2 % (ref 11.5–15.5)
WBC: 5.2 10*3/uL (ref 4.0–10.5)
nRBC: 0 % (ref 0.0–0.2)

## 2023-10-11 LAB — URINALYSIS, ROUTINE W REFLEX MICROSCOPIC
Bilirubin Urine: NEGATIVE
Glucose, UA: NEGATIVE mg/dL
Hgb urine dipstick: NEGATIVE
Ketones, ur: NEGATIVE mg/dL
Nitrite: NEGATIVE
Protein, ur: NEGATIVE mg/dL
Specific Gravity, Urine: 1.015 (ref 1.005–1.030)
pH: 6 (ref 5.0–8.0)

## 2023-10-11 LAB — BASIC METABOLIC PANEL
Anion gap: 8 (ref 5–15)
BUN: 5 mg/dL — ABNORMAL LOW (ref 8–23)
CO2: 28 mmol/L (ref 22–32)
Calcium: 9.7 mg/dL (ref 8.9–10.3)
Chloride: 96 mmol/L — ABNORMAL LOW (ref 98–111)
Creatinine, Ser: 0.55 mg/dL (ref 0.44–1.00)
GFR, Estimated: >60 mL/min (ref >60)
Glucose, Bld: 148 mg/dL — ABNORMAL HIGH (ref 70–99)
Potassium: 3.1 mmol/L — ABNORMAL LOW (ref 3.5–5.1)
Sodium: 132 mmol/L — ABNORMAL LOW (ref 135–145)

## 2023-10-11 LAB — MAGNESIUM: Magnesium: 1.6 mg/dL — ABNORMAL LOW (ref 1.7–2.4)

## 2023-10-11 LAB — URINALYSIS, MICROSCOPIC (REFLEX)

## 2023-10-11 LAB — VITAMIN D 25 HYDROXY (VIT D DEFICIENCY, FRACTURES): Vit D, 25-Hydroxy: 32.31 ng/mL (ref 30–100)

## 2023-10-11 SURGERY — FIXATION, FRACTURE, INTERTROCHANTERIC, WITH INTRAMEDULLARY ROD
Anesthesia: General | Laterality: Left

## 2023-10-11 MED ORDER — SUGAMMADEX SODIUM 200 MG/2ML IV SOLN: INTRAVENOUS | Status: DC | PRN

## 2023-10-11 MED ORDER — ONDANSETRON HCL 4 MG/2ML IJ SOLN: 4.0000 mg | Freq: Four times a day (QID) | INTRAMUSCULAR | Status: DC | PRN

## 2023-10-11 MED ORDER — PROPOFOL 10 MG/ML IV BOLUS
INTRAVENOUS | Status: AC
  Filled 2023-10-11: qty 20

## 2023-10-11 MED ORDER — LIDOCAINE 2% (20 MG/ML) 5 ML SYRINGE
INTRAMUSCULAR | Status: DC | PRN
  Administered 2023-10-11: 40 mg via INTRAVENOUS

## 2023-10-11 MED ORDER — PHENOL 1.4 % MT LIQD: 1.0000 | OROMUCOSAL | Status: DC | PRN

## 2023-10-11 MED ORDER — ORAL CARE MOUTH RINSE: 15.0000 mL | Freq: Once | OROMUCOSAL | Status: AC

## 2023-10-11 MED ORDER — ONDANSETRON HCL 4 MG PO TABS
4.0000 mg | ORAL_TABLET | Freq: Four times a day (QID) | ORAL | Status: DC | PRN
Start: 2023-10-11 — End: 2023-10-15

## 2023-10-11 MED ORDER — MAGNESIUM CITRATE PO SOLN: 1.0000 | Freq: Once | ORAL | Status: DC | PRN

## 2023-10-11 MED ORDER — SIMVASTATIN 20 MG PO TABS
10.0000 mg | ORAL_TABLET | Freq: Every day | ORAL | Status: DC
  Administered 2023-10-11 – 2023-10-14 (×4): 10 mg via ORAL
  Filled 2023-10-11 (×4): qty 1

## 2023-10-11 MED ORDER — ACETAMINOPHEN 325 MG PO TABS
325.0000 mg | ORAL_TABLET | Freq: Four times a day (QID) | ORAL | Status: DC | PRN
  Administered 2023-10-13: 650 mg via ORAL
  Filled 2023-10-11: qty 2

## 2023-10-11 MED ORDER — QUETIAPINE FUMARATE 25 MG PO TABS
12.5000 mg | ORAL_TABLET | Freq: Every day | ORAL | Status: DC
  Administered 2023-10-12 – 2023-10-14 (×3): 12.5 mg via ORAL
  Filled 2023-10-11 (×3): qty 1

## 2023-10-11 MED ORDER — DOCUSATE SODIUM 100 MG PO CAPS
100.0000 mg | ORAL_CAPSULE | Freq: Two times a day (BID) | ORAL | Status: DC
  Administered 2023-10-11 – 2023-10-15 (×9): 100 mg via ORAL
  Filled 2023-10-11 (×9): qty 1

## 2023-10-11 MED ORDER — FENTANYL CITRATE (PF) 250 MCG/5ML IJ SOLN
INTRAMUSCULAR | Status: AC
  Filled 2023-10-11: qty 5

## 2023-10-11 MED ORDER — CHLORHEXIDINE GLUCONATE 0.12 % MT SOLN: 15.0000 mL | Freq: Once | OROMUCOSAL | Status: AC

## 2023-10-11 MED ORDER — VASOPRESSIN 20 UNIT/ML IV SOLN
INTRAVENOUS | Status: AC
  Filled 2023-10-11: qty 1

## 2023-10-11 MED ORDER — SENNOSIDES-DOCUSATE SODIUM 8.6-50 MG PO TABS: 1.0000 | ORAL_TABLET | Freq: Every evening | ORAL | Status: DC | PRN

## 2023-10-11 MED ORDER — CEFAZOLIN SODIUM-DEXTROSE 2-4 GM/100ML-% IV SOLN
INTRAVENOUS | Status: AC
  Filled 2023-10-11: qty 100

## 2023-10-11 MED ORDER — METHOCARBAMOL 1000 MG/10ML IJ SOLN: 500.0000 mg | Freq: Four times a day (QID) | INTRAMUSCULAR | Status: DC | PRN

## 2023-10-11 MED ORDER — FOLIC ACID 1 MG PO TABS
1.0000 mg | ORAL_TABLET | Freq: Every day | ORAL | Status: DC
  Administered 2023-10-11 – 2023-10-15 (×5): 1 mg via ORAL
  Filled 2023-10-11 (×5): qty 1

## 2023-10-11 MED ORDER — TRANEXAMIC ACID-NACL 1000-0.7 MG/100ML-% IV SOLN
1000.0000 mg | Freq: Once | INTRAVENOUS | Status: AC
  Administered 2023-10-11: 1000 mg via INTRAVENOUS
  Filled 2023-10-11: qty 100

## 2023-10-11 MED ORDER — DEXAMETHASONE SODIUM PHOSPHATE 10 MG/ML IJ SOLN
INTRAMUSCULAR | Status: DC | PRN
  Administered 2023-10-11: 4 mg via INTRAVENOUS

## 2023-10-11 MED ORDER — DEXAMETHASONE SODIUM PHOSPHATE 10 MG/ML IJ SOLN
INTRAMUSCULAR | Status: AC
  Filled 2023-10-11: qty 2

## 2023-10-11 MED ORDER — ALUM & MAG HYDROXIDE-SIMETH 200-200-20 MG/5ML PO SUSP: 30.0000 mL | ORAL | Status: DC | PRN

## 2023-10-11 MED ORDER — OXYCODONE HCL 5 MG PO TABS
10.0000 mg | ORAL_TABLET | ORAL | Status: DC | PRN
  Administered 2023-10-11 – 2023-10-12 (×2): 10 mg via ORAL
  Administered 2023-10-12: 15 mg via ORAL
  Administered 2023-10-13: 10 mg via ORAL
  Administered 2023-10-14: 15 mg via ORAL
  Filled 2023-10-11 (×2): qty 3
  Filled 2023-10-11: qty 2

## 2023-10-11 MED ORDER — PANTOPRAZOLE SODIUM 40 MG PO TBEC
40.0000 mg | DELAYED_RELEASE_TABLET | Freq: Two times a day (BID) | ORAL | Status: DC
  Administered 2023-10-11 – 2023-10-15 (×8): 40 mg via ORAL
  Filled 2023-10-11 (×8): qty 1

## 2023-10-11 MED ORDER — ONDANSETRON HCL 4 MG/2ML IJ SOLN
INTRAMUSCULAR | Status: AC
  Filled 2023-10-11: qty 4

## 2023-10-11 MED ORDER — ACETAMINOPHEN 10 MG/ML IV SOLN
1000.0000 mg | Freq: Once | INTRAVENOUS | Status: AC
  Administered 2023-10-11: 1000 mg via INTRAVENOUS

## 2023-10-11 MED ORDER — ROCURONIUM BROMIDE 10 MG/ML (PF) SYRINGE
PREFILLED_SYRINGE | INTRAVENOUS | Status: AC
  Filled 2023-10-11: qty 10

## 2023-10-11 MED ORDER — METOPROLOL TARTRATE 50 MG PO TABS
50.0000 mg | ORAL_TABLET | Freq: Two times a day (BID) | ORAL | Status: DC
  Administered 2023-10-11 – 2023-10-15 (×8): 50 mg via ORAL
  Filled 2023-10-11 (×8): qty 1

## 2023-10-11 MED ORDER — CEFAZOLIN SODIUM-DEXTROSE 2-4 GM/100ML-% IV SOLN
2.0000 g | Freq: Once | INTRAVENOUS | Status: AC
  Administered 2023-10-11: 2 g via INTRAVENOUS

## 2023-10-11 MED ORDER — PHENYLEPHRINE 80 MCG/ML (10ML) SYRINGE FOR IV PUSH (FOR BLOOD PRESSURE SUPPORT)
PREFILLED_SYRINGE | INTRAVENOUS | Status: DC | PRN
  Administered 2023-10-11: 80 ug via INTRAVENOUS
  Administered 2023-10-11: 160 ug via INTRAVENOUS
  Administered 2023-10-11: 80 ug via INTRAVENOUS
  Administered 2023-10-11: 200 ug via INTRAVENOUS
  Administered 2023-10-11: 80 ug via INTRAVENOUS

## 2023-10-11 MED ORDER — LISINOPRIL 20 MG PO TABS
20.0000 mg | ORAL_TABLET | Freq: Every day | ORAL | Status: DC
Start: 2023-10-11 — End: 2023-10-15
  Administered 2023-10-11 – 2023-10-15 (×5): 20 mg via ORAL
  Filled 2023-10-11 (×5): qty 1

## 2023-10-11 MED ORDER — PROPOFOL 10 MG/ML IV BOLUS
INTRAVENOUS | Status: DC | PRN
  Administered 2023-10-11: 130 mg via INTRAVENOUS

## 2023-10-11 MED ORDER — ACETAMINOPHEN 10 MG/ML IV SOLN
INTRAVENOUS | Status: AC
  Filled 2023-10-11: qty 100

## 2023-10-11 MED ORDER — CYANOCOBALAMIN 1000 MCG/ML IJ SOLN: 1000.0000 ug | INTRAMUSCULAR | Status: DC

## 2023-10-11 MED ORDER — SORBITOL 70 % SOLN: 30.0000 mL | Freq: Every day | Status: DC | PRN

## 2023-10-11 MED ORDER — POTASSIUM CHLORIDE CRYS ER 20 MEQ PO TBCR
40.0000 meq | EXTENDED_RELEASE_TABLET | Freq: Once | ORAL | Status: AC
  Administered 2023-10-11: 40 meq via ORAL
  Filled 2023-10-11 (×2): qty 2

## 2023-10-11 MED ORDER — CARBAMAZEPINE ER 200 MG PO TB12
400.0000 mg | ORAL_TABLET | Freq: Every day | ORAL | Status: DC
  Administered 2023-10-11 – 2023-10-14 (×4): 400 mg via ORAL
  Filled 2023-10-11 (×5): qty 2

## 2023-10-11 MED ORDER — POLYETHYLENE GLYCOL 3350 17 G PO PACK
17.0000 g | PACK | Freq: Every day | ORAL | Status: DC | PRN
  Filled 2023-10-11: qty 1

## 2023-10-11 MED ORDER — CHLORHEXIDINE GLUCONATE 0.12 % MT SOLN
OROMUCOSAL | Status: AC
  Administered 2023-10-11: 15 mL via OROMUCOSAL
  Filled 2023-10-11: qty 15

## 2023-10-11 MED ORDER — MORPHINE SULFATE (PF) 2 MG/ML IV SOLN
2.0000 mg | INTRAVENOUS | Status: DC | PRN
  Administered 2023-10-11: 2 mg via INTRAVENOUS
  Filled 2023-10-11: qty 1

## 2023-10-11 MED ORDER — SUGAMMADEX SODIUM 200 MG/2ML IV SOLN
INTRAVENOUS | Status: DC | PRN
  Administered 2023-10-11: 256 mg via INTRAVENOUS

## 2023-10-11 MED ORDER — PHENYLEPHRINE 80 MCG/ML (10ML) SYRINGE FOR IV PUSH (FOR BLOOD PRESSURE SUPPORT)
PREFILLED_SYRINGE | INTRAVENOUS | Status: AC
  Filled 2023-10-11: qty 20

## 2023-10-11 MED ORDER — SODIUM CHLORIDE 0.9 % IV BOLUS
500.0000 mL | Freq: Once | INTRAVENOUS | Status: AC
  Administered 2023-10-11: 500 mL via INTRAVENOUS

## 2023-10-11 MED ORDER — CEFAZOLIN SODIUM-DEXTROSE 2-4 GM/100ML-% IV SOLN
2.0000 g | Freq: Four times a day (QID) | INTRAVENOUS | Status: AC
  Administered 2023-10-11 – 2023-10-12 (×3): 2 g via INTRAVENOUS
  Filled 2023-10-11 (×3): qty 100

## 2023-10-11 MED ORDER — OXYCODONE HCL 5 MG PO TABS
5.0000 mg | ORAL_TABLET | ORAL | Status: DC | PRN
  Administered 2023-10-12 – 2023-10-15 (×3): 10 mg via ORAL
  Filled 2023-10-11 (×5): qty 2

## 2023-10-11 MED ORDER — HYDROCODONE-ACETAMINOPHEN 5-325 MG PO TABS: 1.0000 | ORAL_TABLET | Freq: Four times a day (QID) | ORAL | Status: DC | PRN

## 2023-10-11 MED ORDER — FENTANYL CITRATE (PF) 100 MCG/2ML IJ SOLN
INTRAMUSCULAR | Status: AC
Start: 2023-10-11 — End: 2023-10-11
  Administered 2023-10-11: 25 ug via INTRAVENOUS
  Filled 2023-10-11: qty 2

## 2023-10-11 MED ORDER — FENTANYL CITRATE (PF) 250 MCG/5ML IJ SOLN
INTRAMUSCULAR | Status: DC | PRN
Start: 2023-10-11 — End: 2023-10-11
  Administered 2023-10-11: 25 ug via INTRAVENOUS
  Administered 2023-10-11: 50 ug via INTRAVENOUS

## 2023-10-11 MED ORDER — METHOCARBAMOL 500 MG PO TABS: 500.0000 mg | ORAL_TABLET | Freq: Four times a day (QID) | ORAL | Status: DC | PRN

## 2023-10-11 MED ORDER — SODIUM CHLORIDE 0.9 % IV SOLN: INTRAVENOUS | Status: DC

## 2023-10-11 MED ORDER — ENOXAPARIN SODIUM 40 MG/0.4ML IJ SOSY
40.0000 mg | PREFILLED_SYRINGE | INTRAMUSCULAR | Status: DC
  Administered 2023-10-12 – 2023-10-15 (×4): 40 mg via SUBCUTANEOUS
  Filled 2023-10-11 (×4): qty 0.4

## 2023-10-11 MED ORDER — MENTHOL 3 MG MT LOZG: 1.0000 | LOZENGE | OROMUCOSAL | Status: DC | PRN

## 2023-10-11 MED ORDER — 0.9 % SODIUM CHLORIDE (POUR BTL) OPTIME
TOPICAL | Status: DC | PRN
Start: 2023-10-11 — End: 2023-10-11
  Administered 2023-10-11: 1000 mL

## 2023-10-11 MED ORDER — FENTANYL CITRATE (PF) 100 MCG/2ML IJ SOLN
25.0000 ug | INTRAMUSCULAR | Status: DC | PRN
  Administered 2023-10-11: 50 ug via INTRAVENOUS
  Administered 2023-10-11: 25 ug via INTRAVENOUS

## 2023-10-11 MED ORDER — FENTANYL CITRATE PF 50 MCG/ML IJ SOSY
25.0000 ug | PREFILLED_SYRINGE | Freq: Once | INTRAMUSCULAR | Status: AC
  Administered 2023-10-11: 25 ug via INTRAVENOUS
  Filled 2023-10-11: qty 1

## 2023-10-11 MED ORDER — ACETAMINOPHEN 500 MG PO TABS
1000.0000 mg | ORAL_TABLET | Freq: Four times a day (QID) | ORAL | Status: DC
  Administered 2023-10-11 – 2023-10-12 (×3): 1000 mg via ORAL
  Filled 2023-10-11 (×3): qty 2

## 2023-10-11 MED ORDER — ONDANSETRON HCL 4 MG/2ML IJ SOLN
INTRAMUSCULAR | Status: DC | PRN
  Administered 2023-10-11: 4 mg via INTRAVENOUS

## 2023-10-11 MED ORDER — EPHEDRINE 5 MG/ML INJ
INTRAVENOUS | Status: AC
  Filled 2023-10-11: qty 5

## 2023-10-11 MED ORDER — LEVALBUTEROL HCL 0.63 MG/3ML IN NEBU: 0.6300 mg | INHALATION_SOLUTION | Freq: Four times a day (QID) | RESPIRATORY_TRACT | Status: DC | PRN

## 2023-10-11 MED ORDER — MAGNESIUM SULFATE 2 GM/50ML IV SOLN
2.0000 g | Freq: Once | INTRAVENOUS | Status: AC
  Administered 2023-10-11: 2 g via INTRAVENOUS
  Filled 2023-10-11 (×2): qty 50

## 2023-10-11 MED ORDER — HYDROMORPHONE HCL 1 MG/ML IJ SOLN
0.5000 mg | INTRAMUSCULAR | Status: DC | PRN
Start: 2023-10-11 — End: 2023-10-15

## 2023-10-11 MED ORDER — PHENYLEPHRINE HCL-NACL 20-0.9 MG/250ML-% IV SOLN
INTRAVENOUS | Status: DC | PRN
  Administered 2023-10-11: 100 ug/min via INTRAVENOUS

## 2023-10-11 MED ORDER — ALBUMIN HUMAN 5 % IV SOLN: INTRAVENOUS | Status: DC | PRN

## 2023-10-11 MED ORDER — ROCURONIUM BROMIDE 10 MG/ML (PF) SYRINGE
PREFILLED_SYRINGE | INTRAVENOUS | Status: DC | PRN
  Administered 2023-10-11: 50 mg via INTRAVENOUS

## 2023-10-11 SURGICAL SUPPLY — 45 items
BAG COUNTER SPONGE SURGICOUNT (BAG) ×1 IMPLANT
BIT DRILL INTERTAN LAG SCREW (BIT) IMPLANT
BIT DRILL LONG 4.0 (BIT) IMPLANT
BNDG COHESIVE 4X5 TAN STRL LF (GAUZE/BANDAGES/DRESSINGS) ×1 IMPLANT
BNDG COHESIVE 6X5 TAN ST LF (GAUZE/BANDAGES/DRESSINGS) IMPLANT
BNDG GAUZE DERMACEA FLUFF 4 (GAUZE/BANDAGES/DRESSINGS) ×1 IMPLANT
COVER PERINEAL POST (MISCELLANEOUS) ×1 IMPLANT
COVER SURGICAL LIGHT HANDLE (MISCELLANEOUS) ×1 IMPLANT
DRAPE C-ARMOR (DRAPES) ×1 IMPLANT
DRAPE STERI IOBAN 125X83 (DRAPES) ×1 IMPLANT
DRESSING MEPILEX FLEX 4X4 (GAUZE/BANDAGES/DRESSINGS) ×1 IMPLANT
DRILL BIT LONG 4.0 (BIT) ×1 IMPLANT
DRSG MEPILEX FLEX 4X4 (GAUZE/BANDAGES/DRESSINGS) ×1 IMPLANT
DRSG MEPILEX POST OP 4X8 (GAUZE/BANDAGES/DRESSINGS) ×1 IMPLANT
DURAPREP 26ML APPLICATOR (WOUND CARE) ×1 IMPLANT
ELECT REM PT RETURN 9FT ADLT (ELECTROSURGICAL) ×1 IMPLANT
ELECTRODE REM PT RTRN 9FT ADLT (ELECTROSURGICAL) ×1 IMPLANT
GAUZE PAD ABD 8X10 STRL (GAUZE/BANDAGES/DRESSINGS) ×2 IMPLANT
GLOVE BIOGEL PI IND STRL 7.0 (GLOVE) ×2 IMPLANT
GLOVE BIOGEL PI IND STRL 7.5 (GLOVE) ×1 IMPLANT
GLOVE ECLIPSE 7.0 STRL STRAW (GLOVE) ×1 IMPLANT
GLOVE SKINSENSE STRL SZ7.5 (GLOVE) ×2 IMPLANT
GLOVE SURG SYN 7.5 E (GLOVE) ×2 IMPLANT
GLOVE SURG SYN 7.5 PF PI (GLOVE) ×2 IMPLANT
GLOVE SURG UNDER POLY LF SZ7 (GLOVE) ×19 IMPLANT
GLOVE SURG UNDER POLY LF SZ7.5 (GLOVE) ×4 IMPLANT
GOWN STRL SURGICAL XL XLNG (GOWN DISPOSABLE) ×1 IMPLANT
GUIDE PIN 3.2X343 (PIN) ×2 IMPLANT
KIT BASIN OR (CUSTOM PROCEDURE TRAY) ×1 IMPLANT
KIT TURNOVER KIT B (KITS) ×1 IMPLANT
MANIFOLD NEPTUNE II (INSTRUMENTS) ×1 IMPLANT
NAIL TRIGEN INTERTAN 10X18CM (Nail) IMPLANT
NS IRRIG 1000ML POUR BTL (IV SOLUTION) ×1 IMPLANT
PACK GENERAL/GYN (CUSTOM PROCEDURE TRAY) ×1 IMPLANT
PAD ARMBOARD POSITIONER FOAM (MISCELLANEOUS) ×2 IMPLANT
PAD CAST 4YDX4 CTTN HI CHSV (CAST SUPPLIES) ×2 IMPLANT
PIN GUIDE 3.2X343MM (PIN) IMPLANT
SCREW LAG COMPR KIT 95/90 (Screw) IMPLANT
SCREW TRIGEN LOW PROF 5.0X32.5 (Screw) IMPLANT
STAPLER VISISTAT 35W (STAPLE) ×1 IMPLANT
SUT VIC AB 0 CT1 27XBRD ANBCTR (SUTURE) ×1 IMPLANT
SUT VIC AB 2-0 CT1 TAPERPNT 27 (SUTURE) ×1 IMPLANT
TOWEL GREEN STERILE (TOWEL DISPOSABLE) ×1 IMPLANT
TOWEL GREEN STERILE FF (TOWEL DISPOSABLE) ×1 IMPLANT
WATER STERILE IRR 1000ML POUR (IV SOLUTION) ×1 IMPLANT

## 2023-10-11 NOTE — Assessment & Plan Note (Signed)
 Iron studies done this month -Low percent saturation at 9, low iron at 27, otherwise normal -ferritin normal -significant b12 deficiency

## 2023-10-11 NOTE — Assessment & Plan Note (Signed)
 B12 of 50 on 09/30/23 Started on IM replacement

## 2023-10-11 NOTE — Anesthesia Procedure Notes (Signed)
 Procedure Name: Intubation Date/Time: 10/11/2023 12:10 PM  Performed by: Rachel Moulds, CRNAPre-anesthesia Checklist: Patient identified, Timeout performed, Emergency Drugs available, Suction available and Patient being monitored Patient Re-evaluated:Patient Re-evaluated prior to induction Oxygen Delivery Method: Circle system utilized Preoxygenation: Pre-oxygenation with 100% oxygen Induction Type: IV induction Ventilation: Mask ventilation without difficulty Laryngoscope Size: Mac and 3 Grade View: Grade I Tube type: Oral Tube size: 7.0 mm Number of attempts: 1 Airway Equipment and Method: Stylet Placement Confirmation: breath sounds checked- equal and bilateral, CO2 detector, positive ETCO2 and ETT inserted through vocal cords under direct vision Secured at: 22 cm Tube secured with: Tape Dental Injury: Teeth and Oropharynx as per pre-operative assessment

## 2023-10-11 NOTE — ED Provider Notes (Signed)
 [place] EM[facility]GENC[facility] DEPARTMENT AT [facility] Provider Note   CSN: [id number] Arrival date & time: 10/11/23  9629     History  Chief Complaint  Patient presents with   Fall   Hip Pain    Emily Blair is a 78 y.o. female.   Fall  Hip Pain  78 year old female with past medical history of hypertension and GERD presenting to the emergency department today with left hip pain.  The patient states that she slipped and fell at her rehab facility earlier today.  She is having some mild pain left hip since then.  She denies any focal weakness, numbness, or tingling.  She did not hit her head and denies loss of consciousness.  This was a mechanical fall.  She denies any pain in her knees or ankles.  Denies any pain in her upper extremities.  She denies any chest pain or shortness of breath.     Home Medications Prior to Admission medications   Medication Sig Start Date End Date Taking? Authorizing Provider  acetaminophen (TYLENOL) 650 MG CR tablet Take 650 mg by mouth every 8 (eight) hours as needed for pain.   Yes [provider]  carbamazepine (TEGRETOL XR) 400 MG 12 hr tablet Take 400 mg by mouth at bedtime. 05/14/14  Yes [provider]  cyanocobalamin (VITAMIN B12) 1000 MCG/ML injection Inject 1 mL (1,000 mcg total) into the muscle daily for 7 days, THEN 1 mL (1,000 mcg total) once a week for 7 days. 10/04/23 10/18/23 Yes Rai, Ripudeep K, MD  folic acid (FOLVITE) 1 MG tablet Take 1 tablet (1 mg total) by mouth daily. 10/04/23  Yes Rai, Ripudeep K, MD  levalbuterol (XOPENEX) 0.63 MG/3ML nebulizer solution Take 3 mLs (0.63 mg total) by nebulization every 6 (six) hours as needed for wheezing. 10/03/23  Yes Rai, Ripudeep K, MD  lisinopril (ZESTRIL) 20 MG tablet Take 20 mg by mouth daily.   Yes [provider]  metoprolol tartrate (LOPRESSOR) 50 MG tablet Take 1 tablet (50 mg total) by mouth 2 (two) times daily. 10/03/23  Yes Rai, Ripudeep K, MD  oxyCODONE  (OXY IR/ROXICODONE) 5 MG immediate release tablet Take 5 mg by mouth in the morning and at bedtime.   Yes [provider]  pantoprazole (PROTONIX) 40 MG tablet Take 1 tablet (40 mg total) by mouth 2 (two) times daily. 10/03/23  Yes Rai, Ripudeep K, MD  polyethylene glycol (MIRALAX / GLYCOLAX) 17 g packet Take 17 g by mouth daily. 10/04/23  Yes Rai, Ripudeep K, MD  QUEtiapine (SEROQUEL) 25 MG tablet Take 0.5 tablets (12.5 mg total) by mouth at bedtime. 10/03/23  Yes Rai, Ripudeep K, MD  senna-docusate (SENOKOT-S) 8.6-50 MG tablet Take 1 tablet by mouth 2 (two) times daily. 10/03/23  Yes Rai, Ripudeep K, MD  simvastatin (ZOCOR) 10 MG tablet Take 10 mg by mouth at bedtime. 09/21/23  Yes [provider]      Allergies    Patient has no known allergies.    Review of Systems   Review of Systems  Musculoskeletal:        R hip pain    Physical Exam Updated Vital Signs BP (!) 135/90 (BP Location: Left Arm)   Pulse 83   Temp 98.3 F (36.8 C) (Oral)   Resp 16   Ht 5' 7.01" (1.702 m)   Wt 64 kg   SpO2 100%   BMI 22.09 kg/m  Physical Exam Vitals and nursing note reviewed.  Gen: NAD Eyes: PERRL, EOMI HEENT: no oropharyngeal swelling Neck: trachea midline Resp: clear to auscultation bilaterally Card: RRR, no murmurs, rubs, or gallops Abd: nontender, nondistended Extremities: Left leg is shortened and externally rotated, mild tenderness over the proximal femur on the right, no significant tenderness over the knees or ankles bilaterally Vascular: 2+ radial pulses bilaterally, 2+ DP pulses bilaterally Neuro: The patient reports normal sensation throughout the bilateral lower extremities, she has 5 out of 5 strength with ankle flexion extension bilaterally, knee or hip strength is not tested due to pain Skin: no rashes Psyc: acting appropriately   ED Results / Procedures / Treatments   Labs (all labs ordered are listed, but only abnormal results are displayed) Labs Reviewed   BASIC METABOLIC PANEL - Abnormal; Notable for the following components:      Result Value   Sodium 132 (*)    Potassium 3.1 (*)    Chloride 96 (*)    Glucose, Bld 148 (*)    BUN 5 (*)    All other components within normal limits  CBC - Abnormal; Notable for the following components:   RBC 3.32 (*)    Hemoglobin 10.9 (*)    HCT 31.6 (*)    All other components within normal limits  MAGNESIUM - Abnormal; Notable for the following components:   Magnesium 1.6 (*)    All other components within normal limits  URINALYSIS, ROUTINE W REFLEX MICROSCOPIC - Abnormal; Notable for the following components:   APPearance CLOUDY (*)    Leukocytes,Ua SMALL (*)    All other components within normal limits  URINALYSIS, MICROSCOPIC (REFLEX) - Abnormal; Notable for the following components:   Bacteria, UA MANY (*)    All other components within normal limits  VITAMIN D 25 HYDROXY (VIT D DEFICIENCY, FRACTURES)  TYPE AND SCREEN    EKG None  Radiology DG Hip Unilat W or Wo Pelvis 2-3 Views Left Result Date: 10/11/2023 CLINICAL DATA:  Left hip pain status post fall EXAM: DG HIP (WITH OR WITHOUT PELVIS) 3V LEFT COMPARISON:  Left hip radiograph dated 03/19/2013 FINDINGS: Status post right hip arthroplasty. Hardware appears intact and well seated. Mildly displaced ossific fragment along the right greater trochanter may be postsurgical. Comminuted fracture of the intertrochanteric left femur with varus angulation. No acute dislocation. IMPRESSION: 1. Comminuted fracture of the intertrochanteric left femur with varus angulation. 2. Status post right hip arthroplasty. Mildly displaced ossific fragment along the right greater trochanter may be postsurgical. Electronically Signed   By: Agustin Cree M.D.   On: 10/11/2023 08:38    Procedures Procedures    Medications Ordered in ED Medications  morphine (PF) 2 MG/ML injection 2 mg ( Intravenous MAR Unhold 10/11/23 1436)  0.9 %  sodium chloride infusion (  Intravenous Anesthesia Volume Adjustment 10/11/23 1302)  metoprolol tartrate (LOPRESSOR) tablet 50 mg (has no administration in time range)  lisinopril (ZESTRIL) tablet 20 mg (has no administration in time range)  QUEtiapine (SEROQUEL) tablet 12.5 mg (has no administration in time range)  enoxaparin (LOVENOX) injection 40 mg (has no administration in time range)  ceFAZolin (ANCEF) IVPB 2g/100 mL premix (has no administration in time range)  acetaminophen (TYLENOL) tablet 1,000 mg (has no administration in time range)  acetaminophen (TYLENOL) tablet 325-650 mg (has no administration in time range)  HYDROmorphone (DILAUDID) injection 0.5-1 mg (has no administration in time range)  methocarbamol (ROBAXIN) tablet 500 mg (has no administration in time range)    Or  methocarbamol (ROBAXIN) injection 500  mg (has no administration in time range)  docusate sodium (COLACE) capsule 100 mg (has no administration in time range)  polyethylene glycol (MIRALAX / GLYCOLAX) packet 17 g (has no administration in time range)  sorbitol 70 % solution 30 mL (has no administration in time range)  magnesium citrate solution 1 Bottle (has no administration in time range)  ondansetron (ZOFRAN) tablet 4 mg (has no administration in time range)    Or  ondansetron (ZOFRAN) injection 4 mg (has no administration in time range)  alum & mag hydroxide-simeth (MAALOX/MYLANTA) 200-200-20 MG/5ML suspension 30 mL (has no administration in time range)  menthol-cetylpyridinium (CEPACOL) lozenge 3 mg (has no administration in time range)    Or  phenol (CHLORASEPTIC) mouth spray 1 spray (has no administration in time range)  tranexamic acid (CYKLOKAPRON) IVPB 1,000 mg (has no administration in time range)  oxyCODONE (Oxy IR/ROXICODONE) immediate release tablet 5-10 mg (has no administration in time range)  oxyCODONE (Oxy IR/ROXICODONE) immediate release tablet 10-15 mg (has no administration in time range)  pantoprazole (PROTONIX) EC  tablet 40 mg (has no administration in time range)  cyanocobalamin (VITAMIN B12) injection 1,000 mcg (has no administration in time range)  folic acid (FOLVITE) tablet 1 mg (has no administration in time range)  carbamazepine (TEGRETOL XR) 12 hr tablet 400 mg (has no administration in time range)  levalbuterol (XOPENEX) nebulizer solution 0.63 mg (has no administration in time range)  simvastatin (ZOCOR) tablet 10 mg (has no administration in time range)  magnesium sulfate IVPB 2 g 50 mL (has no administration in time range)  potassium chloride SA (KLOR-CON M) CR tablet 40 mEq (has no administration in time range)  acetaminophen (OFIRMEV) 10 MG/ML IV (  Canceled Entry 10/11/23 1455)  fentaNYL (SUBLIMAZE) injection 25 mcg (25 mcg Intravenous Given 10/11/23 0812)  ceFAZolin (ANCEF) IVPB 2g/100 mL premix ( Intravenous MAR Unhold 10/11/23 1436)  chlorhexidine (PERIDEX) 0.12 % solution 15 mL (15 mLs Mouth/Throat Given 10/11/23 1143)    Or  Oral care mouth rinse ( Mouth Rinse See Alternative 10/11/23 1143)  ceFAZolin (ANCEF) 2-4 GM/100ML-% IVPB (  Override pull for Anesthesia 10/11/23 1226)  acetaminophen (OFIRMEV) IV 1,000 mg (1,000 mg Intravenous New Bag/Given 10/11/23 1343)    ED Course/ Medical Decision Making/ A&P                                 Medical Decision Making 78 year old female with past medical history of hypertension and GERD presenting to the emergency department today with left hip pain after a mechanical fall at her nursing facility.  Obtain x-ray here to evaluate for fracture.  Suspect this is likely the case we will obtain basic labs here and obtain EKG.  I will give the patient fentanyl for pain and reevaluate for ultimate disposition.  The patient's EKG interpreted by me shows a sinus rhythm with normal axis, normal intervals, nonspecific ST-T changes.  We discussed the patient's case with orthopedics.  They recommend admission and will take the patient to the OR today.  She is  admitted for further evaluation management.  Amount and/or Complexity of Data Reviewed Labs: ordered. Radiology: ordered.  Risk Prescription drug management. Decision regarding hospitalization.           Final Clinical Impression(s) / ED Diagnoses Final diagnoses:  Closed 2-part intertrochanteric fracture of left femur, initial encounter (HCC)    Rx / DC Orders ED Discharge Orders  None         Durwin Glaze, MD 10/11/23 [phone]

## 2023-10-11 NOTE — Assessment & Plan Note (Addendum)
 Check magnesium-low replete  Replete and trend

## 2023-10-11 NOTE — Anesthesia Preprocedure Evaluation (Addendum)
 Anesthesia Evaluation  Patient identified by MRN, date of birth, ID band Patient awake    Reviewed: Allergy & Precautions, NPO status , Patient's Chart, lab work & pertinent test results  Airway Mallampati: III  TM Distance: >3 FB Neck ROM: Full    Dental  (+) Teeth Intact, Dental Advisory Given   Pulmonary neg pulmonary ROS   breath sounds clear to auscultation       Cardiovascular hypertension, Pt. on home beta blockers  Rhythm:Regular Rate:Normal  Echo:   1. Left ventricular ejection fraction, by estimation, is 70 to 75%. The  left ventricle has hyperdynamic function. The left ventricle has no  regional wall motion abnormalities. Left ventricular diastolic parameters  were normal.   2. Right ventricular systolic function is normal. The right ventricular  size is normal.   3. The mitral valve is normal in structure. No evidence of mitral valve  regurgitation. No evidence of mitral stenosis.   4. The aortic valve has an indeterminant number of cusps. Aortic valve  regurgitation is not visualized. No aortic stenosis is present.   5. Aortic dilatation noted. There is moderate dilatation of the ascending  aorta, measuring 43 mm.   6. The inferior vena cava is normal in size with greater than 50%  respiratory variability, suggesting right atrial pressure of 3 mmHg.     Neuro/Psych  PSYCHIATRIC DISORDERS      negative neurological ROS     GI/Hepatic Neg liver ROS,GERD  Medicated,,  Endo/Other  negative endocrine ROS    Renal/GU negative Renal ROS     Musculoskeletal  (+) Arthritis ,    Abdominal   Peds  Hematology  (+) Blood dyscrasia, anemia   Anesthesia Other Findings   Reproductive/Obstetrics                             Anesthesia Physical Anesthesia Plan  ASA: 2  Anesthesia Plan: General   Post-op Pain Management: Tylenol PO (pre-op)*   Induction: Intravenous  PONV Risk  Score and Plan: 4 or greater and Ondansetron, Dexamethasone and Midazolam  Airway Management Planned: Oral ETT  Additional Equipment: None  Intra-op Plan:   Post-operative Plan: Extubation in OR  Informed Consent: I have reviewed the patients History and Physical, chart, labs and discussed the procedure including the risks, benefits and alternatives for the proposed anesthesia with the patient or authorized representative who has indicated his/her understanding and acceptance.     Dental advisory given  Plan Discussed with: CRNA  Anesthesia Plan Comments:        Anesthesia Quick Evaluation

## 2023-10-11 NOTE — ED Triage Notes (Signed)
 Pt arrives via [facility] from [facility] with a fall around 4 am when she slipped and fell going to the bathroom. Pt endorses mild left hip pain. Leg is shortened and rotated.

## 2023-10-11 NOTE — H&P (Signed)
 History and Physical    Patient: Emily Blair:[id number] DOB: 01/27/1946 DOA: 10/11/2023 DOS: the patient was seen and examined on 10/11/2023 PCP: Maris Berger, MD  Patient coming from: ALF/[facility]. Ambulates independently    Chief Complaint: mechanical fall   HPI: Emily Blair is a 78 y.o. female with medical history significant of bipolar disorder, HTN, HLD, newly diagnosed PAF not on AC due to fall risk, B12 deficiency who presented to ED with complaints of mechanical fall today. She states early this AM she was in the living room and just slipped and fell onto her left side. She had immediate pain and was unable to get up. She is a poor historian.   She was admitted at [facility] from 3/7-3/15 for AMS/acute left sided weakness requiring elective intubation for airway protection. Work up negative, concern fro seizure. Had severe B12 deficiency and stared on tegretol. Treated for aspiration pneumonia and had PAF that was new during this stay. AC deferred due to falls and on lopressor 50mg  BID.    Denies any fever/chills, vision changes/headaches, chest pain or palpitations, shortness of breath, +cough, abdominal pain, N/V/D, dysuria or leg swelling.   She does not smoke or drink alcohol.   ER Course:  vitals: afebrile, bp: 123/83, HR: 82, RR: 18, oxygen: 99%RA Pertinent labs: hgb: 10.9, potassium: 3.1,  Left hip xray: comminuted fx of the intertrochanteric left femur with varus angulation. S/p right hip arthroplasty. Mildly displaced ossific fragment along the right greater trochanter.  In ED: ortho consulted. TRH asked to admit.     Review of Systems: As mentioned in the history of present illness. All other systems reviewed and are negative. Past Medical History:  Diagnosis Date   Arthritis    Bipolar 1 disorder (HCC)    Hypertension    Osteoporosis    Past Surgical History:  Procedure Laterality Date   FRACTURE SURGERY Right    hip   Social History:  reports that she  has never smoked. She does not have any smokeless tobacco history on file. She reports that she does not drink alcohol. No history on file for drug use.  No Known Allergies  History reviewed. No pertinent family history.  Prior to Admission medications   Medication Sig Start Date End Date Taking? Authorizing Provider  acetaminophen (ACETAMINOPHEN 8 HOUR) 650 MG CR tablet Take 650 mg by mouth every 6 (six) hours as needed for pain.    [provider]  carbamazepine (TEGRETOL XR) 400 MG 12 hr tablet Take 400 mg by mouth at bedtime. 05/14/14   [provider]  cyanocobalamin (VITAMIN B12) 1000 MCG/ML injection Inject 1 mL (1,000 mcg total) into the muscle daily for 7 days, THEN 1 mL (1,000 mcg total) once a week for 7 days. 10/04/23 10/18/23  Rai, Delene Ruffini, MD  folic acid (FOLVITE) 1 MG tablet Take 1 tablet (1 mg total) by mouth daily. 10/04/23   Rai, Delene Ruffini, MD  levalbuterol (XOPENEX) 0.63 MG/3ML nebulizer solution Take 3 mLs (0.63 mg total) by nebulization every 6 (six) hours as needed for wheezing. 10/03/23   Rai, Ripudeep K, MD  melatonin 3 MG TABS tablet Take 1 tablet (3 mg total) by mouth at bedtime as needed. 10/03/23   Rai, Delene Ruffini, MD  metoprolol tartrate (LOPRESSOR) 50 MG tablet Take 1 tablet (50 mg total) by mouth 2 (two) times daily. 10/03/23   Rai, Ripudeep K, MD  pantoprazole (PROTONIX) 40 MG tablet Take 1 tablet (40 mg  total) by mouth 2 (two) times daily. 10/03/23   Rai, Ripudeep K, MD  polyethylene glycol (MIRALAX / GLYCOLAX) 17 g packet Take 17 g by mouth daily. 10/04/23   Rai, Delene Ruffini, MD  QUEtiapine (SEROQUEL) 25 MG tablet Take 0.5 tablets (12.5 mg total) by mouth at bedtime. 10/03/23   Rai, Ripudeep K, MD  senna-docusate (SENOKOT-S) 8.6-50 MG tablet Take 1 tablet by mouth 2 (two) times daily. 10/03/23   Rai, Delene Ruffini, MD  simvastatin (ZOCOR) 10 MG tablet Take 10 mg by mouth at bedtime. 09/21/23   [provider]    Physical Exam: Vitals:    10/11/23 1100 10/11/23 1115 10/11/23 1133 10/11/23 1140  BP: 109/79 110/79    Pulse: 84 80    Resp: 12 19    Temp:    98.1 F (36.7 C)  TempSrc:    Oral  SpO2: 95% 94%    Weight:   64 kg   Height:   5' 7.01" (1.702 m)    General:  Appears calm and comfortable and is in NAD Eyes:  PERRL, EOMI, normal lids, iris ENT:  grossly normal hearing, lips & tongue, dry mucous membranes; appropriate dentition Neck:  no LAD, masses or thyromegaly; no carotid bruits Cardiovascular:  RRR, +systolic murmur. No LE edema.  Respiratory:   CTA bilaterally with no wheezes/rales/rhonchi.  Normal respiratory effort. Abdomen:  soft, NT, ND, NABS Back:   normal alignment, no CVAT Skin:  no rash or induration seen on limited exam Musculoskeletal:  grossly normal tone BUE. LLE: shortened and rotated. Pedal pulses intact. No edema of ankle/knee joint. Sensation intact. TTP over trochanteric region.  Lower extremity:  No LE edema.  Limited foot exam with no ulcerations.  2+ distal pulses. Psychiatric:  grossly normal mood and affect, speech fluent and appropriate, AOx3 Neurologic:  CN 2-12 grossly intact, moves all extremities in coordinated fashion, sensation intact   Radiological Exams on Admission: Independently reviewed - see discussion in A/P where applicable  DG Hip Unilat W or Wo Pelvis 2-3 Views Left Result Date: 10/11/2023 CLINICAL DATA:  Left hip pain status post fall EXAM: DG HIP (WITH OR WITHOUT PELVIS) 3V LEFT COMPARISON:  Left hip radiograph dated 03/19/2013 FINDINGS: Status post right hip arthroplasty. Hardware appears intact and well seated. Mildly displaced ossific fragment along the right greater trochanter may be postsurgical. Comminuted fracture of the intertrochanteric left femur with varus angulation. No acute dislocation. IMPRESSION: 1. Comminuted fracture of the intertrochanteric left femur with varus angulation. 2. Status post right hip arthroplasty. Mildly displaced ossific fragment along  the right greater trochanter may be postsurgical. Electronically Signed   By: Agustin Cree M.D.   On: 10/11/2023 08:38    EKG: Independently reviewed.  NSR with rate 78; nonspecific ST changes with no evidence of acute ischemia   Labs on Admission: I have personally reviewed the available labs and imaging studies at the time of the admission.  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 Work up overall insignificant, but concern for seizure/post ictal state -seizure precautions -continue tegretol   Essential hypertension Well controlled Continue lopressor 50mg  BID    Hyperlipidemia Continue zocor   Bipolar 1 disorder (HCC) On seroquel nightly   B12 deficiency B12 of 50 on 09/30/23 Started on IM replacement   GERD (gastroesophageal reflux disease) Continue PPI      Advance Care Planning:   Code Status: Full Code   Consults: ortho: Dr. Roda Shutters   DVT Prophylaxis: SCDs:   Family Communication: none   Severity of Illness: The appropriate patient status for this patient is INPATIENT. Inpatient status is judged to be reasonable and necessary in order to provide the required intensity of service to ensure the patient's safety. The patient's presenting symptoms, physical exam findings, and initial radiographic and laboratory data in the context of their chronic comorbidities is felt to place them at high risk for further clinical deterioration. Furthermore, it is not anticipated that the patient will be medically stable for discharge from the hospital within 2 midnights of admission.   * I certify that at the point of admission it is my clinical judgment that the patient will require inpatient hospital care spanning beyond 2 midnights from the point of admission due to high intensity of service, high risk for further deterioration and high frequency of surveillance required.*  Author: Orland Mustard, MD 10/11/2023 12:21 PM  For on call review [email or web address].

## 2023-10-11 NOTE — Assessment & Plan Note (Signed)
Continue zocor 

## 2023-10-11 NOTE — Assessment & Plan Note (Signed)
 Hospitalized 3/7-3/15 for AMS/left sided weakness. Work up overall insignificant, but concern for seizure/post ictal state -seizure precautions -continue tegretol

## 2023-10-11 NOTE — ED Notes (Signed)
 Patient transported to X-ray

## 2023-10-11 NOTE — Anesthesia Postprocedure Evaluation (Signed)
 Anesthesia Post Note  Patient: Emily Blair  Procedure(s) Performed: FIXATION, FRACTURE, INTERTROCHANTERIC, WITH INTRAMEDULLARY ROD (Left)     Patient location during evaluation: PACU Anesthesia Type: General Level of consciousness: awake and alert Pain management: pain level controlled Vital Signs Assessment: post-procedure vital signs reviewed and stable Respiratory status: spontaneous breathing, nonlabored ventilation, respiratory function stable and patient connected to nasal cannula oxygen Cardiovascular status: blood pressure returned to baseline and stable Postop Assessment: no apparent nausea or vomiting Anesthetic complications: no  No notable events documented.  Last Vitals:  Vitals:   10/11/23 1115 10/11/23 1140  BP: 110/79   Pulse: 80   Resp: 19   Temp:  36.7 C  SpO2: 94%     Last Pain:  Vitals:   10/11/23 1323  TempSrc:   PainSc: 9                  Shelton Silvas

## 2023-10-11 NOTE — Assessment & Plan Note (Signed)
 On seroquel nightly

## 2023-10-11 NOTE — Assessment & Plan Note (Signed)
 AC deferred due to fall history/risk  Continue lopressor 50mg  BID

## 2023-10-11 NOTE — Consult Note (Signed)
 ORTHOPAEDIC CONSULTATION  REQUESTING PHYSICIAN: Orland Mustard, MD  Chief Complaint: Left hip fracture  HPI: Emily Blair is a 78 y.o. female with medical history significant of bipolar disorder, HTN, HLD, newly diagnosed PAF not on AC due to fall risk, B12 deficiency who presented to ED with complaints of mechanical fall today. She states early this AM she was in the living room and just slipped and fell onto her left side. She had immediate pain and was unable to get up. She is a poor historian.    She was admitted at [facility] from 3/7-3/15 for AMS/acute left sided weakness requiring elective intubation for airway protection. Work up negative, concern fro seizure. Had severe B12 deficiency and stared on tegretol. Treated for aspiration pneumonia and had PAF that was new during this stay. AC deferred due to falls and on lopressor 50mg  BID.     Denies any fever/chills, vision changes/headaches, chest pain or palpitations, shortness of breath, +cough, abdominal pain, N/V/D, dysuria or leg swelling.    She does not smoke or drink alcohol.  Ortho consulted for surgical evaluation.  Past Medical History:  Diagnosis Date   Arthritis    Bipolar 1 disorder (HCC)    Hypertension    Osteoporosis    History reviewed. No pertinent surgical history. Social History   Socioeconomic History   Marital status: Widowed    Spouse name: Not on file   Number of children: Not on file   Years of education: Not on file   Highest education level: Not on file  Occupational History   Not on file  Tobacco Use   Smoking status: Never   Smokeless tobacco: Not on file  Substance and Sexual Activity   Alcohol use: No   Drug use: Not on file   Sexual activity: Not on file  Other Topics Concern   Not on file  Social History Narrative   Not on file   Social Drivers of Health   Financial Resource Strain: Not on file  Food Insecurity: Patient Unable To Answer (09/27/2023)   Hunger Vital Sign    Worried About  Running Out of Food in the Last Year: Patient unable to answer    Ran Out of Food in the Last Year: Patient unable to answer  Transportation Needs: Patient Unable To Answer (09/27/2023)   PRAPARE - Transportation    Lack of Transportation (Medical): Patient unable to answer    Lack of Transportation (Non-Medical): Patient unable to answer  Physical Activity: Not on file  Stress: Not on file  Social Connections: Patient Unable To Answer (09/27/2023)   Social Connection and Isolation Panel [NHANES]    Frequency of Communication with Friends and Family: Patient unable to answer    Frequency of Social Gatherings with Friends and Family: Patient unable to answer    Attends Religious Services: Patient unable to answer    Active Member of Clubs or Organizations: Patient unable to answer    Attends Banker Meetings: Patient unable to answer    Marital Status: Patient unable to answer   History reviewed. No pertinent family history. No Known Allergies Prior to Admission medications   Medication Sig Start Date End Date Taking? Authorizing Provider  acetaminophen (TYLENOL) 650 MG CR tablet Take 650 mg by mouth every 8 (eight) hours as needed for pain.   Yes [provider]  carbamazepine (TEGRETOL XR) 400 MG 12 hr tablet Take 400 mg by mouth at bedtime. 05/14/14  Yes [provider]  cyanocobalamin (VITAMIN B12) 1000 MCG/ML injection Inject 1 mL (1,000 mcg total) into the muscle daily for 7 days, THEN 1 mL (1,000 mcg total) once a week for 7 days. 10/04/23 10/18/23 Yes Rai, Ripudeep K, MD  folic acid (FOLVITE) 1 MG tablet Take 1 tablet (1 mg total) by mouth daily. 10/04/23  Yes Rai, Ripudeep K, MD  levalbuterol (XOPENEX) 0.63 MG/3ML nebulizer solution Take 3 mLs (0.63 mg total) by nebulization every 6 (six) hours as needed for wheezing. 10/03/23  Yes Rai, Ripudeep K, MD  lisinopril (ZESTRIL) 20 MG tablet Take 20 mg by mouth daily.   Yes [provider]  metoprolol  tartrate (LOPRESSOR) 50 MG tablet Take 1 tablet (50 mg total) by mouth 2 (two) times daily. 10/03/23  Yes Rai, Ripudeep K, MD  oxyCODONE (OXY IR/ROXICODONE) 5 MG immediate release tablet Take 5 mg by mouth in the morning and at bedtime.   Yes [provider]  pantoprazole (PROTONIX) 40 MG tablet Take 1 tablet (40 mg total) by mouth 2 (two) times daily. 10/03/23  Yes Rai, Ripudeep K, MD  polyethylene glycol (MIRALAX / GLYCOLAX) 17 g packet Take 17 g by mouth daily. 10/04/23  Yes Rai, Ripudeep K, MD  QUEtiapine (SEROQUEL) 25 MG tablet Take 0.5 tablets (12.5 mg total) by mouth at bedtime. 10/03/23  Yes Rai, Ripudeep K, MD  senna-docusate (SENOKOT-S) 8.6-50 MG tablet Take 1 tablet by mouth 2 (two) times daily. 10/03/23  Yes Rai, Ripudeep K, MD  simvastatin (ZOCOR) 10 MG tablet Take 10 mg by mouth at bedtime. 09/21/23  Yes [provider]   DG Hip Unilat W or Wo Pelvis 2-3 Views Left Result Date: 10/11/2023 CLINICAL DATA:  Left hip pain status post fall EXAM: DG HIP (WITH OR WITHOUT PELVIS) 3V LEFT COMPARISON:  Left hip radiograph dated 03/19/2013 FINDINGS: Status post right hip arthroplasty. Hardware appears intact and well seated. Mildly displaced ossific fragment along the right greater trochanter may be postsurgical. Comminuted fracture of the intertrochanteric left femur with varus angulation. No acute dislocation. IMPRESSION: 1. Comminuted fracture of the intertrochanteric left femur with varus angulation. 2. Status post right hip arthroplasty. Mildly displaced ossific fragment along the right greater trochanter may be postsurgical. Electronically Signed   By: Agustin Cree M.D.   On: 10/11/2023 08:38    All pertinent xrays, MRI, CT independently reviewed and interpreted  Positive ROS: All other systems have been reviewed and were otherwise negative with the exception of those mentioned in the HPI and as above.  Physical Exam: General: No acute distress Cardiovascular: No pedal  edema Respiratory: No cyanosis, no use of accessory musculature GI: No organomegaly, abdomen is soft and non-tender Skin: No lesions in the area of chief complaint Neurologic: Sensation intact distally Psychiatric: Patient is at baseline mood and affect Lymphatic: No axillary or cervical lymphadenopathy  MUSCULOSKELETAL:  - severe pain with movement of the hip and extremity - skin intact - NVI distally - compartments soft  Assessment: Left intertroch hip fracture  Plan: - surgical treatment is recommended for pain relief, quality of life and early mobilization - patient and family are aware of r/b/a and wish to proceed, informed consent obtained - medical optimization per primary team - surgery is planned for today  Thank you for the consult and the opportunity to see Ms. Amescua  N. Glee Arvin, MD [place] 11:19 AM

## 2023-10-11 NOTE — Assessment & Plan Note (Signed)
 Well controlled Continue lopressor 50mg  BID

## 2023-10-11 NOTE — Assessment & Plan Note (Signed)
 Continue PPI ?

## 2023-10-11 NOTE — Assessment & Plan Note (Addendum)
 78 year old female presenting with mechanical fall found to have comminuted fx of the intertrochanteric left femur with varus angulation -admit to med tele  -ortho consulted with plan for surgery today by Dr. Roda Shutters  -NPO  -SCDs -pain medication with tylenol, norco and morphine for severe pain -hip fracture protocol   -check UA, vitamin D, type and screen

## 2023-10-11 NOTE — Transfer of Care (Signed)
 Immediate Anesthesia Transfer of Care Note  Patient: Emily Blair  Procedure(s) Performed: FIXATION, FRACTURE, INTERTROCHANTERIC, WITH INTRAMEDULLARY ROD (Left)  Patient Location: PACU  Anesthesia Type:General  Level of Consciousness: awake and alert   Airway & Oxygen Therapy: Patient Spontanous Breathing and Patient connected to nasal cannula oxygen  Post-op Assessment: Report given to RN and Post -op Vital signs reviewed and stable  Post vital signs: Reviewed and stable  Last Vitals:  Vitals Value Taken Time  BP 105/93 10/11/23 1312  Temp    Pulse 95 10/11/23 1316  Resp 23 10/11/23 1316  SpO2 99 % 10/11/23 1316  Vitals shown include unfiled device data.  Last Pain:  Vitals:   10/11/23 1140  TempSrc: Oral  PainSc:          Complications: No notable events documented.

## 2023-10-11 NOTE — Op Note (Signed)
   Date of Surgery: 10/11/2023  INDICATIONS: Emily Blair is a 78 y.o.-year-old female who sustained a left hip fracture. The risks and benefits of the procedure discussed with the patient prior to the procedure and all questions were answered; consent was obtained.  PREOPERATIVE DIAGNOSIS: left intertrochanteric hip fracture   POSTOPERATIVE DIAGNOSIS: Same   PROCEDURE: Open treatment of intertrochanteric, pertrochanteric, subtrochanteric fracture with intramedullary implant. CPT [id number]   SURGEON: N. Glee Arvin, M.D.   ASSIST: None  ANESTHESIA: general   IV FLUIDS AND URINE: See anesthesia record   ESTIMATED BLOOD LOSS: 150 cc  IMPLANTS: [employer] InterTAN 10 x 180, 95/90 lag screws, 32.5 mm distal interlock  DRAINS: None.   COMPLICATIONS: see description of procedure.   DESCRIPTION OF PROCEDURE: The patient was brought to the operating room and placed supine on the operating table. The patient's leg had been signed prior to the procedure. The patient had the anesthesia placed by the anesthesiologist. The prep verification and incision time-outs were performed to confirm that this was the correct patient, site, side and location. The patient had an SCD on the opposite lower extremity. The patient did receive antibiotics prior to the incision and was re-dosed during the procedure as needed at indicated intervals. The patient was positioned on the fracture table with the table in traction and internal rotation to reduce the hip. The well leg was placed in a scissor position and all bony prominences were well-padded. The patient had the lower extremity prepped and draped in the standard surgical fashion. The incision was made 4 finger breadths superior to the greater trochanter. A guide pin was inserted into the tip of the greater trochanter under fluoroscopic guidance. An opening reamer was used to gain access to the femoral canal. The nail was inserted down the femoral canal to its proper  depth. The appropriate version of insertion for the lag screw was found under fluoroscopy. A pin was inserted up the femoral neck through the jig. Then, a second antirotation pin was inserted inferior to the first pin. The length of the lag screw was then measured. The lag screw was inserted as near to center-center in the head as possible. The antirotation pin was then taken out and an interdigitating compression screw was placed in its place. The leg was taken out of traction, then the interdigitating compression screw was used to compress across the fracture. Compression was visualized on serial xrays.  The set screw was fully tightened and then backed off a quarter turn to allow sliding of the lag screws.  A distal interlocking screw was placed using the jig through the static slot.  The wound was copiously irrigated with saline and the subcutaneous layer closed with 2.0 vicryl and the skin was reapproximated with staples. The wounds were cleaned and dried a final time and a sterile dressing was placed. The hip was taken through a range of motion at the end of the case under fluoroscopic imaging to visualize the approach-withdraw phenomenon and confirm implant length in the head. The patient was then awakened from anesthesia and taken to the recovery room in stable condition. All counts were correct at the end of the case.   POSTOPERATIVE PLAN: The patient will be weight bearing as tolerated and will return in 2 weeks for staple removal and the patient will receive DVT prophylaxis based on other medications, activity level, and risk ratio of bleeding to thrombosis.   Emily Reel, MD [facility] 12:57 PM

## 2023-10-12 ENCOUNTER — Encounter (HOSPITAL_COMMUNITY): Payer: Self-pay | Admitting: Orthopaedic Surgery

## 2023-10-12 DIAGNOSIS — E440 Moderate protein-calorie malnutrition: Secondary | ICD-10-CM | POA: Insufficient documentation

## 2023-10-12 DIAGNOSIS — S72142A Displaced intertrochanteric fracture of left femur, initial encounter for closed fracture: Secondary | ICD-10-CM | POA: Diagnosis not present

## 2023-10-12 LAB — CBC
HCT: 20.9 % — ABNORMAL LOW (ref 36.0–46.0)
Hemoglobin: 7.1 g/dL — ABNORMAL LOW (ref 12.0–15.0)
MCH: 32.9 pg (ref 26.0–34.0)
MCHC: 34 g/dL (ref 30.0–36.0)
MCV: 96.8 fL (ref 80.0–100.0)
Platelets: 248 10*3/uL (ref 150–400)
RBC: 2.16 MIL/uL — ABNORMAL LOW (ref 3.87–5.11)
RDW: 12.2 % (ref 11.5–15.5)
WBC: 4.4 10*3/uL (ref 4.0–10.5)
nRBC: 0 % (ref 0.0–0.2)

## 2023-10-12 LAB — BASIC METABOLIC PANEL
Anion gap: 10 (ref 5–15)
BUN: 9 mg/dL (ref 8–23)
CO2: 23 mmol/L (ref 22–32)
Calcium: 9.1 mg/dL (ref 8.9–10.3)
Chloride: 94 mmol/L — ABNORMAL LOW (ref 98–111)
Creatinine, Ser: 0.69 mg/dL (ref 0.44–1.00)
GFR, Estimated: >60 mL/min (ref >60)
Glucose, Bld: 115 mg/dL — ABNORMAL HIGH (ref 70–99)
Potassium: 3.7 mmol/L (ref 3.5–5.1)
Sodium: 127 mmol/L — ABNORMAL LOW (ref 135–145)

## 2023-10-12 MED ORDER — ENSURE ENLIVE PO LIQD
237.0000 mL | Freq: Two times a day (BID) | ORAL | Status: DC
  Administered 2023-10-13 – 2023-10-15 (×5): 237 mL via ORAL

## 2023-10-12 MED ORDER — ALBUMIN HUMAN 5 % IV SOLN
12.5000 g | Freq: Once | INTRAVENOUS | Status: AC
  Administered 2023-10-12: 12.5 g via INTRAVENOUS
  Filled 2023-10-12: qty 250

## 2023-10-12 MED ORDER — ADULT MULTIVITAMIN W/MINERALS CH
1.0000 | ORAL_TABLET | Freq: Every day | ORAL | Status: DC
  Administered 2023-10-12 – 2023-10-15 (×4): 1 via ORAL
  Filled 2023-10-12 (×4): qty 1

## 2023-10-12 MED ORDER — OXYCODONE HCL 5 MG PO TABS
5.0000 mg | ORAL_TABLET | Freq: Three times a day (TID) | ORAL | 0 refills | Status: AC | PRN
Start: 2023-10-12 — End: ?

## 2023-10-12 MED ORDER — ENOXAPARIN SODIUM 40 MG/0.4ML IJ SOSY: 40.0000 mg | PREFILLED_SYRINGE | INTRAMUSCULAR | 0 refills | Status: DC

## 2023-10-12 NOTE — Progress Notes (Addendum)
 Initial Nutrition Assessment  DOCUMENTATION CODES:   Non-severe (moderate) malnutrition in context of acute illness/injury  INTERVENTION:  Liberalize to regular diet to encourage PO intake and as patient has no dx of diabetes Ensure Enlive po BID, each supplement provides 350 kcal and 20 grams of protein. Magic cup TID with meals, each supplement provides 290 kcal and 9 grams of protein  MVI w/ minerals Collect new weight to assess trend since discharge  NUTRITION DIAGNOSIS:  Moderate Malnutrition related to acute illness as evidenced by mild fat depletion, moderate muscle depletion, severe muscle depletion. - note, she is with inadequate nutrition-related hx. Now appropriate for context of acute illness 2/2 multiple concurrent hospitalizations   GOAL:  Patient will meet greater than or equal to 90% of their needs  MONITOR:  PO intake, Supplement acceptance, Labs  REASON FOR ASSESSMENT:  Consult Assessment of nutrition requirement/status  ASSESSMENT:  Pt admitted s/p mechanical fall and found to have sustained L femur fx. Noted to have recently been admitted from 3/7-315 for AMS/acute L sided weakness requiring elective intubation. PMH: HTN. HLD, bipolar disorder, newly diagnosed PAF, B12 deficiency.   3/7: admitted, intubated, MRI unremarkable, s/p LP to r/o meningitis  3/9: extubated 3/10: diet advanced to regular, thin liquids 3/15: discharged to [facility] 3/23: re-admitted s/p mechanical fall, surgical intervention for L hip fx  Was previously admitted for AMS/acute left-sided weakness requiring elective intubation. She was at elevated nutrition risk and required refeeding protocol during previous admission suggesting poor intake at baseline. Degree of muscle and fat wasting illustrate this observation as well. Will recommend nutrition-related interventions from previous stay be re-initiated to assess efficacy.   Average Meal Intake  3/24: 100% x2 documented meals  No  family at bedside today during visit. She is unable to report appetite since being admitted to rehab center or prior to previous admission. States she has no problems chewing or swallowing. Intake appears to be improving. Some pain reported on exam, however not significant and only localized to surgical site. No N/V/C/D.  24 Hour Recall B: yogurt, eggs, water or coffee L: sandwich, unable to endorse sides D: protein, starch, veg  She cannot endorse UBW. Little to no history to review. Will suggest weight collection to assess trend since discharge. No significant edema observed. Has required fluid resuscitation. IV fluids  discontinued today d/t hyponatremia.   Admit/Current Weight: 64kg - recommend another weight collection, as this admission weight appears pulled forward from prior admission on 3/15  Did require potassium and magnesium repletion on 3/23. Of note, she does not appear with significant fluctuations in blood sugars or with dx of diabetes. Recommend liberalizing diet to encourage PO intake and increase vitamin/mineral consumption.   Meds: cyanocobalamin, docusate sodium, folic acid, IV ABX Drips:  98.1X albumin x1  Labs: Na+ 131>132>127 (L) K+ 3.1>3.7 (wdl) Mg 1.6 (L) CBGs 115-148 x24 hours  NUTRITION - FOCUSED PHYSICAL EXAM:  Flowsheet Row Most Recent Value  Orbital Region Severe depletion  Upper Arm Region Mild depletion  Thoracic and Lumbar Region No depletion  Buccal Region Mild depletion  Temple Region Severe depletion  Clavicle Bone Region Moderate depletion  Clavicle and Acromion Bone Region Mild depletion  Scapular Bone Region Mild depletion  Dorsal Hand Mild depletion  Patellar Region Severe depletion  Anterior Thigh Region Severe depletion  Posterior Calf Region Moderate depletion  Edema (RD Assessment) None  Hair Reviewed  Eyes Reviewed  Mouth Reviewed  Skin Reviewed  Nails Reviewed    Diet Order:  Diet Order             Diet Carb Modified  Fluid consistency: Thin; Room service appropriate? Yes  Diet effective now             EDUCATION NEEDS:   Not appropriate for education at this time  Skin:  Skin Assessment: Reviewed RN Assessment  Last BM:  prior to admission  Height:  Ht Readings from Last 1 Encounters:  10/11/23 5' 7.01" (1.702 m)   Weight:  Wt Readings from Last 1 Encounters:  10/11/23 64 kg   Ideal Body Weight:  61.4 kg  BMI:  Body mass index is 22.09 kg/m.  Estimated Nutritional Needs:   Kcal:  1700-1900kcal  Protein:  85-100g  Fluid:  >1.7L/day  Myrtie Cruise MS, RD, LDN Registered Dietitian Clinical Nutrition RD Inpatient Contact Info in Amion

## 2023-10-12 NOTE — Evaluation (Signed)
 Occupational Therapy Evaluation Patient Details Name: Emily Blair MRN: [id number] DOB: [birth year] Today's Date: 10/12/2023   History of Present Illness   Pt is a 78 y.o. female admitted to [facility] from [facility] with a fall. X-ray showed L femur fx. Fixation with intramedullary rod performed 3/23. L WBAT  Recently dc from [facility] 3/15 after suspected unwitness seizure. PMH - dementia, arthritis, bipolar, htn, humerus fx, osteoporosis     Clinical Impressions Pt admitted based on above, and was seen based on problem list below. PTA pt was completing rehab at a skilled nursing facility and receiving min to mod assistance with ADLs. Today pt is requiring set up  to total for  ADLs. Unable to complete functional transfers d/t pt's pain and fatigue levels. Recommendation of <3 hours of skilled rehab daily to optimize independence levels. OT will continue to follow acutely to maximize functional independence.        If plan is discharge home, recommend the following:   Two people to help with walking and/or transfers;A lot of help with bathing/dressing/bathroom;Assistance with cooking/housework;Direct supervision/assist for medications management;Direct supervision/assist for financial management;Supervision due to cognitive status     Functional Status Assessment   Patient has had a recent decline in their functional status and demonstrates the ability to make significant improvements in function in a reasonable and predictable amount of time.     Equipment Recommendations   Other (comment) (Defer to next venue)      Precautions/Restrictions   Precautions Precautions: Fall Recall of Precautions/Restrictions: Impaired     Mobility Bed Mobility Overal bed mobility: Needs Assistance             General bed mobility comments: Pt received in recliner    Transfers Overall transfer level: Needs assistance Equipment used: 1 person hand held  assist Transfers: Sit to/from Stand Sit to Stand: Max assist           General transfer comment: Unable to complete functional transfer d/t pain and fatigue      Balance Overall balance assessment: Needs assistance Sitting-balance support: No upper extremity supported, Feet supported Sitting balance-Leahy Scale: Fair   Postural control: Posterior lean               ADL either performed or assessed with clinical judgement   ADL Overall ADL's : Needs assistance/impaired Eating/Feeding: Set up;Sitting   Grooming: Set up;Sitting           Upper Body Dressing : Set up;Sitting   Lower Body Dressing: Total assistance;Cueing for safety;Sit to/from stand Lower Body Dressing Details (indicate cue type and reason): Pt unable to reach LEs d/t pain to don socks Toilet Transfer: Maximal assistance;+2 for safety/equipment;+2 for physical assistance (Bed pan) Toilet Transfer Details (indicate cue type and reason): Pt moaning and c/o of pains unable to come to full stand to transfer. +2 to slide bed pan under pt, while in squat Toileting- Clothing Manipulation and Hygiene: Total assistance;Sit to/from stand         General ADL Comments: Pt attempted one STS and unable to come to full stand d/t pain.     Vision Baseline Vision/History: 0 No visual deficits              Pertinent Vitals/Pain Pain Assessment Pain Assessment: Faces Faces Pain Scale: Hurts whole lot Pain Location: L leg Pain Descriptors / Indicators: Aching, Grimacing, Guarding, Moaning, Operative site guarding Pain Intervention(s): Limited activity within patient's tolerance, Repositioned, Patient requesting pain meds-RN notified  Extremity/Trunk Assessment Upper Extremity Assessment Upper Extremity Assessment: Generalized weakness;Right hand dominant   Lower Extremity Assessment Lower Extremity Assessment: Defer to PT evaluation   Cervical / Trunk Assessment Cervical / Trunk Assessment: Normal    Communication Communication Communication: Impaired Factors Affecting Communication: Difficulty expressing self   Cognition Arousal: Alert Behavior During Therapy: Impulsive Cognition: History of cognitive impairments, No family/caregiver present to determine baseline             OT - Cognition Comments: Pt A & O x4 but poor stm and awareness of limitations                 Following commands: Impaired Following commands impaired: Follows one step commands inconsistently, Follows one step commands with increased time     Cueing  General Comments   Cueing Techniques: Verbal cues;Gestural cues;Tactile cues  VSS on RA   Exercises     Shoulder Instructions      Home Living Family/patient expects to be discharged to:: Skilled nursing facility               Additional Comments: facility      Prior Functioning/Environment Prior Level of Function : Patient poor historian/Family not available               ADLs Comments: Per pt report, staff assisting with ADLs    OT Problem List: Decreased strength;Decreased activity tolerance;Impaired balance (sitting and/or standing);Decreased cognition;Decreased safety awareness;Pain   OT Treatment/Interventions: Self-care/ADL training;Therapeutic exercise;DME and/or AE instruction;Therapeutic activities;Cognitive remediation/compensation;Patient/family education;Balance training      OT Goals(Current goals can be found in the care plan section)   Acute Rehab OT Goals Patient Stated Goal: Pt unable to participate OT Goal Formulation: Patient unable to participate in goal setting Time For Goal Achievement: 10/26/23 Potential to Achieve Goals: Good   OT Frequency:  Min 1X/week    AM-PAC OT "6 Clicks" Daily Activity     Outcome Measure Help from another person eating meals?: A Little Help from another person taking care of personal grooming?: A Little Help from another person toileting, which includes using  toliet, bedpan, or urinal?: Total Help from another person bathing (including washing, rinsing, drying)?: A Lot Help from another person to put on and taking off regular upper body clothing?: A Little Help from another person to put on and taking off regular lower body clothing?: Total 6 Click Score: 13   End of Session Equipment Utilized During Treatment: Gait belt Nurse Communication: Mobility status  Activity Tolerance: Patient limited by fatigue;Patient limited by pain Patient left: in chair;with call bell/phone within reach;with chair alarm set  OT Visit Diagnosis: Unsteadiness on feet (R26.81);Muscle weakness (generalized) (M62.81);History of falling (Z91.81);Other abnormalities of gait and mobility (R26.89);Repeated falls (R29.6)                Time: 8295-6213 OT Time Calculation (min): 28 min Charges:  OT General Charges $OT Visit: 1 Visit OT Evaluation $OT Eval Moderate Complexity: 1 Mod OT Treatments $Self Care/Home Management : 8-22 mins  Ivor Messier, OT  Acute Rehabilitation Services Office [phone] Secure chat preferred   Marilynne Drivers 10/12/2023, 12:42 PM

## 2023-10-12 NOTE — Discharge Instructions (Signed)
    1. Change dressings as needed 2. May shower but keep incisions covered and dry 3. Take your prescribed blood thinner to prevent blood clots 4. Take stool softeners as needed 5. Take pain meds as needed  I have reviewed the patient's history and given the presence of a fragility fracture, I have deemed the necessity of a osteoporosis management referral or confirmed that the patient is currently enrolled in a osteoporosis treatment program.

## 2023-10-12 NOTE — Progress Notes (Signed)
 Subjective: 1 Day Post-Op Procedure(s) (LRB): FIXATION, FRACTURE, INTERTROCHANTERIC, WITH INTRAMEDULLARY ROD (Left) Patient reports pain as mild.  Resting comfortably  Objective: Vital signs in last 24 hours: Temp:  [97.8 F (36.6 C)-98.5 F (36.9 C)] 98.5 F (36.9 C) (03/24 0803) Pulse Rate:  [61-98] 69 (03/24 0803) Resp:  [12-21] 18 (03/24 0803) BP: (76-135)/(34-93) 125/74 (03/24 0803) SpO2:  [96 %-100 %] 100 % (03/24 0803)  Intake/Output from previous day: 03/23 0701 - 03/24 0700 In: 1620 [I.V.:820.1; IV Piggyback:799.9] Out: 350 [Urine:200; Blood:150] Intake/Output this shift: Total I/O In: 120 [P.O.:120] Out: -   Recent Labs    10/11/23 0808 10/12/23 0955  HGB 10.9* 7.1*   Recent Labs    10/11/23 0808 10/12/23 0955  WBC 5.2 4.4  RBC 3.32* 2.16*  HCT 31.6* 20.9*  PLT 342 248   Recent Labs    10/11/23 0808 10/12/23 0801  NA 132* 127*  K 3.1* 3.7  CL 96* 94*  CO2 28 23  BUN 5* 9  CREATININE 0.55 0.69  GLUCOSE 148* 115*  CALCIUM 9.7 9.1   No results for input(s): "LABPT", "INR" in the last 72 hours.  Neurologically intact Neurovascular intact Sensation intact distally Intact pulses distally Dorsiflexion/Plantar flexion intact Incision: dressing C/D/I No cellulitis present Compartment soft   Assessment/Plan: 1 Day Post-Op Procedure(s) (LRB): FIXATION, FRACTURE, INTERTROCHANTERIC, WITH INTRAMEDULLARY ROD (Left) Up with therapy WBAT LLE ABLA- mild and stable Dvt ppx- lovenox/scds F/u with xu/Kamill Fulbright two weeks po     Cristie Hem 10/12/2023, 12:55 PM

## 2023-10-12 NOTE — Evaluation (Signed)
 Physical Therapy Evaluation  Patient Details Name: Emily Blair MRN: [id number] DOB: November 02, 1945 Today's Date: 10/12/2023  History of Present Illness  Pt is a 78 y.o. female admitted [facility] with a fall. X-ray showed L femur fx. Fixation with intramedullary rod performed 3/23. L WBAT  Recently dc from [facility] 3/15 after suspected unwitness seizure. PMH - dementia, arthritis, bipolar, htn, humerus fx, osteoporosis   Clinical Impression  Pt admitted with above diagnosis. Pt currently with functional limitations due to the deficits listed below (see PT Problem List). At the time of PT eval pt was able to perform transfers with up to +2 max assist. Attempted with RW however unable to successfully achieve full standing position. Pt is from SNF, and recommend return to short term rehab to maximize functional independence, safety, and decrease risk for falls. Acutely, pt will benefit from skilled PT to increase their independence and safety with mobility to allow discharge.           If plan is discharge home, recommend the following: A lot of help with bathing/dressing/bathroom;Direct supervision/assist for medications management;Assist for transportation;Two people to help with walking and/or transfers;Help with stairs or ramp for entrance;Supervision due to cognitive status   Can travel by private vehicle   No    Equipment Recommendations Wheelchair (measurements PT);Wheelchair cushion (measurements PT)  Recommendations for Other Services       Functional Status Assessment Patient has had a recent decline in their functional status and demonstrates the ability to make significant improvements in function in a reasonable and predictable amount of time.     Precautions / Restrictions Precautions Precautions: Fall Recall of Precautions/Restrictions: Impaired Restrictions Weight Bearing Restrictions Per Provider Order: Yes LLE Weight Bearing Per Provider Order:  Weight bearing as tolerated      Mobility  Bed Mobility Overal bed mobility: Needs Assistance Bed Mobility: Supine to Sit     Supine to sit: Mod assist, +2 for physical assistance     General bed mobility comments: Assist for LE advancement towards EOB as well as for trunk elevation to full sitting position. Bed pad utilzied to complete transition to EOB and get feet on the floor.    Transfers Overall transfer level: Needs assistance Equipment used: Rolling walker (2 wheels), 2 person hand held assist Transfers: Sit to/from Stand, Bed to chair/wheelchair/BSC Sit to Stand: Max assist     Squat pivot transfers: Max assist, +2 physical assistance     General transfer comment: Initially with RW and pt unable to complete full stand. Upon bearing weight, pt began pushing feet anteriorly and shoulders back until she sat back down on the bed. Attempted again with +2 assist and no AD. Pt able to squat pivot from bed to chair.    Ambulation/Gait               General Gait Details: Unable to progress to gait training at this time.  Stairs            Wheelchair Mobility     Tilt Bed    Modified Rankin (Stroke Patients Only)       Balance Overall balance assessment: Needs assistance Sitting-balance support: No upper extremity supported, Feet supported Sitting balance-Leahy Scale: Poor   Postural control: Right lateral lean (To offload L hip) Standing balance support: Bilateral upper extremity supported Standing balance-Leahy Scale: Zero Standing balance comment: +2 assist required  Pertinent Vitals/Pain Pain Assessment Pain Assessment: Faces Faces Pain Scale: Hurts whole lot Pain Location: L leg Pain Descriptors / Indicators: Grimacing, Moaning, Operative site guarding, Sore Pain Intervention(s): Limited activity within patient's tolerance, Monitored during session, Repositioned    Home Living Family/patient expects  to be discharged to:: Skilled nursing facility                        Prior Function Prior Level of Function : Patient poor historian/Family not available             Mobility Comments: Pt presented s/p fall ADLs Comments: Anticipate pt was requiring assist for ADL's     Extremity/Trunk Assessment   Upper Extremity Assessment Upper Extremity Assessment: Defer to OT evaluation    Lower Extremity Assessment Lower Extremity Assessment: Generalized weakness;LLE deficits/detail LLE Deficits / Details: Acute pain, decreased strength and AROM consistent with above mentioned surgery LLE: Unable to fully assess due to pain    Cervical / Trunk Assessment Cervical / Trunk Assessment: Other exceptions Cervical / Trunk Exceptions: Forward head posture with rounded shoulders  Communication   Communication Communication: Impaired Factors Affecting Communication: Difficulty expressing self    Cognition Arousal: Alert Behavior During Therapy: Restless, Anxious   PT - Cognitive impairments: History of cognitive impairments                         Following commands: Impaired Following commands impaired: Follows one step commands inconsistently, Follows one step commands with increased time     Cueing Cueing Techniques: Verbal cues, Gestural cues, Tactile cues     General Comments General comments (skin integrity, edema, etc.): VSS on RA    Exercises General Exercises - Lower Extremity Long Arc Quad: 10 reps (decreased AROM)   Assessment/Plan    PT Assessment Patient needs continued PT services  PT Problem List Decreased strength;Decreased activity tolerance;Decreased balance;Decreased mobility;Decreased cognition;Decreased safety awareness       PT Treatment Interventions DME instruction;Gait training;Functional mobility training;Therapeutic activities;Therapeutic exercise;Balance training;Neuromuscular re-education;Cognitive remediation;Patient/family  education    PT Goals (Current goals can be found in the Care Plan section)  Acute Rehab PT Goals Patient Stated Goal: unable to state PT Goal Formulation: Patient unable to participate in goal setting Time For Goal Achievement: 10/26/23 Potential to Achieve Goals: Fair    Frequency Min 1X/week     Co-evaluation               AM-PAC PT "6 Clicks" Mobility  Outcome Measure Help needed turning from your back to your side while in a flat bed without using bedrails?: A Little Help needed moving from lying on your back to sitting on the side of a flat bed without using bedrails?: A Little Help needed moving to and from a bed to a chair (including a wheelchair)?: A Little Help needed standing up from a chair using your arms (e.g., wheelchair or bedside chair)?: A Little Help needed to walk in hospital room?: Total Help needed climbing 3-5 steps with a railing? : Total 6 Click Score: 14    End of Session Equipment Utilized During Treatment: Gait belt Activity Tolerance: Patient tolerated treatment well Patient left: in bed;with call bell/phone within reach;with bed alarm set;with family/visitor present Nurse Communication: Mobility status PT Visit Diagnosis: Unsteadiness on feet (R26.81);Other abnormalities of gait and mobility (R26.89);Muscle weakness (generalized) (M62.81);Difficulty in walking, not elsewhere classified (R26.2);Other symptoms and signs involving the nervous system (R29.898)  Time: 1000-1020 PT Time Calculation (min) (ACUTE ONLY): 20 min   Charges:   PT Evaluation $PT Eval Moderate Complexity: 1 Mod   PT General Charges $$ ACUTE PT VISIT: 1 Visit         Conni Slipper, PT, DPT Acute Rehabilitation Services Secure Chat Preferred Office: [phone]   Emily Blair 10/12/2023, 2:28 PM

## 2023-10-12 NOTE — Progress Notes (Signed)
 Transition of Care [facility]) - CAGE-AID Screening   Patient Details  Name: Emily Blair MRN: [id number] Date of Birth: 26-Nov-1945  Transition of Care [facility]) CM/SW Contact:    Katha Hamming, RN Phone Number: 10/12/2023, 11:10 PM   Clinical Narrative:  Denies drug and alcohol use, no resources indicated.  CAGE-AID Screening:    Have You Ever Felt You Ought to Cut Down on Your Drinking or Drug Use?: No Have People Annoyed You By Critizing Your Drinking Or Drug Use?: No Have You Felt Bad Or Guilty About Your Drinking Or Drug Use?: No Have You Ever Had a Drink or Used Drugs First Thing In The Morning to Steady Your Nerves or to Get Rid of a Hangover?: No CAGE-AID Score: 0  Substance Abuse Education Offered: No

## 2023-10-12 NOTE — Progress Notes (Signed)
 PROGRESS NOTE    Emily Blair  ZOX:[id number] DOB: 04/14/1946 DOA: 10/11/2023 PCP: Maris Berger, MD    Brief Narrative:   Emily Blair is a 78 y.o. female with past medical history significant for HTN, HLD, newly diagnosed paroxysmal atrial fibrillation not anticoagulation due to fall risk, bipolar disorder, B12 deficiency presented to [facility] ED on 10/11/2023 from [facility] via EMS complaining of left hip pain following mechanical fall.  Patient reports fall attempting to go to the bathroom earlier in the morning.  Fell on her left side with immediate pain and able to get up.  Patient denies any fever/chills, no vision changes, no headaches, no chest pain, no palpitations, no shortness of breath, no abdominal pain, no nausea/vomiting/diarrhea, no urinary symptoms or lower extremity edema.  Denies alcohol or tobacco use.  Recent admission to [facility] from 3/7 - 3/15 for altered mental status/acute left-sided weakness requiring elective intubation for airway protection.  Workup essentially unrevealing but concern for seizure.  Had severe B12 deficiency and started on Tegretol.  Treated for aspiration pneumonia had paroxysmal atrial fibrillation that was new during this stay.  Anticoagulation deferred to frequent falls and was started on Lopressor 50 mg p.o. twice daily.  In the ED, temperature 98.3 F, HR 60, RR 16, BP 1 7/71, SpO2 100% on room air.  WBC 5.2, hemoglobin 10.9, platelet count 342.  Sodium 132, potassium 3.1, chloride 96, CO2 28, glucose 148, BUN 5, creatinine 0.55.  Vitamin D25 hydroxy 32.31.  Urinalysis with small leukocytes, no nitrite, many bacteria, 11-20 WBCs.  Left hip x-ray with comminuted fracture of the intertrochanteric left femur with varus angulation.  Orthopedics was consulted.  TRH consulted for admission for further evaluation management of left hip fracture.  Assessment & Plan:   Left hip intertrochanteric fracture Patient presenting to ED  with following mechanical fall.  Unable to ambulate with severe left hip pain.  Imaging on admission with comminuted fracture of the anterior Karrick left femur with varus angulation.  Orthopedics was consulted and patient underwent intramedullary implant by Dr. Roda Shutters on 10/11/2023.  Vitamin D 25-hydroxy level 32.31. -- Orthopedics following, appreciate assistance -- WBAT LLE -- Oxycodone 5 to 10 mg p.o. every 4 hours as needed moderate pain -- Oxycodone 10-50 mg p.o. every 4 hours as needed severe pain -- Dilaudid 0.5-1 mg IV every 4 hours as needed severe pain not relieved with oral medication -- Robaxin 5 mg p.o. every 6 hours as needed muscle spasms -- Lovenox for postoperative DVT prophylaxis -- PT/OT evaluation: Pending -- Outpatient follow-up with orthopedics in 2 weeks for staple removal  Acute postoperative blood loss anemia -- Hgb 10.9>7.1 -- Check anemia panel -- Repeat CBC in a.m. -- Transfuse for hemoglobin less than 7.0  Hypokalemia Potassium 3.1, repleted. -- Repeat BMP in am  Hyponatremia Etiology likely secondary to poor oral intake.  Supported with IV fluid hydration. -- Na 132>127 -- Discontinue IV fluids today -- Encourage increased oral intake -- BMP in the a.m.  Essential hypertension -- Metoprolol tartrate 50 g p.o. twice daily -- Lisinopril 10 mg p.o. daily  Hyperlipidemia -- Simvastatin 10 mg p.o. daily  Severe B12 deficiency -- Vitamin B12 1000 mcg IM weekly  Bipolar 1 disorder -- Tegretol 400 mg p.o. nightly -- Seroquel 12.5 mg p.o. nightly  Paroxysmal atrial fibrillation Recently diagnosed at previous hospitalization.  Not on anticoagulation due to fall risk. -- Metoprolol tartrate 50 mg p.o. twice daily -- Outpatient follow-up  with cardiology  GERD -- Protonix 40 minutes p.o. twice daily   DVT prophylaxis: enoxaparin (LOVENOX) injection 40 mg Start: 10/12/23 0800 SCDs Start: 10/11/23 1438 Place TED hose Start: 10/11/23 1438 SCDs Start:  10/11/23 1008    Code Status: Full Code Family Communication: No family present at bedside this morning  Disposition Plan:  Level of care: Telemetry Medical Status is: Inpatient Remains inpatient appropriate because: Pending PT/OT evaluation, likely need return to SNF, [facility] consulted    Consultants:  Orthopedics, Dr. Roda Shutters  Procedures:  Open treatment of intertrochanteric, pertrochanteric, subtrochanteric fracture with intramedullary implant, Dr. Roda Shutters 10/11/2023   Antimicrobials:  Perioperative cefazolin   Subjective: Patient seen examined bedside, lying in bed.  Reports some soreness to left hip.  Otherwise no other specific complaints or concerns at this time.  Awaiting PT/OT evaluation.  Denies headache, no visual changes, no chest pain, no palpitations, no shortness of breath, no abdominal pain, no fever/chills/night sweats, no nausea/vomiting/diarrhea, no focal weakness, no fatigue, no paresthesias.  No acute events overnight per nurse staff.  Objective: Vitals:   10/12/23 0031 10/12/23 0511 10/12/23 0803 10/12/23 1322  BP: (!) 89/57 117/64 125/74 107/71  Pulse:  64 69 60  Resp:  16 18 16   Temp:  98.3 F (36.8 C) 98.5 F (36.9 C) 98.3 F (36.8 C)  TempSrc:   Oral Oral  SpO2:  99% 100% 100%  Weight:      Height:        Intake/Output Summary (Last 24 hours) at 10/12/2023 1328 Last data filed at 10/12/2023 0900 Gross per 24 hour  Intake 789.99 ml  Output 200 ml  Net 589.99 ml   Filed Weights   10/11/23 0752 10/11/23 1133  Weight: 64 kg 64 kg    Examination:  Physical Exam: GEN: NAD, alert and oriented x 3, elderly appearance HEENT: NCAT, PERRL, EOMI, sclera clear, MMM PULM: CTAB w/o wheezes/crackles, normal respiratory effort, on room air CV: RRR w/o M/G/R GI: abd soft, NTND, NABS, no R/G/M MSK: no peripheral edema, moves all presumedly, noted surgical incision site left hip with dressings in place, clean/dry/intact without surrounding erythema/fluctuance or  ecchymosis NEURO: No focal neurological deficit PSYCH: normal mood/affect Integumentary: Left hip surgical incision site as above, otherwise no other concerning rashes/lesions/wounds noted on exposed skin surfaces.      Data Reviewed: I have personally reviewed following labs and imaging studies  CBC: Recent Labs  Lab 10/11/23 0808 10/12/23 0955  WBC 5.2 4.4  HGB 10.9* 7.1*  HCT 31.6* 20.9*  MCV 95.2 96.8  PLT 342 248   Basic Metabolic Panel: Recent Labs  Lab 10/11/23 0808 10/11/23 0935 10/12/23 0801  NA 132*  --  127*  K 3.1*  --  3.7  CL 96*  --  94*  CO2 28  --  23  GLUCOSE 148*  --  115*  BUN 5*  --  9  CREATININE 0.55  --  0.69  CALCIUM 9.7  --  9.1  MG  --  1.6*  --    GFR: Estimated Creatinine Clearance: 57.3 mL/min (by C-G formula based on SCr of 0.69 mg/dL). Liver Function Tests: No results for input(s): "AST", "ALT", "ALKPHOS", "BILITOT", "PROT", "ALBUMIN" in the last 168 hours. No results for input(s): "LIPASE", "AMYLASE" in the last 168 hours. No results for input(s): "AMMONIA" in the last 168 hours. Coagulation Profile: No results for input(s): "INR", "PROTIME" in the last 168 hours. Cardiac Enzymes: No results for input(s): "CKTOTAL", "CKMB", "CKMBINDEX", "TROPONINI" in  the last 168 hours. BNP (last 3 results) No results for input(s): "PROBNP" in the last 8760 hours. HbA1C: No results for input(s): "HGBA1C" in the last 72 hours. CBG: No results for input(s): "GLUCAP" in the last 168 hours. Lipid Profile: No results for input(s): "CHOL", "HDL", "LDLCALC", "TRIG", "CHOLHDL", "LDLDIRECT" in the last 72 hours. Thyroid Function Tests: No results for input(s): "TSH", "T4TOTAL", "FREET4", "T3FREE", "THYROIDAB" in the last 72 hours. Anemia Panel: No results for input(s): "VITAMINB12", "FOLATE", "FERRITIN", "TIBC", "IRON", "RETICCTPCT" in the last 72 hours. Sepsis Labs: No results for input(s): "PROCALCITON", "LATICACIDVEN" in the last 168 hours.  No  results found for this or any previous visit (from the past 240 hours).       Radiology Studies: DG C-Arm 1-60 Min-No Report Result Date: 10/11/2023 Fluoroscopy was utilized by the requesting physician.  No radiographic interpretation.   DG HIP UNILAT WITH PELVIS 2-3 VIEWS LEFT Result Date: 10/11/2023 CLINICAL DATA:  Left inter trochanteric femur fracture fixation. EXAM: DG HIP (WITH OR WITHOUT PELVIS) 2-3V LEFT COMPARISON:  10/11/2023. FINDINGS: Three intraoperative fluoroscopic spot views of the left hip are submitted. 2 screws with an intramedullary rod and distal interlocking screw traverse a previously seen intertrochanteric fracture of the left femur. IMPRESSION: Intraoperative visualization of left intratrochanteric femur fracture fixation. Electronically Signed   By: Leanna Battles M.D.   On: 10/11/2023 15:47   Chest Portable 1 View Result Date: 10/11/2023 CLINICAL DATA:  Cough, preop.  Fall. EXAM: PORTABLE CHEST 1 VIEW COMPARISON:  10/01/2023 and CT chest 08/15/2021. Left shoulder 05/10/2023. FINDINGS: Trachea is midline. Heart size stable. Prominent ascending aorta, as before. Lungs are clear. No pleural fluid. Displaced and impacted old left humeral neck fracture, as before. IMPRESSION: 1. No acute findings. 2. Ascending thoracic aortic aneurysm, as reported on 08/15/2021. Consider nonemergent CT angiography of the chest in further evaluation, as clinically indicated. 3. Old left humeral neck fracture. Electronically Signed   By: Leanna Battles M.D.   On: 10/11/2023 10:36   DG FEMUR PORT MIN 2 VIEWS LEFT Result Date: 10/11/2023 CLINICAL DATA:  Fall, pain. EXAM: LEFT FEMUR PORTABLE 2 VIEWS COMPARISON:  None Available. FINDINGS: Osteopenia. Comminuted inter trochanteric fracture of left femur with 90 degrees varus angulation. Degenerative changes in the left knee, incidentally imaged. IMPRESSION: 1. Comminuted intertrochanteric left femur fracture with 90 degrees varus angulation. 2.  Osteopenia. 3. Left knee osteoarthritis. Electronically Signed   By: Leanna Battles M.D.   On: 10/11/2023 09:55   DG Hip Unilat W or Wo Pelvis 2-3 Views Left Result Date: 10/11/2023 CLINICAL DATA:  Left hip pain status post fall EXAM: DG HIP (WITH OR WITHOUT PELVIS) 3V LEFT COMPARISON:  Left hip radiograph dated 03/19/2013 FINDINGS: Status post right hip arthroplasty. Hardware appears intact and well seated. Mildly displaced ossific fragment along the right greater trochanter may be postsurgical. Comminuted fracture of the intertrochanteric left femur with varus angulation. No acute dislocation. IMPRESSION: 1. Comminuted fracture of the intertrochanteric left femur with varus angulation. 2. Status post right hip arthroplasty. Mildly displaced ossific fragment along the right greater trochanter may be postsurgical. Electronically Signed   By: Agustin Cree M.D.   On: 10/11/2023 08:38        Scheduled Meds:  acetaminophen  1,000 mg Oral Q6H   carbamazepine  400 mg Oral QHS   [START ON 10/17/2023] cyanocobalamin  1,000 mcg Intramuscular Weekly   docusate sodium  100 mg Oral BID   enoxaparin (LOVENOX) injection  40 mg Subcutaneous Q24H  folic acid  1 mg Oral Daily   lisinopril  20 mg Oral Daily   metoprolol tartrate  50 mg Oral BID   pantoprazole  40 mg Oral BID   QUEtiapine  12.5 mg Oral QHS   simvastatin  10 mg Oral QHS   Continuous Infusions:   LOS: 1 day    Time spent: 48 minutes spent on 10/12/2023 caring for this patient face-to-face including chart review, ordering labs/tests, documenting, discussion with nursing staff, consultants, updating family and interview/physical exam    Alvira Philips [place], DO [facility] Available via Epic secure chat 7am-7pm After these hours, please refer to coverage provider listed on [email or web address] 10/12/2023, 1:28 PM

## 2023-10-12 NOTE — Progress Notes (Signed)
   10/11/23 2000  Assess: MEWS Score  Temp 98.1 F (36.7 C)  BP (!) 76/51  MAP (mmHg) (!) 60  Pulse Rate 61  Resp 16  SpO2 98 %  O2 Device Room Air  Assess: MEWS Score  MEWS Temp 0  MEWS Systolic 2  MEWS Pulse 0  MEWS RR 0  MEWS LOC 0  MEWS Score 2  MEWS Score Color Yellow  Assess: if the MEWS score is Yellow or Red  Were vital signs accurate and taken at a resting state? Yes  Does the patient meet 2 or more of the SIRS criteria? No  MEWS guidelines implemented  Yes, yellow  Treat  MEWS Interventions Considered administering scheduled or prn medications/treatments as ordered  Take Vital Signs  Increase Vital Sign Frequency  Yellow: Q2hr x1, continue Q4hrs until patient remains green for 12hrs  Escalate  MEWS: Escalate Yellow: Discuss with charge nurse and consider notifying provider and/or RRT  Notify: Charge Nurse/RN  Name of Charge Nurse/RN Notified Mahima, RN  Provider Notification  Provider Name/Title Garner Nash, NP  Date Provider Notified 10/11/23  Time Provider Notified 2016  Method of Notification  (secure chat)  Notification Reason Other (Comment) (low bp)  Provider response See new orders  Date of Provider Response 10/11/23  Time of Provider Response 2019  Assess: SIRS CRITERIA  SIRS Temperature  0  SIRS Respirations  0  SIRS Pulse 0  SIRS WBC 0  SIRS Score Sum  0   Pt received bolus with little improvement in BP. Albumin was given as ordered,

## 2023-10-13 DIAGNOSIS — S72142A Displaced intertrochanteric fracture of left femur, initial encounter for closed fracture: Secondary | ICD-10-CM | POA: Diagnosis not present

## 2023-10-13 LAB — CBC
HCT: 22.7 % — ABNORMAL LOW (ref 36.0–46.0)
Hemoglobin: 7.8 g/dL — ABNORMAL LOW (ref 12.0–15.0)
MCH: 32.9 pg (ref 26.0–34.0)
MCHC: 34.4 g/dL (ref 30.0–36.0)
MCV: 95.8 fL (ref 80.0–100.0)
Platelets: 275 10*3/uL (ref 150–400)
RBC: 2.37 MIL/uL — ABNORMAL LOW (ref 3.87–5.11)
RDW: 12.3 % (ref 11.5–15.5)
WBC: 4.4 10*3/uL (ref 4.0–10.5)
nRBC: 0 % (ref 0.0–0.2)

## 2023-10-13 LAB — BASIC METABOLIC PANEL
Anion gap: 7 (ref 5–15)
BUN: 7 mg/dL — ABNORMAL LOW (ref 8–23)
CO2: 27 mmol/L (ref 22–32)
Calcium: 9.6 mg/dL (ref 8.9–10.3)
Chloride: 96 mmol/L — ABNORMAL LOW (ref 98–111)
Creatinine, Ser: 0.58 mg/dL (ref 0.44–1.00)
GFR, Estimated: >60 mL/min (ref >60)
Glucose, Bld: 112 mg/dL — ABNORMAL HIGH (ref 70–99)
Potassium: 3.8 mmol/L (ref 3.5–5.1)
Sodium: 130 mmol/L — ABNORMAL LOW (ref 135–145)

## 2023-10-13 LAB — VITAMIN B12: Vitamin B-12: 1431 pg/mL — ABNORMAL HIGH (ref 180–914)

## 2023-10-13 LAB — IRON AND TIBC
Iron: 19 ug/dL — ABNORMAL LOW (ref 28–170)
Saturation Ratios: 9 % — ABNORMAL LOW (ref 10.4–31.8)
TIBC: 202 ug/dL — ABNORMAL LOW (ref 250–450)
UIBC: 183 ug/dL

## 2023-10-13 LAB — RETICULOCYTES
Immature Retic Fract: 12.5 % (ref 2.3–15.9)
RBC.: 2.36 MIL/uL — ABNORMAL LOW (ref 3.87–5.11)
Retic Count, Absolute: 39.2 10*3/uL (ref 19.0–186.0)
Retic Ct Pct: 1.7 % (ref 0.4–3.1)

## 2023-10-13 LAB — FERRITIN: Ferritin: 294 ng/mL (ref 11–307)

## 2023-10-13 LAB — FOLATE: Folate: 14.8 ng/mL (ref >5.9)

## 2023-10-13 NOTE — Care Management Important Message (Signed)
 Important Message  Patient Details  Name: [name] MRN: [id number] Date of Birth: 22-Feb-1946   Important Message Given:  Yes - Medicare IM     Sherilyn Banker 10/13/2023, 11:44 AM

## 2023-10-13 NOTE — NC FL2 (Signed)
 [place] MEDICAID FL2 LEVEL OF CARE FORM     IDENTIFICATION  Patient Name: Emily Blair Birthdate: Nov 10, 1945 Sex: female Admission Date (Current Location): 10/11/2023  [place] and [place] Number:  Haynes Bast [id number] Facility and Address:  [facility], [address], [place], [place] [address]      Provider Number: [id number]  Attending Physician Name and Address:  [place], Eric J, DO  Relative Name and Phone Number:  Dorene Sorrow Sister   [phone]    Current Level of Care: Hospital Recommended Level of Care: Skilled Nursing Facility Prior Approval Number:    Date Approved/Denied:   PASRR Number: [id number]  Discharge Plan: SNF    Current Diagnoses: Patient Active Problem List   Diagnosis Date Noted   Malnutrition of moderate degree 10/12/2023   Bipolar 1 disorder (HCC) 10/11/2023   Hypokalemia/hypomagnesium 10/11/2023   Displaced intertrochanteric fracture of left femur, initial encounter for closed fracture (HCC) 10/11/2023   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   GERD (gastroesophageal reflux disease) 10/11/2023   B12 deficiency 10/01/2023   Paroxysmal atrial fibrillation (HCC) 10/01/2023   Essential hypertension 10/01/2023   Hyperlipidemia 10/01/2023   Normocytic anemia 10/01/2023   Altered mental status 09/27/2023   Acute respiratory failure (HCC) 09/26/2023   Meningitis 09/25/2023    Orientation RESPIRATION BLADDER Height & Weight     Self, Time, Situation, Place  Normal Incontinent Weight: 141 lb 1.5 oz (64 kg) Height:  5' 7.01" (170.2 cm)  BEHAVIORAL SYMPTOMS/MOOD NEUROLOGICAL BOWEL NUTRITION STATUS      Incontinent Diet (see discharge summary)  AMBULATORY STATUS COMMUNICATION OF NEEDS Skin   Total Care Verbally Surgical wounds                       Personal Care Assistance Level of Assistance  Bathing, Feeding, Dressing, Total care Bathing Assistance: Maximum assistance Feeding assistance: Limited  assistance Dressing Assistance: Maximum assistance Total Care Assistance: Maximum assistance   Functional Limitations Info  Sight, Hearing, Speech Sight Info: Adequate Hearing Info: Adequate Speech Info: Adequate    SPECIAL CARE FACTORS FREQUENCY  PT (By licensed PT), OT (By licensed OT)     PT Frequency: 5x week OT Frequency: 5x week            Contractures Contractures Info: Not present    Additional Factors Info  Code Status, Allergies Code Status Info: full Allergies Info: NKA           Current Medications (10/13/2023):  This is the current hospital active medication list Current Facility-Administered Medications  Medication Dose Route Frequency Provider Last Rate Last Admin   acetaminophen (TYLENOL) tablet 325-650 mg  325-650 mg Oral Q6H PRN Tarry Kos, MD   650 mg at 10/13/23 1237   alum & mag hydroxide-simeth (MAALOX/MYLANTA) 200-200-20 MG/5ML suspension 30 mL  30 mL Oral Q4H PRN Tarry Kos, MD       carbamazepine (TEGRETOL XR) 12 hr tablet 400 mg  400 mg Oral QHS Orland Mustard, MD   400 mg at 10/12/23 2230   [START ON 10/17/2023] cyanocobalamin (VITAMIN B12) injection 1,000 mcg  1,000 mcg Intramuscular Weekly Orland Mustard, MD       docusate sodium (COLACE) capsule 100 mg  100 mg Oral BID Tarry Kos, MD   100 mg at 10/13/23 0928   enoxaparin (LOVENOX) injection 40 mg  40 mg Subcutaneous Q24H Tarry Kos, MD   40 mg at 10/13/23 6237   feeding supplement (ENSURE ENLIVE /  ENSURE PLUS) liquid 237 mL  237 mL Oral BID BM [place], Eric J, DO   237 mL at 10/13/23 1238   folic acid (FOLVITE) tablet 1 mg  1 mg Oral Daily Orland Mustard, MD   1 mg at 10/13/23 1610   HYDROmorphone (DILAUDID) injection 0.5-1 mg  0.5-1 mg Intravenous Q4H PRN Tarry Kos, MD       levalbuterol Pauline Aus) nebulizer solution 0.63 mg  0.63 mg Nebulization Q6H PRN Orland Mustard, MD       lisinopril (ZESTRIL) tablet 20 mg  20 mg Oral Daily Tarry Kos, MD   20 mg at 10/13/23 9604    magnesium citrate solution 1 Bottle  1 Bottle Oral Once PRN Tarry Kos, MD       menthol-cetylpyridinium (CEPACOL) lozenge 3 mg  1 lozenge Oral PRN Tarry Kos, MD       Or   phenol (CHLORASEPTIC) mouth spray 1 spray  1 spray Mouth/Throat PRN Tarry Kos, MD       methocarbamol (ROBAXIN) tablet 500 mg  500 mg Oral Q6H PRN Tarry Kos, MD       Or   methocarbamol (ROBAXIN) injection 500 mg  500 mg Intravenous Q6H PRN Tarry Kos, MD       metoprolol tartrate (LOPRESSOR) tablet 50 mg  50 mg Oral BID Tarry Kos, MD   50 mg at 10/13/23 5409   multivitamin with minerals tablet 1 tablet  1 tablet Oral Daily [place], Eric J, DO   1 tablet at 10/13/23 0928   ondansetron (ZOFRAN) tablet 4 mg  4 mg Oral Q6H PRN Tarry Kos, MD       Or   ondansetron [facility]) injection 4 mg  4 mg Intravenous Q6H PRN Tarry Kos, MD       oxyCODONE (Oxy IR/ROXICODONE) immediate release tablet 10-15 mg  10-15 mg Oral Q4H PRN Tarry Kos, MD   10 mg at 10/12/23 2322   oxyCODONE (Oxy IR/ROXICODONE) immediate release tablet 5-10 mg  5-10 mg Oral Q4H PRN Tarry Kos, MD   10 mg at 10/13/23 1237   pantoprazole (PROTONIX) EC tablet 40 mg  40 mg Oral BID Orland Mustard, MD   40 mg at 10/13/23 8119   polyethylene glycol (MIRALAX / GLYCOLAX) packet 17 g  17 g Oral Daily PRN Tarry Kos, MD       QUEtiapine (SEROQUEL) tablet 12.5 mg  12.5 mg Oral QHS Tarry Kos, MD   12.5 mg at 10/12/23 2230   simvastatin (ZOCOR) tablet 10 mg  10 mg Oral QHS Orland Mustard, MD   10 mg at 10/12/23 2231   sorbitol 70 % solution 30 mL  30 mL Oral Daily PRN Tarry Kos, MD         Discharge Medications: Please see discharge summary for a list of discharge medications.  Relevant Imaging Results:  Relevant Lab Results:   Additional Information SSN: [id number]  Lorri Frederick, LCSW

## 2023-10-13 NOTE — Progress Notes (Signed)
 Subjective: 2 Days Post-Op Procedure(s) (LRB): FIXATION, FRACTURE, INTERTROCHANTERIC, WITH INTRAMEDULLARY ROD (Left) Patient reports pain as moderate.    Objective: Vital signs in last 24 hours: Temp:  [98.2 F (36.8 C)-98.9 F (37.2 C)] 98.9 F (37.2 C) (03/25 0738) Pulse Rate:  [60-80] 80 (03/25 0738) Resp:  [16-18] 16 (03/25 0500) BP: (107-127)/(68-84) 127/84 (03/25 0738) SpO2:  [98 %-100 %] 100 % (03/25 0738)  Intake/Output from previous day: 03/24 0701 - 03/25 0700 In: 600 [P.O.:600] Out: 250 [Urine:250] Intake/Output this shift: No intake/output data recorded.  Recent Labs    10/11/23 0808 10/12/23 0955 10/13/23 0619  HGB 10.9* 7.1* 7.8*   Recent Labs    10/12/23 0955 10/13/23 0619  WBC 4.4 4.4  RBC 2.16* 2.37*  2.36*  HCT 20.9* 22.7*  PLT 248 275   Recent Labs    10/12/23 0801 10/13/23 0619  NA 127* 130*  K 3.7 3.8  CL 94* 96*  CO2 23 27  BUN 9 7*  CREATININE 0.69 0.58  GLUCOSE 115* 112*  CALCIUM 9.1 9.6   No results for input(s): "LABPT", "INR" in the last 72 hours.  Neurologically intact Neurovascular intact Sensation intact distally Intact pulses distally Dorsiflexion/Plantar flexion intact Incision: dressing C/D/I No cellulitis present Compartment soft   Assessment/Plan: 2 Days Post-Op Procedure(s) (LRB): FIXATION, FRACTURE, INTERTROCHANTERIC, WITH INTRAMEDULLARY ROD (Left) Weightbearing: WBAT LLE Insicional and dressing care: Dressings left intact until follow-up Orthopedic device(s): None Showering: pod #3.  Keep bandage dry VTE prophylaxis: Lovenox 40mg  qd  x 2 weeks Pain control: oxycodone Follow - up plan: 2 weeks Contact information:  xu MD, Dub Mikes PA       Cristie Hem 10/13/2023, 7:50 AM

## 2023-10-13 NOTE — Progress Notes (Signed)
 PROGRESS NOTE    Emily Blair  YNW:[id number] DOB: [birth year] DOA: 10/11/2023 PCP: Maris Berger, MD    Brief Narrative:   Emily Blair is a 78 y.o. female with past medical history significant for HTN, HLD, newly diagnosed paroxysmal atrial fibrillation not anticoagulation due to fall risk, bipolar disorder, B12 deficiency presented to [facility] ED on 10/11/2023 from [facility] via EMS complaining of left hip pain following mechanical fall.  Patient reports fall attempting to go to the bathroom earlier in the morning.  Fell on her left side with immediate pain and able to get up.  Patient denies any fever/chills, no vision changes, no headaches, no chest pain, no palpitations, no shortness of breath, no abdominal pain, no nausea/vomiting/diarrhea, no urinary symptoms or lower extremity edema.  Denies alcohol or tobacco use.  Recent admission to [facility] from 3/7 - 3/15 for altered mental status/acute left-sided weakness requiring elective intubation for airway protection.  Workup essentially unrevealing but concern for seizure.  Had severe B12 deficiency and started on Tegretol.  Treated for aspiration pneumonia had paroxysmal atrial fibrillation that was new during this stay.  Anticoagulation deferred to frequent falls and was started on Lopressor 50 mg p.o. twice daily.  In the ED, temperature 98.3 F, HR 60, RR 16, BP 1 7/71, SpO2 100% on room air.  WBC 5.2, hemoglobin 10.9, platelet count 342.  Sodium 132, potassium 3.1, chloride 96, CO2 28, glucose 148, BUN 5, creatinine 0.55.  Vitamin D25 hydroxy 32.31.  Urinalysis with small leukocytes, no nitrite, many bacteria, 11-20 WBCs.  Left hip x-ray with comminuted fracture of the intertrochanteric left femur with varus angulation.  Orthopedics was consulted.  TRH consulted for admission for further evaluation management of left hip fracture.  Assessment & Plan:   Left hip intertrochanteric fracture Patient presenting to ED  with following mechanical fall.  Unable to ambulate with severe left hip pain.  Imaging on admission with comminuted fracture of the anterior Karrick left femur with varus angulation.  Orthopedics was consulted and patient underwent intramedullary implant by Dr. Roda Shutters on 10/11/2023.  Vitamin D 25-hydroxy level 32.31. -- Orthopedics following, appreciate assistance -- WBAT LLE -- Oxycodone 5 to 10 mg p.o. every 4 hours as needed moderate pain -- Oxycodone 10-50 mg p.o. every 4 hours as needed severe pain -- Dilaudid 0.5-1 mg IV every 4 hours as needed severe pain not relieved with oral medication -- Robaxin 5 mg p.o. every 6 hours as needed muscle spasms -- Lovenox for postoperative DVT prophylaxis x 2 weeks -- PT/OT evaluation: Recommend SNF placement, TOC consulted -- Outpatient follow-up with orthopedics in 2 weeks for staple removal  Acute postoperative blood loss anemia Anemia of chronic medical disease Anemia panel with iron 19, ferritin 294, TIBC 202, B12 1004 and 31, folate 14.8. -- Hgb 10.9>7.1>7.8 -- Repeat CBC in a.m. -- Transfuse for hemoglobin less than 7.0  Hypokalemia Repleted, potassium 3.8 this morning. -- Repeat BMP in am  Hyponatremia: Improving Etiology likely secondary to poor oral intake.  Supported with IV fluid hydration; now discontinued -- Na 132>127>130 -- Encourage increased oral intake -- BMP in the a.m.  Essential hypertension -- Metoprolol tartrate 50 g p.o. twice daily -- Lisinopril 10 mg p.o. daily  Hyperlipidemia -- Simvastatin 10 mg p.o. daily  Severe B12 deficiency -- Vitamin B12 1000 mcg IM weekly  Bipolar 1 disorder -- Tegretol 400 mg p.o. nightly -- Seroquel 12.5 mg p.o. nightly  Paroxysmal atrial fibrillation Recently  diagnosed at previous hospitalization.  Not on anticoagulation due to fall risk. -- Metoprolol tartrate 50 mg p.o. twice daily -- Outpatient follow-up with cardiology  GERD -- Protonix 40 minutes p.o. twice  daily   DVT prophylaxis: enoxaparin (LOVENOX) injection 40 mg Start: 10/12/23 0800 SCDs Start: 10/11/23 1438 Place TED hose Start: 10/11/23 1438 SCDs Start: 10/11/23 1008    Code Status: Full Code Family Communication: No family present at bedside this morning  Disposition Plan:  Level of care: Telemetry Medical Status is: Inpatient Remains inpatient appropriate because: Pending SNF placement, medically stable for discharge once bed available    Consultants:  Orthopedics, Dr. Roda Shutters  Procedures:  Open treatment of intertrochanteric, pertrochanteric, subtrochanteric fracture with intramedullary implant, Dr. Roda Shutters 10/11/2023   Antimicrobials:  Perioperative cefazolin   Subjective: Patient seen examined bedside, lying in bed.  No complaints this morning.  Awaiting SNF placement.  TOC consulted.  Denies headache, no visual changes, no chest pain, no palpitations, no shortness of breath, no abdominal pain, no fever/chills/night sweats, no nausea/vomiting/diarrhea, no focal weakness, no fatigue, no paresthesias.  No acute events overnight per nurse staff.  Objective: Vitals:   10/12/23 1322 10/12/23 2000 10/13/23 0500 10/13/23 0738  BP: 107/71 117/72 122/68 127/84  Pulse: 60 76 73 80  Resp: 16 16 16    Temp: 98.3 F (36.8 C) 98.2 F (36.8 C) 98.4 F (36.9 C) 98.9 F (37.2 C)  TempSrc: Oral Oral Oral Oral  SpO2: 100% 98% 99% 100%  Weight:      Height:        Intake/Output Summary (Last 24 hours) at 10/13/2023 1118 Last data filed at 10/12/2023 2300 Gross per 24 hour  Intake 480 ml  Output 250 ml  Net 230 ml   Filed Weights   10/11/23 0752 10/11/23 1133  Weight: 64 kg 64 kg    Examination:  Physical Exam: GEN: NAD, alert and oriented x 3, elderly appearance HEENT: NCAT, PERRL, EOMI, sclera clear, MMM PULM: CTAB w/o wheezes/crackles, normal respiratory effort, on room air CV: RRR w/o M/G/R GI: abd soft, NTND, NABS, no R/G/M MSK: no peripheral edema, moves all  presumedly, noted surgical incision site left hip with dressings in place, clean/dry/intact without surrounding erythema/fluctuance or ecchymosis NEURO: No focal neurological deficit PSYCH: normal mood/affect Integumentary: Left hip surgical incision site as above, otherwise no other concerning rashes/lesions/wounds noted on exposed skin surfaces.      Data Reviewed: I have personally reviewed following labs and imaging studies  CBC: Recent Labs  Lab 10/11/23 0808 10/12/23 0955 10/13/23 0619  WBC 5.2 4.4 4.4  HGB 10.9* 7.1* 7.8*  HCT 31.6* 20.9* 22.7*  MCV 95.2 96.8 95.8  PLT 342 248 275   Basic Metabolic Panel: Recent Labs  Lab 10/11/23 0808 10/11/23 0935 10/12/23 0801 10/13/23 0619  NA 132*  --  127* 130*  K 3.1*  --  3.7 3.8  CL 96*  --  94* 96*  CO2 28  --  23 27  GLUCOSE 148*  --  115* 112*  BUN 5*  --  9 7*  CREATININE 0.55  --  0.69 0.58  CALCIUM 9.7  --  9.1 9.6  MG  --  1.6*  --   --    GFR: Estimated Creatinine Clearance: 57.3 mL/min (by C-G formula based on SCr of 0.58 mg/dL). Liver Function Tests: No results for input(s): "AST", "ALT", "ALKPHOS", "BILITOT", "PROT", "ALBUMIN" in the last 168 hours. No results for input(s): "LIPASE", "AMYLASE" in the last 168  hours. No results for input(s): "AMMONIA" in the last 168 hours. Coagulation Profile: No results for input(s): "INR", "PROTIME" in the last 168 hours. Cardiac Enzymes: No results for input(s): "CKTOTAL", "CKMB", "CKMBINDEX", "TROPONINI" in the last 168 hours. BNP (last 3 results) No results for input(s): "PROBNP" in the last 8760 hours. HbA1C: No results for input(s): "HGBA1C" in the last 72 hours. CBG: No results for input(s): "GLUCAP" in the last 168 hours. Lipid Profile: No results for input(s): "CHOL", "HDL", "LDLCALC", "TRIG", "CHOLHDL", "LDLDIRECT" in the last 72 hours. Thyroid Function Tests: No results for input(s): "TSH", "T4TOTAL", "FREET4", "T3FREE", "THYROIDAB" in the last 72  hours. Anemia Panel: Recent Labs    10/13/23 0619  VITAMINB12 1,431*  FOLATE 14.8  FERRITIN 294  TIBC 202*  IRON 19*  RETICCTPCT 1.7   Sepsis Labs: No results for input(s): "PROCALCITON", "LATICACIDVEN" in the last 168 hours.  No results found for this or any previous visit (from the past 240 hours).       Radiology Studies: DG C-Arm 1-60 Min-No Report Result Date: 10/11/2023 Fluoroscopy was utilized by the requesting physician.  No radiographic interpretation.   DG HIP UNILAT WITH PELVIS 2-3 VIEWS LEFT Result Date: 10/11/2023 CLINICAL DATA:  Left inter trochanteric femur fracture fixation. EXAM: DG HIP (WITH OR WITHOUT PELVIS) 2-3V LEFT COMPARISON:  10/11/2023. FINDINGS: Three intraoperative fluoroscopic spot views of the left hip are submitted. 2 screws with an intramedullary rod and distal interlocking screw traverse a previously seen intertrochanteric fracture of the left femur. IMPRESSION: Intraoperative visualization of left intratrochanteric femur fracture fixation. Electronically Signed   By: Leanna Battles M.D.   On: 10/11/2023 15:47        Scheduled Meds:  carbamazepine  400 mg Oral QHS   [START ON 10/17/2023] cyanocobalamin  1,000 mcg Intramuscular Weekly   docusate sodium  100 mg Oral BID   enoxaparin (LOVENOX) injection  40 mg Subcutaneous Q24H   feeding supplement  237 mL Oral BID BM   folic acid  1 mg Oral Daily   lisinopril  20 mg Oral Daily   metoprolol tartrate  50 mg Oral BID   multivitamin with minerals  1 tablet Oral Daily   pantoprazole  40 mg Oral BID   QUEtiapine  12.5 mg Oral QHS   simvastatin  10 mg Oral QHS   Continuous Infusions:   LOS: 2 days    Time spent: 48 minutes spent on 10/13/2023 caring for this patient face-to-face including chart review, ordering labs/tests, documenting, discussion with nursing staff, consultants, updating family and interview/physical exam    Alvira Philips [place], DO [facility] Available via Epic  secure chat 7am-7pm After these hours, please refer to coverage provider listed on [email or web address] 10/13/2023, 11:18 AM

## 2023-10-13 NOTE — TOC Initial Note (Signed)
 Transition of Care [facility]) - Initial/Assessment Note    Patient Details  Name: Emily Blair MRN: [id number] Date of Birth: May 04, 1946  [facility] CM/SW Contact:    Lorri Frederick, LCSW Phone Number: 10/13/2023, 3:31 PM  Clinical Narrative:        Pt listed as oriented x4, able to talk with CSW but lethargic and seems confused.  Pt not able to state that she was admitted from [facility] rehab SNF but sister reports she has been there since last November.  Pt states that she lives alone, tells CSW to "not hurry", which appears to refer to her DC date.  Discussed PT recommendation for SNF but pt does not seem to grasp this.  Permission given to speak with sister Corrie Dandy.  CSW spoke with Corrie Dandy who reports pt has not been the same since their brother passed away not long ago.  Pt sometimes hostile towards [facility].  Discussed PT recommendation for SNF.          Referral sent to [facility].    1600: Message from [facility]: she is out of office today, will confirm tomorrow.    Expected Discharge Plan: Skilled Nursing Facility Barriers to Discharge: Continued Medical Work up, SNF Pending bed offer   Patient Goals and CMS Choice Patient states their goals for this hospitalization and ongoing recovery are:: unable to state a goal   Choice offered to / list presented to : Sibling (sister mary)      Expected Discharge Plan and Services In-house Referral: Clinical Social Work   Post Acute Care Choice: Skilled Nursing Facility Living arrangements for the past 2 months: [facility] Nursing [facility])                                      Prior Living Arrangements/Services Living arrangements for the past 2 months: Skilled Nursing [facility]) Lives with:: Facility Resident Patient language and need for interpreter reviewed:: Yes        Need for Family Participation in Patient Care: Yes (Comment) Care giver support system in place?:  Yes (comment) Current home services: Other (comment) (na) Criminal Activity/Legal Involvement Pertinent to Current Situation/Hospitalization: No - Comment as needed  Activities of Daily Living   ADL Screening (condition at time of admission) Independently performs ADLs?: No Does the patient have a NEW difficulty with bathing/dressing/toileting/self-feeding that is expected to last >3 days?: Yes (Initiates electronic notice to provider for possible OT consult) Does the patient have a NEW difficulty with getting in/out of bed, walking, or climbing stairs that is expected to last >3 days?: Yes (Initiates electronic notice to provider for possible PT consult) Does the patient have a NEW difficulty with communication that is expected to last >3 days?: No Is the patient deaf or have difficulty hearing?: No Does the patient have difficulty seeing, even when wearing glasses/contacts?: No Does the patient have difficulty concentrating, remembering, or making decisions?: No  Permission Sought/Granted Permission sought to share information with : Family Supports Permission granted to share information with : Yes, Verbal Permission Granted  Share Information with NAME: sister Corrie Dandy           Emotional Assessment Appearance:: Appears stated age Attitude/Demeanor/Rapport: Engaged, Lethargic Affect (typically observed): Appropriate Orientation: : Oriented to Self, Oriented to Place, Oriented to  Time, Oriented to Situation      Admission diagnosis:  Intertrochanteric fracture of  left femur (HCC) [S72.142A] Closed 2-part intertrochanteric fracture of left femur, initial encounter [facility]) [S72.142A] Patient Active Problem List   Diagnosis Date Noted   Malnutrition of moderate degree 10/12/2023   Bipolar 1 disorder (HCC) 10/11/2023   Hypokalemia/hypomagnesium 10/11/2023   Displaced intertrochanteric fracture of left femur, initial encounter for closed fracture (HCC) 10/11/2023   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   GERD (gastroesophageal reflux disease) 10/11/2023   B12 deficiency 10/01/2023   Paroxysmal atrial fibrillation (HCC) 10/01/2023   Essential hypertension 10/01/2023   Hyperlipidemia 10/01/2023   Normocytic anemia 10/01/2023   Altered mental status 09/27/2023   Acute respiratory failure (HCC) 09/26/2023   Meningitis 09/25/2023   PCP:  Maris Berger, MD Pharmacy:   [facility] [phone] Rosalita Levan, [place] - [address] AT [facility] [address] [place] [address] Phone: [phone] Fax: [phone]     Social Drivers of Health (SDOH) Social History: SDOH Screenings   Food Insecurity: No Food Insecurity (10/11/2023)  Housing: Low Risk  (10/11/2023)  Transportation Needs: No Transportation Needs (10/11/2023)  Utilities: Not At Risk (10/11/2023)  Social Connections: Unknown (10/11/2023)  Tobacco Use: Unknown (10/11/2023)   SDOH Interventions:     Readmission Risk Interventions     No data to display

## 2023-10-13 NOTE — Plan of Care (Signed)
 Problem: Education: Goal: Knowledge of General Education information will improve Description: Including pain rating scale, medication(s)/side effects and non-pharmacologic comfort measures 10/13/2023 2032 by Sampson Goon, RN Outcome: Progressing 10/13/2023 1944 by Sampson Goon, RN Outcome: Progressing   Problem: Health Behavior/Discharge Planning: Goal: Ability to manage health-related needs will improve 10/13/2023 2032 by Sampson Goon, RN Outcome: Progressing 10/13/2023 1944 by Sampson Goon, RN Outcome: Progressing   Problem: Clinical Measurements: Goal: Ability to maintain clinical measurements within normal limits will improve 10/13/2023 2032 by Sampson Goon, RN Outcome: Progressing 10/13/2023 1944 by Sampson Goon, RN Outcome: Progressing Goal: Will remain free from infection 10/13/2023 2032 by Sampson Goon, RN Outcome: Progressing 10/13/2023 1944 by Sampson Goon, RN Outcome: Progressing Goal: Diagnostic test results will improve 10/13/2023 2032 by Sampson Goon, RN Outcome: Progressing 10/13/2023 1944 by Sampson Goon, RN Outcome: Progressing Goal: Respiratory complications will improve 10/13/2023 2032 by Sampson Goon, RN Outcome: Progressing 10/13/2023 1944 by Sampson Goon, RN Outcome: Progressing Goal: Cardiovascular complication will be avoided 10/13/2023 2032 by Sampson Goon, RN Outcome: Progressing 10/13/2023 1944 by Sampson Goon, RN Outcome: Progressing   Problem: Activity: Goal: Risk for activity intolerance will decrease 10/13/2023 2032 by Sampson Goon, RN Outcome: Progressing 10/13/2023 1944 by Sampson Goon, RN Outcome: Progressing   Problem: Nutrition: Goal: Adequate nutrition will be maintained 10/13/2023 2032 by Sampson Goon, RN Outcome: Progressing 10/13/2023 1944 by Sampson Goon, RN Outcome: Progressing   Problem: Coping: Goal: Level of anxiety will decrease 10/13/2023  2032 by Sampson Goon, RN Outcome: Progressing 10/13/2023 1944 by Sampson Goon, RN Outcome: Progressing   Problem: Elimination: Goal: Will not experience complications related to bowel motility 10/13/2023 2032 by Sampson Goon, RN Outcome: Progressing 10/13/2023 1944 by Sampson Goon, RN Outcome: Progressing Goal: Will not experience complications related to urinary retention 10/13/2023 2032 by Sampson Goon, RN Outcome: Progressing 10/13/2023 1944 by Sampson Goon, RN Outcome: Progressing   Problem: Pain Managment: Goal: General experience of comfort will improve and/or be controlled 10/13/2023 2032 by Sampson Goon, RN Outcome: Progressing 10/13/2023 1944 by Sampson Goon, RN Outcome: Progressing   Problem: Safety: Goal: Ability to remain free from injury will improve 10/13/2023 2032 by Sampson Goon, RN Outcome: Progressing 10/13/2023 1944 by Sampson Goon, RN Outcome: Progressing   Problem: Skin Integrity: Goal: Risk for impaired skin integrity will decrease 10/13/2023 2032 by Sampson Goon, RN Outcome: Progressing 10/13/2023 1944 by Sampson Goon, RN Outcome: Progressing   Problem: Education: Goal: Verbalization of understanding the information provided (i.e., activity precautions, restrictions, etc) will improve 10/13/2023 2032 by Sampson Goon, RN Outcome: Progressing 10/13/2023 1944 by Sampson Goon, RN Outcome: Progressing Goal: Individualized Educational Video(s) 10/13/2023 2032 by Sampson Goon, RN Outcome: Progressing 10/13/2023 1944 by Sampson Goon, RN Outcome: Progressing   Problem: Activity: Goal: Ability to ambulate and perform ADLs will improve 10/13/2023 2032 by Sampson Goon, RN Outcome: Progressing 10/13/2023 1944 by Sampson Goon, RN Outcome: Progressing   Problem: Clinical Measurements: Goal: Postoperative complications will be avoided or minimized 10/13/2023 2032 by Sampson Goon, RN Outcome: Progressing 10/13/2023 1944 by Sampson Goon, RN Outcome: Progressing   Problem: Self-Concept: Goal: Ability to maintain and perform role responsibilities to the fullest extent possible will improve 10/13/2023 2032 by Sampson Goon, RN Outcome: Progressing 10/13/2023 1944 by Sampson Goon, RN Outcome: Progressing   Problem:  Pain Management: Goal: Pain level will decrease 10/13/2023 2032 by Sampson Goon, RN Outcome: Progressing 10/13/2023 1944 by Sampson Goon, RN Outcome: Progressing

## 2023-10-14 DIAGNOSIS — S72142A Displaced intertrochanteric fracture of left femur, initial encounter for closed fracture: Secondary | ICD-10-CM | POA: Diagnosis not present

## 2023-10-14 LAB — PREPARE RBC (CROSSMATCH)

## 2023-10-14 LAB — CBC
HCT: 20.6 % — ABNORMAL LOW (ref 36.0–46.0)
Hemoglobin: 7 g/dL — ABNORMAL LOW (ref 12.0–15.0)
MCH: 32.3 pg (ref 26.0–34.0)
MCHC: 34 g/dL (ref 30.0–36.0)
MCV: 94.9 fL (ref 80.0–100.0)
Platelets: 255 10*3/uL (ref 150–400)
RBC: 2.17 MIL/uL — ABNORMAL LOW (ref 3.87–5.11)
RDW: 12.4 % (ref 11.5–15.5)
WBC: 4.9 10*3/uL (ref 4.0–10.5)
nRBC: 0 % (ref 0.0–0.2)

## 2023-10-14 LAB — BASIC METABOLIC PANEL
Anion gap: 8 (ref 5–15)
BUN: 9 mg/dL (ref 8–23)
CO2: 26 mmol/L (ref 22–32)
Calcium: 9.6 mg/dL (ref 8.9–10.3)
Chloride: 93 mmol/L — ABNORMAL LOW (ref 98–111)
Creatinine, Ser: 0.54 mg/dL (ref 0.44–1.00)
GFR, Estimated: >60 mL/min (ref >60)
Glucose, Bld: 101 mg/dL — ABNORMAL HIGH (ref 70–99)
Potassium: 3.9 mmol/L (ref 3.5–5.1)
Sodium: 127 mmol/L — ABNORMAL LOW (ref 135–145)

## 2023-10-14 LAB — ABO/RH: ABO/RH(D): O POS

## 2023-10-14 MED ORDER — SODIUM CHLORIDE 0.9% IV SOLUTION: Freq: Once | INTRAVENOUS | Status: AC

## 2023-10-14 NOTE — Progress Notes (Signed)
 PROGRESS NOTE    Emily Blair  ZOX:[id number] DOB: Dec 18, 1945 DOA: 10/11/2023 PCP: Maris Berger, MD    Brief Narrative:  78 year old with history of hypertension, hyperlipidemia, newly diagnosed paroxysmal A-fib not anticoagulated due to fall risk, bipolar disorder, B12 essentially presented to [facility] from [facility] with complaints of left hip pain following a mechanical fall.  She was attempting to go to bathroom and fell on the left side.  Recent admission 3/7-3/15 for altered mental status, concern for seizure.  Found to have B12 deficiency and also treated with Tegretol. In the emergency normotensive, on room air.  Left hip with comminuted fracture intertrochanteric left femur.  Orthopedic consulted and admitted to the hospital.  Subjective: Patient seen and examined.  Flat affect.  Patient herself denies any complaint to me.  No other overnight events.  Hemoglobin is 7.  Patient consented for blood transfusion.  She is steadily dropping her hemoglobin and will benefit with keeping hemoglobin more than 8. Assessment & Plan:   Closed traumatic left intertrochanteric fracture of the hip: Status post ORIF with IM nail 3/23, Dr. Roda Shutters Weightbearing as tolerated Pain management with oxycodone, Tylenol and Robaxin.  Bowel regimen. DVT prophylaxis Lovenox for 2 weeks. Report back to SNF. Ortho to schedule follow-up for staple removal and recheck.  Acute postoperative blood loss anemia, acute on chronic anemia medical disease: Iron 19, ferritin 294, TIBC 202, B12 1004, folic acid normal. Hemoglobin 10.9-7.1-7. Given patient's multiple medical issues, steadily dropping hemoglobin she will benefit with keeping hemoglobin more than 7.  Will transfuse 1 unit.  Patient consented.  Hypokalemia: Replaced.  Hypovolemic hyponatremia: Persistently low.  Blood transfusion for volume expansion today.  Recheck tomorrow morning.  Patient is hypertension blood pressure  stable.  On lisinopril and metoprolol.  B12 deficiency: Patient on B12 1000 mcg intramuscular every week.  Bipolar disorder: On Tegretol and Seroquel that is continued.  Paroxysmal A-fib: Currently in sinus rhythm.  Rate controlled on metoprolol.  Can be transferred back to skilled nursing facility after blood transfusion.   DVT prophylaxis: enoxaparin (LOVENOX) injection 40 mg Start: 10/12/23 0800 SCDs Start: 10/11/23 1438 Place TED hose Start: 10/11/23 1438 SCDs Start: 10/11/23 1008   Code Status: Full code Family Communication: None at the bedside Disposition Plan: Status is: Inpatient Remains inpatient appropriate because: Blood transfusion, needs SNF.     Consultants:  Orthopedics  Procedures:  ORIF left hip  Antimicrobials:  None     Objective: Vitals:   10/14/23 0500 10/14/23 0500 10/14/23 0705 10/14/23 1038  BP:  137/69 128/78 (!) 103/57  Pulse:  69 81 81  Resp:  16    Temp:  97.9 F (36.6 C) 98.3 F (36.8 C)   TempSrc:  Oral Oral   SpO2:  100% 97% 100%  Weight: 64 kg     Height:        Intake/Output Summary (Last 24 hours) at 10/14/2023 1318 Last data filed at 10/13/2023 1900 Gross per 24 hour  Intake 120 ml  Output 400 ml  Net -280 ml   Filed Weights   10/11/23 1133 10/14/23 0352 10/14/23 0500  Weight: 64 kg 64.2 kg 64 kg    Examination:  General exam: Appears calm and comfortable.  Frail and debilitated.  Chronically sick looking. Respiratory system: No added sounds. Cardiovascular system: S1 & S2 heard, RRR. No pedal edema. Gastrointestinal system: Soft.  Nontender. Central nervous system: Alert, awake and mostly oriented.  She has flat affect.  No  focal neurological deficits. Extremities: Symmetric 5 x 5 power. Skin: Left lateral thigh incision clean and dry.  Staples intact.    Data Reviewed: I have personally reviewed following labs and imaging studies  CBC: Recent Labs  Lab 10/11/23 0808 10/12/23 0955 10/13/23 0619  10/14/23 0557  WBC 5.2 4.4 4.4 4.9  HGB 10.9* 7.1* 7.8* 7.0*  HCT 31.6* 20.9* 22.7* 20.6*  MCV 95.2 96.8 95.8 94.9  PLT 342 248 275 255   Basic Metabolic Panel: Recent Labs  Lab 10/11/23 0808 10/11/23 0935 10/12/23 0801 10/13/23 0619 10/14/23 0557  NA 132*  --  127* 130* 127*  K 3.1*  --  3.7 3.8 3.9  CL 96*  --  94* 96* 93*  CO2 28  --  23 27 26   GLUCOSE 148*  --  115* 112* 101*  BUN 5*  --  9 7* 9  CREATININE 0.55  --  0.69 0.58 0.54  CALCIUM 9.7  --  9.1 9.6 9.6  MG  --  1.6*  --   --   --    GFR: Estimated Creatinine Clearance: 57.3 mL/min (by C-G formula based on SCr of 0.54 mg/dL). Liver Function Tests: No results for input(s): "AST", "ALT", "ALKPHOS", "BILITOT", "PROT", "ALBUMIN" in the last 168 hours. No results for input(s): "LIPASE", "AMYLASE" in the last 168 hours. No results for input(s): "AMMONIA" in the last 168 hours. Coagulation Profile: No results for input(s): "INR", "PROTIME" in the last 168 hours. Cardiac Enzymes: No results for input(s): "CKTOTAL", "CKMB", "CKMBINDEX", "TROPONINI" in the last 168 hours. BNP (last 3 results) No results for input(s): "PROBNP" in the last 8760 hours. HbA1C: No results for input(s): "HGBA1C" in the last 72 hours. CBG: No results for input(s): "GLUCAP" in the last 168 hours. Lipid Profile: No results for input(s): "CHOL", "HDL", "LDLCALC", "TRIG", "CHOLHDL", "LDLDIRECT" in the last 72 hours. Thyroid Function Tests: No results for input(s): "TSH", "T4TOTAL", "FREET4", "T3FREE", "THYROIDAB" in the last 72 hours. Anemia Panel: Recent Labs    10/13/23 0619  VITAMINB12 1,431*  FOLATE 14.8  FERRITIN 294  TIBC 202*  IRON 19*  RETICCTPCT 1.7   Sepsis Labs: No results for input(s): "PROCALCITON", "LATICACIDVEN" in the last 168 hours.  No results found for this or any previous visit (from the past 240 hours).       Radiology Studies: No results found.      Scheduled Meds:  sodium chloride   Intravenous  Once   carbamazepine  400 mg Oral QHS   [START ON 10/17/2023] cyanocobalamin  1,000 mcg Intramuscular Weekly   docusate sodium  100 mg Oral BID   enoxaparin (LOVENOX) injection  40 mg Subcutaneous Q24H   feeding supplement  237 mL Oral BID BM   folic acid  1 mg Oral Daily   lisinopril  20 mg Oral Daily   metoprolol tartrate  50 mg Oral BID   multivitamin with minerals  1 tablet Oral Daily   pantoprazole  40 mg Oral BID   QUEtiapine  12.5 mg Oral QHS   simvastatin  10 mg Oral QHS   Continuous Infusions:   LOS: 3 days    Time spent: 40 minutes    Dorcas Carrow, MD [facility]

## 2023-10-14 NOTE — Progress Notes (Signed)
 Physical Therapy Treatment Patient Details Name: Emily Blair MRN: [id number] DOB: 08-14-1945 Today's Date: 10/14/2023   History of Present Illness Pt is a 78 y.o. female admitted to [facility] from [facility] with a fall. X-ray showed L femur fx. Fixation with intramedullary rod performed 3/23. L WBAT  Recently dc from [facility] 3/15 after suspected unwitness seizure. PMH - dementia, arthritis, bipolar, htn, humerus fx, osteoporosis    PT Comments  Continuing work on functional mobility and activity tolerance; session focused function transfers; pt requried 2+ max assistance and heavy use of bed pad to get EOB; was able to sit EOB for 5-8 minutes with initial posterior lean; pt was fidgeting and reporting pain; was able to sit without UE her own support for short durations to scratch itches concentrated at L hip; physical assist to sit EOB ranged from brief moments of contact guard assist to max assist;   Opted to use stedy for sit to stand training to provide anterior target to hold onto, encouraging anterior weight shift; first attempt to stand from EOB was unsuccessful, after PT readjustment was able to stand with 2+ max assist to higher stedy seat; pt continued to report pain and insisted on returning to bed; performed 3 stands from higher stedy seat with 2+ min assist; considered helping pt transfer to recliner but ultimately returned to bed per pt's request, and for safety as pt showed increased restlessness towards the end of session;  Pt showed modest progress compared to previous session, with better sitting balance with less posterior lean, able to get to standing with use of stedy; Patient will benefit from continued inpatient follow up therapy, <3 hours/day      If plan is discharge home, recommend the following: A lot of help with bathing/dressing/bathroom;Direct supervision/assist for medications management;Assist for transportation;Two people to help with walking and/or  transfers;Help with stairs or ramp for entrance;Supervision due to cognitive status   Can travel by private vehicle     No  Equipment Recommendations  Wheelchair (measurements PT);Wheelchair cushion (measurements PT)    Recommendations for Other Services       Precautions / Restrictions Precautions Precautions: Fall Recall of Precautions/Restrictions: Impaired Restrictions LLE Weight Bearing Per Provider Order: Weight bearing as tolerated     Mobility  Bed Mobility Overal bed mobility: Needs Assistance Bed Mobility: Supine to Sit     Supine to sit: +2 for physical assistance, Max assist     General bed mobility comments: Assist for LE advancement towards EOB as well as for trunk elevation to full sitting position. Bed pad utilzied to complete transition to EOB and get feet on the floor.    Transfers Overall transfer level: Needs assistance Equipment used: Ambulation equipment used Transfers: Sit to/from Stand Sit to Stand: +2 physical assistance, Max assist           General transfer comment: Opted to use teh stedy in hopes of getting more anterior weight shift with stedy bar in front of pt; first attempt without success; abel to stand on second attempt with +2 max assist; stood 2-3 more times from higher stedy seat    Ambulation/Gait               General Gait Details: Unable to progress to gait training at this time.   Stairs             Wheelchair Mobility     Tilt Bed    Modified Rankin (Stroke Patients Only)  Balance     Sitting balance-Leahy Scale: Poor       Standing balance-Leahy Scale: Poor Standing balance comment: +2 assist required                            Communication Communication Communication: Impaired Factors Affecting Communication: Difficulty expressing self  Cognition Arousal: Alert Behavior During Therapy: Restless, Anxious   PT - Cognitive impairments: History of cognitive impairments                          Following commands: Impaired Following commands impaired: Follows one step commands inconsistently, Follows one step commands with increased time    Cueing Cueing Techniques: Verbal cues, Gestural cues, Tactile cues  Exercises      General Comments General comments (skin integrity, edema, etc.): NAD on room air      Pertinent Vitals/Pain Pain Assessment Pain Assessment: Faces Faces Pain Scale: Hurts whole lot Pain Location: L leg Pain Descriptors / Indicators: Grimacing, Moaning, Operative site guarding, Sore Pain Intervention(s): Monitored during session    Home Living                          Prior Function            PT Goals (current goals can now be found in the care plan section) Acute Rehab PT Goals Patient Stated Goal: unable to state PT Goal Formulation: Patient unable to participate in goal setting Time For Goal Achievement: 10/26/23 Potential to Achieve Goals: Fair Progress towards PT goals: Progressing toward goals    Frequency    Min 1X/week      PT Plan      Co-evaluation              AM-PAC PT "6 Clicks" Mobility   Outcome Measure  Help needed turning from your back to your side while in a flat bed without using bedrails?: A Lot Help needed moving from lying on your back to sitting on the side of a flat bed without using bedrails?: A Lot Help needed moving to and from a bed to a chair (including a wheelchair)?: A Lot Help needed standing up from a chair using your arms (e.g., wheelchair or bedside chair)?: Total Help needed to walk in hospital room?: Total Help needed climbing 3-5 steps with a railing? : Total 6 Click Score: 9    End of Session Equipment Utilized During Treatment: Gait belt Activity Tolerance: Patient tolerated treatment well Patient left: in bed;with call bell/phone within reach;with bed alarm set Nurse Communication: Mobility status PT Visit Diagnosis: Unsteadiness on  feet (R26.81);Other abnormalities of gait and mobility (R26.89);Muscle weakness (generalized) (M62.81);Difficulty in walking, not elsewhere classified (R26.2);Other symptoms and signs involving the nervous system (R29.898)     Time: 1140-1159 PT Time Calculation (min) (ACUTE ONLY): 19 min  Charges:    $Therapeutic Activity: 8-22 mins PT General Charges $$ ACUTE PT VISIT: 1 Visit                     Van Clines, PT  Acute Rehabilitation Services Office [phone] Secure Chat welcomed    Emily Blair 10/14/2023, 5:38 PM

## 2023-10-14 NOTE — TOC Progression Note (Addendum)
 Transition of Care [facility]) - Progression Note    Patient Details  Name: Emily Blair MRN: [id number] Date of Birth: February 21, 1946  [facility]) CM/SW Contact  Lorri Frederick, LCSW Phone Number: 10/14/2023, 11:00 AM  Clinical Narrative:   CSW message with [facility] confirmed pt can return today. They do need new insurance auth.    Auth request submitted in [place].    1330: Auth still pending in [place].  Expected Discharge Plan: Skilled Nursing Facility Barriers to Discharge: Continued Medical Work up, SNF Pending bed offer  Expected Discharge Plan and Services In-house Referral: Clinical Social Work   Post Acute Care Choice: Skilled Nursing Facility Living arrangements for the past 2 months: [facility])                                       Social Determinants of Health (SDOH) Interventions SDOH Screenings   Food Insecurity: No Food Insecurity (10/11/2023)  Housing: Low Risk  (10/11/2023)  Transportation Needs: No Transportation Needs (10/11/2023)  Utilities: Not At Risk (10/11/2023)  Social Connections: Unknown (10/11/2023)  Tobacco Use: Unknown (10/11/2023)    Readmission Risk Interventions     No data to display

## 2023-10-14 NOTE — Plan of Care (Signed)
   Problem: Activity: Goal: Risk for activity intolerance will decrease Outcome: Progressing   Problem: Nutrition: Goal: Adequate nutrition will be maintained Outcome: Progressing   Problem: Coping: Goal: Level of anxiety will decrease Outcome: Progressing

## 2023-10-14 NOTE — Plan of Care (Signed)

## 2023-10-15 DIAGNOSIS — S72142A Displaced intertrochanteric fracture of left femur, initial encounter for closed fracture: Secondary | ICD-10-CM | POA: Diagnosis not present

## 2023-10-15 LAB — COMPREHENSIVE METABOLIC PANEL WITH GFR
ALT: 6 U/L (ref 0–44)
AST: 16 U/L (ref 15–41)
Albumin: 2.5 g/dL — ABNORMAL LOW (ref 3.5–5.0)
Alkaline Phosphatase: 105 U/L (ref 38–126)
Anion gap: 7 (ref 5–15)
BUN: 9 mg/dL (ref 8–23)
CO2: 28 mmol/L (ref 22–32)
Calcium: 9.7 mg/dL (ref 8.9–10.3)
Chloride: 93 mmol/L — ABNORMAL LOW (ref 98–111)
Creatinine, Ser: 0.6 mg/dL (ref 0.44–1.00)
GFR, Estimated: >60 mL/min (ref >60)
Glucose, Bld: 115 mg/dL — ABNORMAL HIGH (ref 70–99)
Potassium: 4.1 mmol/L (ref 3.5–5.1)
Sodium: 128 mmol/L — ABNORMAL LOW (ref 135–145)
Total Bilirubin: 0.6 mg/dL (ref 0.0–1.2)
Total Protein: 5.6 g/dL — ABNORMAL LOW (ref 6.5–8.1)

## 2023-10-15 LAB — TYPE AND SCREEN
ABO/RH(D): O POS
Antibody Screen: NEGATIVE
Unit division: 0

## 2023-10-15 LAB — BPAM RBC
Blood Product Expiration Date: 202504262359
ISSUE DATE / TIME: 202503261330
Unit Type and Rh: 5100

## 2023-10-15 LAB — CBC WITH DIFFERENTIAL/PLATELET
Abs Immature Granulocytes: 0.05 10*3/uL (ref 0.00–0.07)
Basophils Absolute: 0 10*3/uL (ref 0.0–0.1)
Basophils Relative: 0 %
Eosinophils Absolute: 0.1 10*3/uL (ref 0.0–0.5)
Eosinophils Relative: 1 %
HCT: 26.2 % — ABNORMAL LOW (ref 36.0–46.0)
Hemoglobin: 9.1 g/dL — ABNORMAL LOW (ref 12.0–15.0)
Immature Granulocytes: 1 %
Lymphocytes Relative: 31 %
Lymphs Abs: 1.5 10*3/uL (ref 0.7–4.0)
MCH: 32 pg (ref 26.0–34.0)
MCHC: 34.7 g/dL (ref 30.0–36.0)
MCV: 92.3 fL (ref 80.0–100.0)
Monocytes Absolute: 0.5 10*3/uL (ref 0.1–1.0)
Monocytes Relative: 10 %
Neutro Abs: 2.8 10*3/uL (ref 1.7–7.7)
Neutrophils Relative %: 57 %
Platelets: 292 10*3/uL (ref 150–400)
RBC: 2.84 MIL/uL — ABNORMAL LOW (ref 3.87–5.11)
RDW: 13.5 % (ref 11.5–15.5)
WBC: 5 10*3/uL (ref 4.0–10.5)
nRBC: 0 % (ref 0.0–0.2)

## 2023-10-15 MED ORDER — CYANOCOBALAMIN 1000 MCG/ML IJ SOLN: INTRAMUSCULAR | Status: AC

## 2023-10-15 MED ORDER — IRON (FERROUS SULFATE) 325 (65 FE) MG PO TABS: 325.0000 mg | ORAL_TABLET | Freq: Every day | ORAL | Status: AC

## 2023-10-15 MED ORDER — METHOCARBAMOL 500 MG PO TABS: 500.0000 mg | ORAL_TABLET | Freq: Four times a day (QID) | ORAL | Status: AC | PRN

## 2023-10-15 NOTE — TOC Progression Note (Signed)
 Transition of Care [facility]) - Progression Note    Patient Details  Name: Emily Blair MRN: [id number] Date of Birth: 06-19-1946  Transition of Care [facility]) CM/SW Contact  Lorri Frederick, LCSW Phone Number: 10/15/2023, 8:38 AM  Clinical Narrative:   SNF auth approved in [place]: [id number], 3 days: 3/26-3/28.    [facility] confirms they can receive pt today.  MD informed.    Expected Discharge Plan: Skilled Nursing Facility Barriers to Discharge: Continued Medical Work up, SNF Pending bed offer  Expected Discharge Plan and Services In-house Referral: Clinical Social Work   Post Acute Care Choice: Skilled Nursing Facility Living arrangements for the past 2 months: [facility] Nursing [facility])                                       Social Determinants of Health (SDOH) Interventions SDOH Screenings   Food Insecurity: No Food Insecurity (10/11/2023)  Housing: Low Risk  (10/11/2023)  Transportation Needs: No Transportation Needs (10/11/2023)  Utilities: Not At Risk (10/11/2023)  Social Connections: Unknown (10/11/2023)  Tobacco Use: Unknown (10/11/2023)    Readmission Risk Interventions     No data to display

## 2023-10-15 NOTE — Progress Notes (Signed)
 Report given to brandi at [facility]

## 2023-10-15 NOTE — Discharge Summary (Signed)
 Physician Discharge Summary  Emily Blair:[id number] DOB: 08/10/1945 DOA: 10/11/2023  PCP: Maris Berger, MD  Admit date: 10/11/2023 Discharge date: 10/15/2023  Admitted From: Skilled nursing facility Disposition: Skilled nursing facility  Recommendations for Outpatient Follow-up:  Follow up with PCP in 1-2 weeks Please obtain BMP/CBC in one week Orthopedics to schedule follow-up  Home Health: N/A Equipment/Devices: N/A  Discharge Condition: Stable CODE STATUS: Full code Diet recommendation: Low-salt diet, water restriction 1500 mL in 24 hours.  Discharge summary: 78 year old with history of hypertension, hyperlipidemia, newly diagnosed paroxysmal A-fib not anticoagulated due to fall risk, bipolar disorder, B12 deficiency presented to [facility] from [facility] with complaints of left hip pain following a mechanical fall.  She was attempting to go to bathroom and fell on the left side.  Recent admission 3/7-3/15 for altered mental status, concern for seizure.  Found to have B12 deficiency and also treated with Tegretol. In the emergency normotensive, on room air.  Left hip with comminuted fracture intertrochanteric left femur.  Orthopedic consulted and admitted to the hospital.  Patient ultimately underwent ORIF.  Medically stabilized.  Stable to transfer back to a SNF today.  Assessment & Plan:   Closed traumatic left intertrochanteric fracture of the hip: Status post ORIF with IM nail 3/23, Dr. Roda Shutters Weightbearing as tolerated Pain management with oxycodone, Tylenol and Robaxin.  Bowel regimen. DVT prophylaxis Lovenox for 2 weeks. Therapy at SNF. Ortho to schedule follow-up for staple removal and recheck.   Acute postoperative blood loss anemia, acute on chronic anemia medical disease: Iron 19, ferritin 294, TIBC 202, B12 1004, folic acid normal. Hemoglobin 10.9-7.1-7-1 unit PRBC transfusion, hemoglobin is 9.  Currently does not have any evidence of  ongoing bleeding.  Recheck in 1 week. Will prescribe oral iron therapy.   Hypokalemia: Replaced and adequate.   Hypovolemic hyponatremia: Persistently low.  128 today.  Some improvement with volume expansion.  Patient with compulsive water drinking.  Restrict free water drinking 1500 mL in 24 hours.  Recheck levels in 1 week.    Essential hypertension blood pressure stable.  On lisinopril and metoprolol.   B12 deficiency: Patient on B12 1000 mcg intramuscular every week for 4 more doses then every month.   Bipolar disorder: On Tegretol and Seroquel that is continued.   Paroxysmal A-fib: Currently in sinus rhythm.  Rate controlled on metoprolol.   Medically stable for transfer back to SNF for rehab.    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   Essential hypertension   Hyperlipidemia   Bipolar 1 disorder (HCC)   B12 deficiency   GERD (gastroesophageal reflux disease)   Malnutrition of moderate degree    Discharge Instructions  Discharge Instructions     Call MD for:  redness, tenderness, or signs of infection (pain, swelling, redness, odor or green/yellow discharge around incision site)   Complete by: As directed    Call MD for:  severe uncontrolled pain   Complete by: As directed    Diet - low sodium heart healthy   Complete by: As directed    Free water restriction 1500 ml in 24 hours   Increase activity slowly   Complete by: As directed       Allergies as of 10/15/2023   No Known Allergies  Medication List     TAKE these medications    acetaminophen 650 MG CR tablet Commonly known as: TYLENOL Take 650 mg by mouth every 8 (eight) hours as needed for pain.   carbamazepine 400 MG 12 hr tablet Commonly known as: TEGRETOL  XR Take 400 mg by mouth at bedtime.   cyanocobalamin 1000 MCG/ML injection Commonly known as: VITAMIN B12 Inject 1 mL (1,000 mcg total) into the muscle once a week for 28 days, THEN 1 mL (1,000 mcg total) every 30 (thirty) days. Start taking on: October 15, 2023 What changed: See the new instructions.   enoxaparin 40 MG/0.4ML injection Commonly known as: LOVENOX Inject 0.4 mLs (40 mg total) into the skin daily for 14 days.   folic acid 1 MG tablet Commonly known as: FOLVITE Take 1 tablet (1 mg total) by mouth daily.   Iron (Ferrous Sulfate) 325 (65 Fe) MG Tabs Take 325 mg by mouth daily.   levalbuterol 0.63 MG/3ML nebulizer solution Commonly known as: XOPENEX Take 3 mLs (0.63 mg total) by nebulization every 6 (six) hours as needed for wheezing.   lisinopril 20 MG tablet Commonly known as: ZESTRIL Take 20 mg by mouth daily.   methocarbamol 500 MG tablet Commonly known as: ROBAXIN Take 1 tablet (500 mg total) by mouth every 6 (six) hours as needed for muscle spasms.   metoprolol tartrate 50 MG tablet Commonly known as: LOPRESSOR Take 1 tablet (50 mg total) by mouth 2 (two) times daily.   oxyCODONE 5 MG immediate release tablet Commonly known as: Oxy IR/ROXICODONE Take 1-2 tablets (5-10 mg total) by mouth 3 (three) times daily as needed for moderate pain (pain score 4-6). What changed:  how much to take when to take this reasons to take this   pantoprazole 40 MG tablet Commonly known as: PROTONIX Take 1 tablet (40 mg total) by mouth 2 (two) times daily.   polyethylene glycol 17 g packet Commonly known as: MIRALAX / GLYCOLAX Take 17 g by mouth daily.   QUEtiapine 25 MG tablet Commonly known as: SEROQUEL Take 0.5 tablets (12.5 mg total) by mouth at bedtime.   senna-docusate 8.6-50 MG tablet Commonly known as: Senokot-S Take 1 tablet by mouth 2 (two) times daily.   simvastatin 10 MG tablet Commonly known as: ZOCOR Take 10 mg by mouth at bedtime.         Follow-up Information     Cristie Hem, PA-C. Schedule an appointment as soon as possible for a visit in 2 week(s).   Specialty: Orthopedic Surgery Contact information: [address] [place] [address] [phone]                No Known Allergies  Consultations: Orthopedics   Procedures/Studies: DG C-Arm 1-60 Min-No Report Result Date: 10/11/2023 Fluoroscopy was utilized by the requesting physician.  No radiographic interpretation.   DG HIP UNILAT WITH PELVIS 2-3 VIEWS LEFT Result Date: 10/11/2023 CLINICAL DATA:  Left inter trochanteric femur fracture fixation. EXAM: DG HIP (WITH OR WITHOUT PELVIS) 2-3V LEFT COMPARISON:  10/11/2023. FINDINGS: Three intraoperative fluoroscopic spot views of the left hip are submitted. 2 screws with an intramedullary rod and distal interlocking screw traverse a previously seen intertrochanteric fracture of the left femur. IMPRESSION: Intraoperative visualization of left intratrochanteric femur fracture fixation. Electronically Signed   By: Leanna Battles M.D.   On: 10/11/2023 15:47   Chest Portable 1 View Result Date: 10/11/2023 CLINICAL DATA:  Cough, preop.  Fall. EXAM: PORTABLE CHEST 1 VIEW COMPARISON:  10/01/2023 and CT chest 08/15/2021. Left shoulder 05/10/2023. FINDINGS: Trachea is midline. Heart size stable. Prominent ascending aorta, as before. Lungs are clear. No pleural fluid. Displaced and impacted old left humeral neck fracture, as before. IMPRESSION: 1. No acute findings. 2. Ascending thoracic aortic aneurysm, as reported on 08/15/2021. Consider nonemergent CT angiography of the chest in further evaluation, as clinically indicated. 3. Old left humeral neck fracture. Electronically Signed   By: Leanna Battles M.D.   On: 10/11/2023 10:36   DG FEMUR PORT MIN 2 VIEWS LEFT Result Date: 10/11/2023 CLINICAL DATA:  Fall, pain. EXAM: LEFT FEMUR PORTABLE 2 VIEWS COMPARISON:  None Available. FINDINGS: Osteopenia. Comminuted inter  trochanteric fracture of left femur with 90 degrees varus angulation. Degenerative changes in the left knee, incidentally imaged. IMPRESSION: 1. Comminuted intertrochanteric left femur fracture with 90 degrees varus angulation. 2. Osteopenia. 3. Left knee osteoarthritis. Electronically Signed   By: Leanna Battles M.D.   On: 10/11/2023 09:55   DG Hip Unilat W or Wo Pelvis 2-3 Views Left Result Date: 10/11/2023 CLINICAL DATA:  Left hip pain status post fall EXAM: DG HIP (WITH OR WITHOUT PELVIS) 3V LEFT COMPARISON:  Left hip radiograph dated 03/19/2013 FINDINGS: Status post right hip arthroplasty. Hardware appears intact and well seated. Mildly displaced ossific fragment along the right greater trochanter may be postsurgical. Comminuted fracture of the intertrochanteric left femur with varus angulation. No acute dislocation. IMPRESSION: 1. Comminuted fracture of the intertrochanteric left femur with varus angulation. 2. Status post right hip arthroplasty. Mildly displaced ossific fragment along the right greater trochanter may be postsurgical. Electronically Signed   By: Agustin Cree M.D.   On: 10/11/2023 08:38   DG CHEST PORT 1 VIEW Result Date: 10/01/2023 CLINICAL DATA:  Fever EXAM: PORTABLE CHEST 1 VIEW COMPARISON:  09/30/2023. FINDINGS: The heart size and mediastinal contours are within normal limits. Tortuous and ectatic aorta. Mild basilar linear opacity bilateral, likely scar or atelectasis. Chronic deformity left proximal humerus. Overlapping cardiac leads IMPRESSION: Basilar atelectasis.  Tortuous aorta. Electronically Signed   By: Karen Kays M.D.   On: 10/01/2023 20:23   DG CHEST PORT 1 VIEW Result Date: 09/30/2023 CLINICAL DATA:  Shortness of breath EXAM: PORTABLE CHEST 1 VIEW COMPARISON:  09/25/2023 FINDINGS: Endotracheal tube and gastric catheter have been removed in the interval. Lungs are well aerated bilaterally. Tortuous thoracic aorta is again seen. IMPRESSION: No active disease.  Electronically Signed   By: Alcide Clever M.D.   On: 09/30/2023 10:41   ECHOCARDIOGRAM COMPLETE Result Date: 09/28/2023    ECHOCARDIOGRAM REPORT   Patient Name:   MAZIYAH VESSEL Christoffel Date of Exam: 09/28/2023 Medical Rec #:  [id number]  Height:       68.0 in Accession #:    [id number] Weight:       160.9 lb Date of Birth:  04-May-1946  BSA:          1.863 m Patient Age:    77 years   BP:           158/86 mmHg Patient Gender: F          HR:           86 bpm. Exam Location:  Inpatient Procedure: 2D Echo, Cardiac Doppler and Color Doppler (Both Spectral and Color            Flow Doppler were utilized during procedure). Indications:    Murmur R01.1  History:        Patient has prior history of Echocardiogram  examinations, most                 recent 04/04/2023.  Sonographer:    Lucendia Herrlich RCS Referring Phys: [phone] PAULA B SIMPSON IMPRESSIONS  1. Left ventricular ejection fraction, by estimation, is 70 to 75%. The left ventricle has hyperdynamic function. The left ventricle has no regional wall motion abnormalities. Left ventricular diastolic parameters were normal.  2. Right ventricular systolic function is normal. The right ventricular size is normal.  3. The mitral valve is normal in structure. No evidence of mitral valve regurgitation. No evidence of mitral stenosis.  4. The aortic valve has an indeterminant number of cusps. Aortic valve regurgitation is not visualized. No aortic stenosis is present.  5. Aortic dilatation noted. There is moderate dilatation of the ascending aorta, measuring 43 mm.  6. The inferior vena cava is normal in size with greater than 50% respiratory variability, suggesting right atrial pressure of 3 mmHg. FINDINGS  Left Ventricle: Left ventricular ejection fraction, by estimation, is 70 to 75%. The left ventricle has hyperdynamic function. The left ventricle has no regional wall motion abnormalities. The left ventricular internal cavity size was normal in size. There is no left ventricular  hypertrophy. Left ventricular diastolic parameters were normal. Right Ventricle: The right ventricular size is normal. No increase in right ventricular wall thickness. Right ventricular systolic function is normal. Left Atrium: Left atrial size was normal in size. Right Atrium: Right atrial size was normal in size. Pericardium: There is no evidence of pericardial effusion. Mitral Valve: The mitral valve is normal in structure. No evidence of mitral valve regurgitation. No evidence of mitral valve stenosis. Tricuspid Valve: The tricuspid valve is normal in structure. Tricuspid valve regurgitation is trivial. No evidence of tricuspid stenosis. Aortic Valve: The aortic valve has an indeterminant number of cusps. Aortic valve regurgitation is not visualized. No aortic stenosis is present. Aortic valve peak gradient measures 11.8 mmHg. Pulmonic Valve: The pulmonic valve was normal in structure. Pulmonic valve regurgitation is not visualized. No evidence of pulmonic stenosis. Aorta: Aortic dilatation noted. There is moderate dilatation of the ascending aorta, measuring 43 mm. Venous: The inferior vena cava is normal in size with greater than 50% respiratory variability, suggesting right atrial pressure of 3 mmHg. IAS/Shunts: No atrial level shunt detected by color flow Doppler.  LEFT VENTRICLE PLAX 2D LVIDd:         2.70 cm   Diastology LVIDs:         1.80 cm   LV e' medial:    5.33 cm/s LV PW:         0.80 cm   LV E/e' medial:  14.9 LV IVS:        1.00 cm   LV e' lateral:   7.29 cm/s LVOT diam:     2.00 cm   LV E/e' lateral: 10.9 LV SV:         57 LV SV Index:   30 LVOT Area:     3.14 cm  RIGHT VENTRICLE             IVC RV S prime:     20.70 cm/s  IVC diam: 2.00 cm TAPSE (M-mode): 1.6 cm LEFT ATRIUM             Index        RIGHT ATRIUM           Index LA diam:        3.00 cm 1.61 cm/m  RA Area:     14.30 cm LA Vol (A2C):   25.0 ml 13.42 ml/m  RA Volume:   34.90 ml  18.73 ml/m LA Vol (A4C):   21.0 ml 11.27 ml/m LA  Biplane Vol: 27.1 ml 14.54 ml/m  AORTIC VALVE AV Area (Vmax): 1.73 cm AV Vmax:        172.00 cm/s AV Peak Grad:   11.8 mmHg LVOT Vmax:      94.90 cm/s LVOT Vmean:     62.100 cm/s LVOT VTI:       0.180 m  AORTA Ao Root diam: 3.60 cm Ao Asc diam:  4.30 cm MITRAL VALVE MV Area (PHT): 4.18 cm    SHUNTS MV Decel Time: 182 msec    Systemic VTI:  0.18 m MV E velocity: 79.35 cm/s  Systemic Diam: 2.00 cm MV A velocity: 93.40 cm/s MV E/A ratio:  0.85 Aditya Sabharwal Electronically signed by Dorthula Nettles Signature Date/Time: 09/28/2023/11:03:18 AM    Final    Overnight EEG with video Result Date: 09/26/2023 Charlsie Quest, MD     09/27/2023 11:08 AM Patient Name: VELICIA DEJAGER MRN: [id number] Epilepsy Attending: Charlsie Quest Referring Physician/Provider: Lynnell Catalan, MD Duration: 09/25/2023 1137 to 09/26/2023 1137  Patient history: 78 year old female presented with acute onset of left sided weakness, aphasia and left hemineglect. EEG to evaluate for seizure  Level of alertness:  comatose/ lethargic  AEDs during EEG study: CBZ, Propofol  Technical aspects: This EEG study was done with scalp electrodes positioned according to the 10-20 International system of electrode placement. Electrical activity was reviewed with band pass filter of 1-70Hz , sensitivity of 7 uV/mm, display speed of 21mm/sec with a 60Hz  notched filter applied as appropriate. EEG data were recorded continuously and digitally stored.  Video monitoring was available and reviewed as appropriate.  Description: EEG showed continuous generalized and lateralized right hemisphere 3 to 6 Hz theta-delta slowing. Sharply contoured waves were noted in left posterior quadrant. Hyperventilation and photic stimulation were not performed.    ABNORMALITY -Continuous slow generalized and lateralized right hemisphere  IMPRESSION: This study is suggestive of cortical dysfunction in right hemisphere likely secondary to underlying structural abnormality, postictal state.   Additionally there is moderate to severe diffuse encephalopathy.  No seizures or definite epileptiform discharges were seen throughout the recording.  Charlsie Quest   DG Chest Portable 1 View Result Date: 09/25/2023 CLINICAL DATA:  Intubation EXAM: PORTABLE CHEST 1 VIEW COMPARISON:  04/03/2023 x-ray FINDINGS: No consolidation, pneumothorax or effusion. No edema. Normal cardiopericardial silhouette. Tortuous ectatic aorta. ET tube in place with tip seen 2.4 cm above the carina. Enteric tube in place coiling overlying the left upper quadrant with tip overlying the fundus of the stomach. Minimal left basilar scar atelectasis. Fracture deformity of the left proximal humerus. Please correlate with x-rays of 05/10/2023. IMPRESSION: New ET tube and enteric tube. Left basilar atelectasis. Known fracture of the left proximal humerus. Electronically Signed   By: Karen Kays M.D.   On: 09/25/2023 10:21   EEG adult Result Date: 09/25/2023 Charlsie Quest, MD     09/25/2023  9:58 AM Patient Name: DAWNYA GRAMS MRN: [id number] Epilepsy Attending: Charlsie Quest Referring Physician/Provider: Caryl Pina, MD Date: 09/25/2023 Duration: 26.36 mins Patient history: 78 year old female presented with acute onset of left sided weakness, aphasia and left hemineglect. EEG to evaluate for seizure Level of alertness:  comatose/ lethargic AEDs during EEG study: Ativan Technical aspects: This EEG study  was done with scalp electrodes positioned according to the 10-20 International system of electrode placement. Electrical activity was reviewed with band pass filter of 1-70Hz , sensitivity of 7 uV/mm, display speed of 88mm/sec with a 60Hz  notched filter applied as appropriate. EEG data were recorded continuously and digitally stored.  Video monitoring was available and reviewed as appropriate. Description: EEG showed continuous generalized and lateralized right hemisphere 3 to 6 Hz theta-delta slowing.  Hyperventilation and photic  stimulation were not performed.   ABNORMALITY -Continuous slow generalized and lateralized right hemisphere IMPRESSION: This study is suggestive of cortical dysfunction in right hemisphere likely secondary to underlying structural abnormality, postictal state.  Additionally there is moderate to severe diffuse encephalopathy.  No seizures or definite epileptiform discharges were seen throughout the recording. Charlsie Quest   MR BRAIN W WO CONTRAST Result Date: 09/25/2023 CLINICAL DATA:  78 year old female code stroke presentation. Left side weakness reported. EXAM: MRI HEAD WITHOUT AND WITH CONTRAST TECHNIQUE: Multiplanar, multiecho pulse sequences of the brain and surrounding structures were obtained without and with intravenous contrast. CONTRAST:  7mL GADAVIST GADOBUTROL 1 MMOL/ML IV SOLN COMPARISON:  CT head, CTA, CTP this morning. Previous brain MRI 02/27/2023. FINDINGS: Brain: DWI is mildly motion degraded. No convincing diffusion restriction, acute infarct. Choroid plexus cysts, normal variant and stable. Stable cerebral volume. Nomidline shift, mass effect, ventriculomegaly, or acute intracranial hemorrhage. Cervicomedullary junction and pituitary are within normal limits. Left quadrigeminal plate extra-axial up to 1.2 cm lesion with mixed signal again noted, fluid fluid level. This is stable, benign, and without regional brain edema. No enhancement following contrast. Stable gray and white matter signal throughout the brain. Patchy mild to moderate for age cerebral white matter T2 and FLAIR hyperintensity. Mild chronic T2 heterogeneity in the deep gray matter nuclei, pons. No cortical encephalomalacia. However, there is possibly a chronic microhemorrhage in the right pons on SWI (series 7, image 30), not apparent on T2* previously. No abnormal enhancement identified. No dural thickening. Vascular: Major intracranial vascular flow voids are preserved. Following contrast major dural venous sinuses are  enhancing and appear to be patent. Skull and upper cervical spine: Stable. Congenital incomplete segmentation of C2-C3. Visualized bone marrow signal is within normal limits. Sinuses/Orbits: Stable. Other: Mild left mastoid effusion is stable. Negative nasopharynx aside from trace retained secretions. Other visible internal auditory structures appear grossly normal. IMPRESSION: 1. No acute intracranial abnormality. 2. Mild to moderate for age signal changes of chronic small vessel disease. 3. 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 Electronically Signed   By: Odessa Fleming M.D.   On: 09/25/2023 06:17   CT ANGIO HEAD NECK W WO CM W PERF (CODE STROKE) Result Date: 09/25/2023 CLINICAL DATA:  78 year old female code stroke presentation. Left side weakness. EXAM: CT ANGIOGRAPHY HEAD AND NECK CT PERFUSION BRAIN TECHNIQUE: Multidetector CT imaging of the head and neck was performed using the standard protocol during bolus administration of intravenous contrast. Multiplanar CT image reconstructions and MIPs were obtained to evaluate the vascular anatomy. Carotid stenosis measurements (when applicable) are obtained utilizing NASCET criteria, using the distal internal carotid diameter as the denominator. Multiphase CT imaging of the brain was performed following IV bolus contrast injection. Subsequent parametric perfusion maps were calculated using RAPID software. RADIATION DOSE REDUCTION: This exam was performed according to the departmental dose-optimization program which includes automated exposure control, adjustment of the mA and/or kV according to patient size and/or use of iterative reconstruction technique. CONTRAST:  OMNIPAQUE  IOHEXOL 350 MG/ML SOLN COMPARISON:  Plain head CT 0344 hours today. FINDINGS: CT Brain Perfusion Findings: ASPECTS: 10 Motion degraded. CBF (<30%) Volume: 0mL Perfusion (Tmax>6.0s) volume: 0mL Mismatch Volume: Not applicable  Infarction Location:Not applicable CTA NECK Skeleton: Cervical spine degeneration superimposed on congenital appearing incomplete segmentation of C2-C3. Spondylolisthesis, scoliosis. No acute osseous abnormality identified. Upper chest: Negative. Other neck: Neck soft tissue spaces appear within normal limits. Aortic arch: Tortuous 3 vessel arch configuration. Minimal arch atherosclerosis. Right carotid system: Tortuous brachiocephalic artery without plaque or stenosis. Negative right CCA. Minimal plaque at the right carotid bifurcation. Mild right ICA tortuosity without stenosis. Left carotid system: Similar mild plaque and tortuosity with no significant stenosis. Vertebral arteries: Tortuous proximal right subclavian artery with normal right vertebral artery origin. Tortuous right V1 segment. Mildly dominant right vertebral artery with tortuosity is patent to the skull base with no significant plaque or stenosis. Mildly tortuous proximal left subclavian artery with no plaque. Normal left vertebral artery origin. Mildly non dominant but normal size left vertebral artery is patent to the skull base with no significant plaque or stenosis. CTA HEAD Posterior circulation: Patent, mildly tortuous distal vertebral arteries and vertebrobasilar junction. Mild right V4 calcified plaque without stenosis. Patent PICA origins. Patent basilar artery without stenosis. Patent SCA and PCA origins. Posterior communicating arteries are diminutive or absent. Mildly ectatic basilar artery. Bilateral PCA branches are patent with mild irregularity. Anterior circulation: Both ICA siphons are patent. Both siphons are tortuous. Mild calcified plaque affecting the cavernous segments and anterior genu bilaterally. No siphon stenosis. Patent carotid termini. Normal MCA and ACA origins. Normal anterior communicating artery. Moderate stenosis left ACA A2 segment (series 12, image 38). Other bilateral ACA branches are within normal limits. Left  MCA M1 segment bifurcates early without stenosis. Right MCA M1 segment is tortuous and bifurcates without stenosis. Bilateral MCA branches are within normal limits. Venous sinuses: Early contrast timing, grossly patent. Anatomic variants: Mildly dominant right vertebral artery. Review of the MIP images confirms the above findings IMPRESSION: 1. CTA is negative for large vessel occlusion. Negative motion degraded CT Perfusion. 2. Generalized vessel tortuosity with mild for age atherosclerosis in the head and neck. Although Moderate stenosis of the Left ACA A2 segment. No other significant arterial stenosis. Study discussed by telephone with Dr. Caryl Pina on 09/25/2023 at 04:12 . Electronically Signed   By: Odessa Fleming M.D.   On: 09/25/2023 04:12   CT HEAD CODE STROKE WO CONTRAST Result Date: 09/25/2023 CLINICAL DATA:  Code stroke.  Neuro deficit, acute, stroke suspected EXAM: CT HEAD WITHOUT CONTRAST TECHNIQUE: Contiguous axial images were obtained from the base of the skull through the vertex without intravenous contrast. RADIATION DOSE REDUCTION: This exam was performed according to the departmental dose-optimization program which includes automated exposure control, adjustment of the mA and/or kV according to patient size and/or use of iterative reconstruction technique. COMPARISON:  CT head May 10, 2023. FINDINGS: Motion limited study.  Within this limitation: Brain: No evidence of acute large vascular territory infarction, hemorrhage, hydrocephalus, extra-axial collection or midline shift. 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 Vascular: Calcific atherosclerosis. No hyperdense vessel identified. Skull: No acute fracture. Sinuses/Orbits: Clear sinuses.  No acute orbital findings. Other: No mastoid effusions. ASPECTS [facility] Stroke Program Early CT Score)Total score (0-10 with 10 being  normal): 10. IMPRESSION: 1. No evidence of acute intracranial abnormality on this motion  limited study. ASPECTS is 10. 2. 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 Code stroke imaging results were communicated on 09/25/2023 at 3:52 am to provider Dr. Otelia Limes via secure text paging. Electronically Signed   By: Feliberto Harts M.D.   On: 09/25/2023 03:53   (Echo, Carotid, EGD, Colonoscopy, ERCP)    Subjective: Patient seen and examined.  Denies any complaints.  She thinks she may need 1 more day in the hospital, however we discussed about stability and her care can be resumed at [facility].  Discussed about fall precautions and she is agreeable.  Hemoglobin is 9.   Discharge Exam: Vitals:   10/15/23 0813 10/15/23 0815  BP: 105/77 113/67  Pulse: 81 78  Resp: 16 17  Temp: 98.2 F (36.8 C) 98.4 F (36.9 C)  SpO2: 100% 100%   Vitals:   10/15/23 0434 10/15/23 0500 10/15/23 0813 10/15/23 0815  BP: 127/74  105/77 113/67  Pulse: 63  81 78  Resp: 17  16 17   Temp: 98.3 F (36.8 C)  98.2 F (36.8 C) 98.4 F (36.9 C)  TempSrc: Oral  Oral Oral  SpO2: 97%  100% 100%  Weight:  68.1 kg    Height:        General: Pt is alert, awake, not in acute distress.  Flat affect but interactive. Cardiovascular: RRR, S1/S2 +, no rubs, no gallops Respiratory: CTA bilaterally, no wheezing, no rhonchi Abdominal: Soft, NT, ND, bowel sounds + Extremities: no edema, no cyanosis Left lateral thigh incisions intact.  She has clean staples intact.    The results of significant diagnostics from this hospitalization (including imaging, microbiology, ancillary and laboratory) are listed below for reference.     Microbiology: No results found for this or any previous visit (from the past 240 hours).   Labs: BNP (last 3 results) No results for input(s): "BNP" in the last 8760 hours. Basic Metabolic  Panel: Recent Labs  Lab 10/11/23 0808 10/11/23 0935 10/12/23 0801 10/13/23 0619 10/14/23 0557 10/15/23 0634  NA 132*  --  127* 130* 127* 128*  K 3.1*  --  3.7 3.8 3.9 4.1  CL 96*  --  94* 96* 93* 93*  CO2 28  --  23 27 26 28   GLUCOSE 148*  --  115* 112* 101* 115*  BUN 5*  --  9 7* 9 9  CREATININE 0.55  --  0.69 0.58 0.54 0.60  CALCIUM 9.7  --  9.1 9.6 9.6 9.7  MG  --  1.6*  --   --   --   --    Liver Function Tests: Recent Labs  Lab 10/15/23 0634  AST 16  ALT 6  ALKPHOS 105  BILITOT 0.6  PROT 5.6*  ALBUMIN 2.5*   No results for input(s): "LIPASE", "AMYLASE" in the last 168 hours. No results for input(s): "AMMONIA" in the last 168 hours. CBC: Recent Labs  Lab 10/11/23 0808 10/12/23 0955 10/13/23 0619 10/14/23 0557 10/15/23 0634  WBC 5.2 4.4 4.4 4.9 5.0  NEUTROABS  --   --   --   --  2.8  HGB 10.9* 7.1* 7.8* 7.0* 9.1*  HCT 31.6* 20.9* 22.7* 20.6* 26.2*  MCV 95.2 96.8 95.8 94.9 92.3  PLT 342 248 275 255 292   Cardiac Enzymes: No results for input(s): "CKTOTAL", "CKMB", "CKMBINDEX", "TROPONINI" in the last 168 hours. BNP: Invalid input(s): "POCBNP" CBG: No results  for input(s): "GLUCAP" in the last 168 hours. D-Dimer No results for input(s): "DDIMER" in the last 72 hours. Hgb A1c No results for input(s): "HGBA1C" in the last 72 hours. Lipid Profile No results for input(s): "CHOL", "HDL", "LDLCALC", "TRIG", "CHOLHDL", "LDLDIRECT" in the last 72 hours. Thyroid function studies No results for input(s): "TSH", "T4TOTAL", "T3FREE", "THYROIDAB" in the last 72 hours.  Invalid input(s): "FREET3" Anemia work up Recent Labs    10/13/23 0619  VITAMINB12 1,431*  FOLATE 14.8  FERRITIN 294  TIBC 202*  IRON 19*  RETICCTPCT 1.7   Urinalysis    Component Value Date/Time   COLORURINE YELLOW 10/11/2023 0935   APPEARANCEUR CLOUDY (A) 10/11/2023 0935   LABSPEC 1.015 10/11/2023 0935   PHURINE 6.0 10/11/2023 0935   GLUCOSEU NEGATIVE 10/11/2023 0935   HGBUR  NEGATIVE 10/11/2023 0935   BILIRUBINUR NEGATIVE 10/11/2023 0935   KETONESUR NEGATIVE 10/11/2023 0935   PROTEINUR NEGATIVE 10/11/2023 0935   NITRITE NEGATIVE 10/11/2023 0935   LEUKOCYTESUR SMALL (A) 10/11/2023 0935   Sepsis Labs Recent Labs  Lab 10/12/23 0955 10/13/23 0619 10/14/23 0557 10/15/23 0634  WBC 4.4 4.4 4.9 5.0   Microbiology No results found for this or any previous visit (from the past 240 hours).   Time coordinating discharge: 40 minutes  SIGNED:   Dorcas Carrow, MD  [facility] 10/15/2023, 9:09 AM

## 2023-10-15 NOTE — TOC Transition Note (Signed)
 Transition of Care [facility]) - Discharge Note   Patient Details  Name: Emily Blair MRN: [id number] Date of Birth: Apr 18, 1946  Transition of Care [facility] CM/SW Contact:  Lorri Frederick, LCSW Phone Number: 10/15/2023, 10:31 AM   Clinical Narrative:   Pt discharging to [facility], room 406-2.  RN call report to [phone].  PTAR called [phone].    Final next level of care: Skilled Nursing Facility Barriers to Discharge: Barriers Resolved   Patient Goals and CMS Choice Patient states their goals for this hospitalization and ongoing recovery are:: unable to state a goal   Choice offered to / list presented to : Sibling (sister mary)      Discharge Placement              Patient chooses bed at:  [facility]) Patient to be transferred to facility by: PTAR Name of family member notified: sister Corrie Dandy Patient and family notified of of transfer: 10/15/23  Discharge Plan and Services Additional resources added to the After Visit Summary for   In-house Referral: Clinical Social Work   Post Acute Care Choice: Skilled Nursing Facility                               Social Drivers of Health (SDOH) Interventions SDOH Screenings   Food Insecurity: No Food Insecurity (10/11/2023)  Housing: Low Risk  (10/11/2023)  Transportation Needs: No Transportation Needs (10/11/2023)  Utilities: Not At Risk (10/11/2023)  Social Connections: Unknown (10/11/2023)  Tobacco Use: Unknown (10/11/2023)     Readmission Risk Interventions     No data to display

## 2023-10-22 ENCOUNTER — Other Ambulatory Visit (INDEPENDENT_AMBULATORY_CARE_PROVIDER_SITE_OTHER): Payer: Self-pay

## 2023-10-22 ENCOUNTER — Encounter: Payer: Self-pay | Admitting: Physician Assistant

## 2023-10-22 ENCOUNTER — Ambulatory Visit (INDEPENDENT_AMBULATORY_CARE_PROVIDER_SITE_OTHER): Admitting: Physician Assistant

## 2023-10-22 DIAGNOSIS — M25552 Pain in left hip: Secondary | ICD-10-CM

## 2023-10-22 DIAGNOSIS — M810 Age-related osteoporosis without current pathological fracture: Secondary | ICD-10-CM

## 2023-10-22 DIAGNOSIS — S72142A Displaced intertrochanteric fracture of left femur, initial encounter for closed fracture: Secondary | ICD-10-CM

## 2023-10-22 NOTE — Progress Notes (Signed)
   Post-Op Visit Note   Patient: Emily Blair           Date of Birth: [birth year]           MRN: [id number] Visit Date: 10/22/2023 PCP: Maris Berger, MD   Assessment & Plan:  Chief Complaint:  Chief Complaint  Patient presents with   Left Hip - Follow-up    IM nailing 10/11/2023   Visit Diagnoses:  1. Pain in left hip   2. Displaced intertrochanteric fracture of left femur, initial encounter for closed fracture [facility])     Plan: Patient is a pleasant 78 year old female who comes in today 11 days status post left hip IM nail from intertrochanteric femur fracture, date of surgery 10/11/2023.  She has been doing well.  She is currently living at [facility] for rehab.  She is getting therapy.  She tells me she was walking unassisted prior to her fall.  Examination of the left hip reveals well healing surgical incisions with staples intact.  She does have a small seroma to the distal incision.  No evidence of infection or cellulitis.  Calves are soft and nontender.  She is neurovascularly intact distally.  At this point, it is too early to remove her staples.  I have written order to have these taken out next week at the facility and apply Steri-Strips.  She will continue with physical therapy.  We have put in a referral to follow-up with [facility] for her osteoporosis clinic.  She will follow-up with [place] in 4 weeks for repeat evaluation and x-rays of the left femur.  Call with concerns or questions.  Follow-Up Instructions: Return in about 4 weeks (around 11/19/2023).   Orders:  Orders Placed This Encounter  Procedures   XR HIP UNILAT W OR W/O PELVIS 2-3 VIEWS LEFT   No orders of the defined types were placed in this encounter.   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  Past Surgical History:  Procedure Laterality Date   FRACTURE SURGERY Right    hip   INTRAMEDULLARY (IM) NAIL INTERTROCHANTERIC Left 10/11/2023   Procedure: FIXATION, FRACTURE, INTERTROCHANTERIC, WITH INTRAMEDULLARY ROD;  Surgeon: Tarry Kos, MD;  Location: [facility] OR;  Service: Orthopedics;  Laterality: Left;   Social History   Occupational History   Not on file  Tobacco Use   Smoking status: Never   Smokeless tobacco: Not on file  Substance and Sexual Activity   Alcohol use: No   Drug use: Not on file   Sexual activity: Not on file

## 2023-10-22 NOTE — Addendum Note (Signed)
 Addended by: Albertina Parr on: 10/22/2023 10:39 AM   Modules accepted: Orders

## 2023-11-09 ENCOUNTER — Encounter: Payer: Self-pay | Admitting: Physician Assistant

## 2023-11-09 ENCOUNTER — Ambulatory Visit (INDEPENDENT_AMBULATORY_CARE_PROVIDER_SITE_OTHER): Admitting: Physician Assistant

## 2023-11-09 DIAGNOSIS — M810 Age-related osteoporosis without current pathological fracture: Secondary | ICD-10-CM | POA: Diagnosis not present

## 2023-11-09 NOTE — Progress Notes (Signed)
 Office Visit Note   Patient: Emily Blair           Date of Birth: 1946-07-11           MRN: [id number] Visit Date: 11/09/2023              Requested by: Wes Hamman, MD [address] [place]  [address] [place],  [place] [address] PCP: Ardella Beaver, MD   Assessment & Plan: Visit Diagnoses:  1. Age-related osteoporosis without current pathological fracture     Plan: Emily Blair is a pleasant 78 year old woman who comes in today from a nursing facility.  She has a history of a vertebral fracture as well as a humerus fracture.  Most recently she had a hip fracture that was treated with medullary rodding by Dr. Christiane Cowing.  Her history is limited but answered by one of the facility staff.  She does have a history of bipolar disorder does not look as though she is currently taking any medication for osteoporosis.  No history of cancer no history reported of stroke or heart attack though there was some concern that she had a stroke in the past.  Remotely she has been on Tegretol  for seizure disorder.  She has no history of kidney disease ulcers bypass surgery or reflux.  No history of epilepsy.  She went through menopause at age 31.  Currently calcium or vitamin D  however she her most recent labs do show these to be adequate.  She never smoked and does not drink.  She does not do any exercise.  No major dental work.  Certainly a difficult case as she is no longer getting physical therapy at the facility and had not completed return to ambulation.  Explained to her that her not ambulating will continue to contribute to her osteoporosis.  Also talked about getting involved in a group chair strengthening if they have that at the facility.  Would encourage her to continue to eat a healthy diet and keep her weight adequate currently BMI of 21.  We will get a bone density scan but based on her history she may not be a candidate for anything but conservative treatment.  Over 3540 minutes was spent reviewing her chart discussing treatment  options and the role of osteoporosis with the patient  Follow-Up Instructions: Return if symptoms worsen or fail to improve.   Orders:  Orders Placed This Encounter  Procedures   DG BONE DENSITY (DXA)   No orders of the defined types were placed in this encounter.     Procedures: No procedures performed   Clinical Data: No additional findings.   Subjective: Osteoporosis  HPI Emily Blair is a 78 year old woman with a history of a humerus fracture vertebral fracture and now hip fracture.  She is brought in from the facility for evaluation of osteoporosis.  She is currently mostly wheelchair-bound as her insurance no longer approved PT she only stepped takes a few steps to go to the restroom  Review of Systems  All other systems reviewed and are negative.    Objective: Vital Signs: There were no vitals taken for this visit.  Physical Exam Constitutional:      Appearance: Normal appearance.  Pulmonary:     Effort: Pulmonary effort is normal.  Skin:    General: Skin is warm and dry.  Neurological:     General: No focal deficit present.     Mental Status: She is alert and oriented to person, place, and time.    Ortho Exam  Specialty Comments:  No specialty comments available.  Imaging: No results found.   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 No pertinent family history.  Past Surgical History:  Procedure Laterality Date   FRACTURE SURGERY Right    hip   INTRAMEDULLARY (IM) NAIL INTERTROCHANTERIC Left 10/11/2023   Procedure: FIXATION, FRACTURE, INTERTROCHANTERIC, WITH INTRAMEDULLARY ROD;  Surgeon: Wes Hamman, MD;  Location: [facility] OR;  Service: Orthopedics;  Laterality: Left;   Social History   Occupational History   Not on file  Tobacco Use   Smoking status: Never   Smokeless tobacco: Not on file  Substance and Sexual Activity   Alcohol use: No   Drug use: Not on file   Sexual activity: Not on file

## 2023-11-16 ENCOUNTER — Telehealth: Payer: Self-pay | Admitting: Physician Assistant

## 2023-11-16 NOTE — Telephone Encounter (Signed)
 Emily Blair. With [facility] called says her insurance will not approve the bone density because she had 1 in 2024 2/2. Has to be 2 years or more. Would like a call back. [phone]

## 2023-11-17 ENCOUNTER — Ambulatory Visit (INDEPENDENT_AMBULATORY_CARE_PROVIDER_SITE_OTHER): Admitting: Physician Assistant

## 2023-11-17 ENCOUNTER — Other Ambulatory Visit (INDEPENDENT_AMBULATORY_CARE_PROVIDER_SITE_OTHER): Payer: Self-pay

## 2023-11-17 ENCOUNTER — Encounter: Payer: Self-pay | Admitting: Physician Assistant

## 2023-11-17 DIAGNOSIS — M25552 Pain in left hip: Secondary | ICD-10-CM

## 2023-11-17 DIAGNOSIS — S72142A Displaced intertrochanteric fracture of left femur, initial encounter for closed fracture: Secondary | ICD-10-CM

## 2023-11-17 NOTE — Progress Notes (Addendum)
   Post-Op Visit Note   Patient: Emily Blair           Date of Birth: [birth year]           MRN: [id number] Visit Date: 11/17/2023 PCP: Ardella Beaver, MD   Assessment & Plan:  Chief Complaint:  Chief Complaint  Patient presents with   Left Hip - Follow-up    Left hip IM nailing 10/11/2023   Visit Diagnoses:  1. Pain in left hip   2. Displaced intertrochanteric fracture of left femur, initial encounter for closed fracture [facility])     Plan: Patient is a pleasant 78 year old female who comes in today 6 weeks status post left hip IM nail from intertrochanteric femur fracture, date of surgery 10/11/2023.  She is still residing at [facility].  She has clinically been doing better.  She still has some pain.  She is ambulating today in a wheelchair but has been ambulating with a walker when working with PT.  Of note, she was ambulating without assistance prior to her fall.  Of note, she was to the osteoporosis clinic with [facility]. Examination of the left hip reveals painless hip flexion and logroll.  She is neurovascularly intact distally.  At this point, she will continue to work with physical therapy.  Follow-up with us  in 6 weeks for repeat evaluation and x-rays of the left hip.  At that point, if she is continuing to demonstrate persistent fracture lucency, we will make a referral to kidney for a bone stimulator.  Follow-Up Instructions: Return in about 6 weeks (around 12/29/2023).   Orders:  Orders Placed This Encounter  Procedures   XR HIP UNILAT W OR W/O PELVIS 2-3 VIEWS LEFT   No orders of the defined types were placed in this encounter.   Imaging: XR HIP UNILAT W OR W/O PELVIS 2-3 VIEWS LEFT Result Date: 11/17/2023 X-rays demonstrate persistent fracture lucency.  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  Past Surgical History:  Procedure Laterality Date   FRACTURE SURGERY Right    hip   INTRAMEDULLARY (IM) NAIL INTERTROCHANTERIC Left 10/11/2023   Procedure: FIXATION, FRACTURE, INTERTROCHANTERIC, WITH INTRAMEDULLARY ROD;  Surgeon: Wes Hamman, MD;  Location: [facility] OR;  Service: Orthopedics;  Laterality: Left;   Social History   Occupational History   Not on file  Tobacco Use   Smoking status: Never   Smokeless tobacco: Not on file  Substance and Sexual Activity   Alcohol use: No   Drug use: Not on file   Sexual activity: Not on file

## 2023-12-17 ENCOUNTER — Encounter: Payer: Self-pay | Admitting: Neurology

## 2023-12-17 ENCOUNTER — Ambulatory Visit (INDEPENDENT_AMBULATORY_CARE_PROVIDER_SITE_OTHER): Admitting: Neurology

## 2023-12-17 VITALS — BP 168/99 | HR 58 | Resp 15 | Ht 68.0 in | Wt 150.1 lb

## 2023-12-17 DIAGNOSIS — E538 Deficiency of other specified B group vitamins: Secondary | ICD-10-CM

## 2023-12-17 DIAGNOSIS — R569 Unspecified convulsions: Secondary | ICD-10-CM | POA: Diagnosis not present

## 2023-12-17 NOTE — Patient Instructions (Addendum)
 Continue current medications  Encourage physical therapy  Return as needed

## 2023-12-17 NOTE — Progress Notes (Signed)
 [facility]  PATIENT: Emily Blair DOB: 01-10-1946  REQUESTING CLINICIAN: Eleni Griffin, MD HISTORY FROM: Chart review  REASON FOR VISIT: Hospital follow up. Possible seizure    HISTORICAL  CHIEF COMPLAINT:  Chief Complaint  Patient presents with   New Patient (Initial Visit)    Rm12, transportation present, NP/internal hospital referral for metabolic encephalopathy, possible seizure: pt wasn't aware that she had a possible sz. Denied ha's or any pain relating to neurology.     HISTORY OF PRESENT ILLNESS:  This is a 78 year old woman history of bipolar disorder, hypertension, osteoporosis, dementia, recent hip fracture who is presenting after being admitted to the hospital in early March for confusion, altered mental status and thought to have a seizure.  Patient did have a full workup including brain MRI which was negative for any acute stroke.  Her EEG showed diffuse slowing with right focal hemispheric slowing.  She is on carbamazepine  extended release but for bipolar disorder, reports compliance.  She was also found to have low B12 for which she was getting B12 injection. After discharge in the hospital on March 15, she represented on March 23 after a fall and hip fracture.  She is following with orthopedics, is doing well.  She is accompanied by her transport today who tells me that patient looks better compared to when she was in the hospital.  She could not provide a clear history due to her dementia but overall per transport patient is doing and feeling better.   Brief Hospital Course and Summary: Patient is a 78 year old female with a history of arthritis, bipolar disorder, HTN, osteoporosis sent from ALF due to acute left-sided weakness, aphasia and left hemineglect.  Facility noted that she had not been behaving normally at that time, had some confusion and then was found on the floor early morning with worsening confusion.  In ED, code stroke was called  however CT perfusion and MRI were both negative.   Due to worsening mental status and concern for airway protection, she was electively intubated.  Workup was initiated, underwent continuous EEG and had no clinical seizures.  She was placed on low-dose Precedex  in ICU. Transferred to [facility] on 09/29/2023, remains persistently confused.  Neurology was requested to reconsult again.   Assessment and Plan: Acute encephalopathy, left-sided weakness/left hemineglect - Concern for seizure/prolonged postictal state -No prior history of seizures, workup negative with imaging including MRI. -At baseline, she is fairly independent, has mild dementia, able to go to the bathroom on her own.  Has history of falls, left upper humerus fracture. -Underwent LTM EEG, antibiotics have been discontinued -Neurology following, recommended to continue B12 replacement, Tegretol   -Mental status continues to improve, continue Seroquel  12.5 mg daily at bedtime   Acute Respiratory Failure due to aspiration pneumonia -Extubated on 3/9, now on room air -Completed IV Unasyn  -Continue nebs, aspiration precautions   Severe B12 deficiency -B12 50, placed on vitamin B12 replacement -continue B12 1000 mcg IM daily for 7 days, then taper to 1000 mcg IM weekly for 7 doses, then monthly. -Added folic acid  1 mg daily  OTHER MEDICAL CONDITIONS: Hypertension, type dementia, bipolar disorder, recent hip fracture.   REVIEW OF SYSTEMS: Full 14 system review of systems performed and negative with exception of: Unable to fully obtain due to patient dementia   ALLERGIES: No Known Allergies  HOME MEDICATIONS: Outpatient Medications Prior to Visit  Medication Sig Dispense Refill   acetaminophen  (TYLENOL ) 650 MG CR tablet Take 650 mg by  mouth every 8 (eight) hours as needed for pain.     carbamazepine  (TEGRETOL  XR) 400 MG 12 hr tablet Take 400 mg by mouth at bedtime.     cyanocobalamin  (VITAMIN B12) 1000 MCG/ML injection Inject 1 mL  (1,000 mcg total) into the muscle once a week for 28 days, THEN 1 mL (1,000 mcg total) every 30 (thirty) days.     folic acid  (FOLVITE ) 1 MG tablet Take 1 tablet (1 mg total) by mouth daily.     Iron , Ferrous Sulfate , 325 (65 Fe) MG TABS Take 325 mg by mouth daily.     levalbuterol  (XOPENEX ) 0.63 MG/3ML nebulizer solution Take 3 mLs (0.63 mg total) by nebulization every 6 (six) hours as needed for wheezing.     lisinopril  (ZESTRIL ) 20 MG tablet Take 20 mg by mouth daily.     methocarbamol  (ROBAXIN ) 500 MG tablet Take 1 tablet (500 mg total) by mouth every 6 (six) hours as needed for muscle spasms.     metoprolol  tartrate (LOPRESSOR ) 50 MG tablet Take 1 tablet (50 mg total) by mouth 2 (two) times daily.     oxyCODONE  (OXY IR/ROXICODONE ) 5 MG immediate release tablet Take 1-2 tablets (5-10 mg total) by mouth 3 (three) times daily as needed for moderate pain (pain score 4-6). 30 tablet 0   pantoprazole  (PROTONIX ) 40 MG tablet Take 1 tablet (40 mg total) by mouth 2 (two) times daily.     polyethylene glycol (MIRALAX  / GLYCOLAX ) 17 g packet Take 17 g by mouth daily.     senna-docusate (SENOKOT-S) 8.6-50 MG tablet Take 1 tablet by mouth 2 (two) times daily.     simvastatin  (ZOCOR ) 10 MG tablet Take 10 mg by mouth at bedtime.     enoxaparin  (LOVENOX ) 40 MG/0.4ML injection Inject 0.4 mLs (40 mg total) into the skin daily for 14 days. 5.6 mL 0   QUEtiapine  (SEROQUEL ) 25 MG tablet Take 0.5 tablets (12.5 mg total) by mouth at bedtime. 10 tablet 0   No facility-administered medications prior to visit.    PAST MEDICAL HISTORY: Past Medical History:  Diagnosis Date   Arthritis    Bipolar 1 disorder (HCC)    Hypertension    Osteoporosis     PAST SURGICAL HISTORY: Past Surgical History:  Procedure Laterality Date   FRACTURE SURGERY Right    hip   INTRAMEDULLARY (IM) NAIL INTERTROCHANTERIC Left 10/11/2023   Procedure: FIXATION, FRACTURE, INTERTROCHANTERIC, WITH INTRAMEDULLARY ROD;  Surgeon: Wes Hamman, MD;  Location: [facility] OR;  Service: Orthopedics;  Laterality: Left;    FAMILY HISTORY: History reviewed. No pertinent family history.  SOCIAL HISTORY: Social History   Socioeconomic History   Marital status: Widowed    Spouse name: Not on file   Number of children: 0   Years of education: Not on file   Highest education level: Bachelor's degree (e.g., BA, AB, BS)  Occupational History   Not on file  Tobacco Use   Smoking status: Never    Passive exposure: Never   Smokeless tobacco: Not on file  Vaping Use   Vaping status: Never Used  Substance and Sexual Activity   Alcohol use: No   Drug use: Not Currently   Sexual activity: Not Currently  Other Topics Concern   Not on file  Social History Narrative   Not on file   Social Drivers of Health   Financial Resource Strain: Not on file  Food Insecurity: No Food Insecurity (10/11/2023)   Hunger Vital Sign  Worried About Programme researcher, broadcasting/film/video in the Last Year: Never true    Ran Out of Food in the Last Year: Never true  Transportation Needs: No Transportation Needs (10/11/2023)   PRAPARE - Administrator, Civil Service (Medical): No    Lack of Transportation (Non-Medical): No  Physical Activity: Not on file  Stress: Not on file  Social Connections: Unknown (10/11/2023)   Social Connection and Isolation Panel [NHANES]    Frequency of Communication with Friends and Family: Twice a week    Frequency of Social Gatherings with Friends and Family: Twice a week    Attends Religious Services: 1 to 4 times per year    Active Member of [employer] or Organizations: No    Attends Banker Meetings: Never    Marital Status: Patient unable to answer  Intimate Partner Violence: Not At Risk (10/11/2023)   Humiliation, Afraid, Rape, and Kick questionnaire    Fear of Current or Ex-Partner: No    Emotionally Abused: No    Physically Abused: No    Sexually Abused: No     PHYSICAL EXAM  GENERAL  EXAM/CONSTITUTIONAL: Vitals:  Vitals:   12/17/23 0815 12/17/23 0820  BP: (!) 153/86 (!) 168/99  Pulse: (!) 59 (!) 58  Resp:  15  SpO2: 98% 98%  Weight: 150 lb 2.1 oz (68.1 kg)   Height: 5\' 8"  (1.727 m)    Body mass index is 22.83 kg/m. Wt Readings from Last 3 Encounters:  12/17/23 150 lb 2.1 oz (68.1 kg)  10/15/23 150 lb 2.1 oz (68.1 kg)  10/03/23 141 lb 1.5 oz (64 kg)   Patient is in no distress; well developed, nourished and groomed; neck is supple  MUSCULOSKELETAL: Gait, strength, tone, movements noted in Neurologic exam below  NEUROLOGIC: MENTAL STATUS:      No data to display         awake, alert, oriented to person, month and year but not date Difficulty with recent memory  normal attention and concentration. Able to count the number of quarter in $1.75. Able to spell EMPTY backward language fluent, comprehension intact, naming intact  CRANIAL NERVE:  2nd, 3rd, 4th, 6th - Visual fields full to confrontation, extraocular muscles intact, no nystagmus 5th - facial sensation symmetric 7th - facial strength symmetric 8th - hearing intact 9th - palate elevates symmetrically, uvula midline 11th - shoulder shrug symmetric 12th - tongue protrusion midline  MOTOR:  normal bulk and tone, full strength in the BUE, BLE  SENSORY:  normal and symmetric to light touch  COORDINATION:  finger-nose-finger, fine finger movements normal  GAIT/STATION:  Not tested     DIAGNOSTIC DATA (LABS, IMAGING, TESTING) - I reviewed patient records, labs, notes, testing and imaging myself where available.  Lab Results  Component Value Date   WBC 5.0 10/15/2023   HGB 9.1 (L) 10/15/2023   HCT 26.2 (L) 10/15/2023   MCV 92.3 10/15/2023   PLT 292 10/15/2023      Component Value Date/Time   NA 128 (L) 10/15/2023 0634   K 4.1 10/15/2023 0634   CL 93 (L) 10/15/2023 0634   CO2 28 10/15/2023 0634   GLUCOSE 115 (H) 10/15/2023 0634   BUN 9 10/15/2023 0634   CREATININE 0.60  10/15/2023 0634   CALCIUM 9.7 10/15/2023 0634   PROT 5.6 (L) 10/15/2023 0634   ALBUMIN  2.5 (L) 10/15/2023 0634   AST 16 10/15/2023 0634   ALT 6 10/15/2023 0634   ALKPHOS 105 10/15/2023 4540  BILITOT 0.6 10/15/2023 0634   GFRNONAA >60 10/15/2023 0634   No results found for: "CHOL", "HDL", "LDLCALC", "LDLDIRECT", "TRIG", "CHOLHDL" No results found for: "HGBA1C" Lab Results  Component Value Date   VITAMINB12 1,431 (H) 10/13/2023   Lab Results  Component Value Date   TSH 1.810 09/30/2023    MRI Brain 09/25/2023 1. No acute intracranial abnormality. 2. Mild to moderate for age signal changes of chronic small vessel disease. 3. 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   LTM EEG 09/26/2023 -Continuous slow generalized and lateralized right hemisphere    ASSESSMENT AND PLAN  78 y.o. year old female with history of dementia, bipolar disorder, hypertension, who is presenting after being admitted to the hospital for confusion and altered mental status thought to have a seizure.  Patient is on carbamazepine  extended release but for bipolar disorder.  Since leaving the hospital she has not had any additional event, caretaker tells me that she is doing better, feeling better. She was also found to have low B12 or which she is getting B12 supplementation. Plan will be for patient to continue all medications, no change and to return if she has another episode, at that time we will consider increasing the carbamazepine .   1. Seizure-like activity (HCC)   2. Vitamin B 12 deficiency      Patient Instructions  Continue current medications  Encourage physical therapy  Return as needed   No orders of the defined types were placed in this encounter.   No orders of the defined types were placed in this encounter.   Return if symptoms worsen or fail to improve.   Cassandra Cleveland, MD 12/17/2023, 8:51 AM  [facility] [address], [address] [place], [place] [address] [phone]

## 2024-01-01 ENCOUNTER — Other Ambulatory Visit (INDEPENDENT_AMBULATORY_CARE_PROVIDER_SITE_OTHER): Payer: Self-pay

## 2024-01-01 ENCOUNTER — Encounter: Payer: Self-pay | Admitting: Physician Assistant

## 2024-01-01 ENCOUNTER — Ambulatory Visit (INDEPENDENT_AMBULATORY_CARE_PROVIDER_SITE_OTHER): Admitting: Physician Assistant

## 2024-01-01 DIAGNOSIS — M25552 Pain in left hip: Secondary | ICD-10-CM | POA: Diagnosis not present

## 2024-01-01 NOTE — Progress Notes (Signed)
   Post-Op Visit Note   Patient: Emily Blair           Date of Birth: [birth year]           MRN: [id number] Visit Date: 01/01/2024 PCP: Ardella Beaver, MD   Assessment & Plan:  Chief Complaint:  Chief Complaint  Patient presents with   Left Hip - Follow-up    Left hip IM nailing 10/11/2023   Visit Diagnoses:  1. Pain in left hip     Plan: Patient is a pleasant 78 year old female who comes in today 2 days shy of 12 weeks status post left hip IM nail from intertrochanteric femur fracture, date of surgery 10/11/2023.  She has been doing better.  Minimal pain.  She was ambulating unassisted prior to her fall.  Currently in a wheelchair.  She is living at [facility] skilled nursing facility where insurance has stopped covering physical therapy.  Examination of the left hip reveals painless hip flexion and logroll.  She is neurovascularly intact distally.  This point, I would like for her to continue with formal physical therapy for gait training and mobilization.  I would like her to follow-up in 4 weeks for repeat evaluation and x-rays of the left femur.  At that point, if she is still showing persistent fracture lucency, we will contact [facility] for a bone stimulator.  Follow-Up Instructions: Return in about 4 weeks (around 01/29/2024).   Orders:  Orders Placed This Encounter  Procedures   XR HIP UNILAT W OR W/O PELVIS 2-3 VIEWS LEFT   No orders of the defined types were placed in this encounter.   Imaging: XR HIP UNILAT W OR W/O PELVIS 2-3 VIEWS LEFT Result Date: 01/01/2024 X-rays demonstrate callus formation to the fracture.  However, there is still evidence of persistent fracture lucency.   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  Past Surgical History:  Procedure Laterality Date   FRACTURE SURGERY Right    hip   INTRAMEDULLARY (IM) NAIL INTERTROCHANTERIC Left 10/11/2023   Procedure: FIXATION, FRACTURE, INTERTROCHANTERIC, WITH INTRAMEDULLARY ROD;  Surgeon: Wes Hamman, MD;  Location: [facility] OR;  Service: Orthopedics;  Laterality: Left;   Social History   Occupational History   Not on file  Tobacco Use   Smoking status: Never    Passive exposure: Never   Smokeless tobacco: Not on file  Vaping Use   Vaping status: Never Used  Substance and Sexual Activity   Alcohol use: No   Drug use: Not Currently   Sexual activity: Not Currently

## 2024-02-02 ENCOUNTER — Other Ambulatory Visit (INDEPENDENT_AMBULATORY_CARE_PROVIDER_SITE_OTHER): Payer: Self-pay

## 2024-02-02 ENCOUNTER — Ambulatory Visit (INDEPENDENT_AMBULATORY_CARE_PROVIDER_SITE_OTHER): Admitting: Physician Assistant

## 2024-02-02 DIAGNOSIS — M25552 Pain in left hip: Secondary | ICD-10-CM | POA: Diagnosis not present

## 2024-02-02 NOTE — Progress Notes (Signed)
   Post-Op Visit Note   Patient: Emily Blair           Date of Birth: [birth year]           MRN: [id number] Visit Date: 02/02/2024 PCP: Marelyn Quill, MD   Assessment & Plan:  Chief Complaint:  Chief Complaint  Patient presents with   Left Leg - Follow-up   Visit Diagnoses:  1. Pain in left hip     Plan: Patient is a pleasant 78 year old female who comes in today 4 months status post left hip IM nail from intertrochanteric femur fracture.  She has been doing better.  She is in a wheelchair today but does ambulate with a walker at the nursing facility.  It sounds like she may be there long-term.  She tells me that her pain has improved quite a bit.  Overall, feeling well.  Examination of the left hip reveals painless hip flexion.  She is neurovascularly intact distally.  At this point, she will continue with physical therapy.   she will follow-up in 2 months for repeat evaluation and x-rays of the left femur.  Call with concerns or questions.  Follow-Up Instructions: Return in about 2 months (around 04/04/2024).   Orders:  Orders Placed This Encounter  Procedures   XR FEMUR MIN 2 VIEWS LEFT   No orders of the defined types were placed in this encounter.   Imaging: XR FEMUR MIN 2 VIEWS LEFT Result Date: 02/02/2024 X-rays demonstrate consolidation of fracture site.  No hardware complication.   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  Past Surgical History:  Procedure Laterality Date   FRACTURE SURGERY Right    hip   INTRAMEDULLARY (IM) NAIL INTERTROCHANTERIC Left 10/11/2023   Procedure: FIXATION, FRACTURE, INTERTROCHANTERIC, WITH INTRAMEDULLARY ROD;  Surgeon: Jerri Kay HERO, MD;  Location: [facility] OR;  Service: Orthopedics;  Laterality: Left;   Social History   Occupational History   Not on file  Tobacco Use   Smoking status: Never    Passive exposure: Never   Smokeless tobacco: Not on file  Vaping Use   Vaping status: Never Used  Substance and Sexual Activity   Alcohol use: No   Drug use: Not Currently   Sexual activity: Not Currently

## 2024-04-05 ENCOUNTER — Encounter: Payer: Self-pay | Admitting: Physician Assistant

## 2024-04-05 ENCOUNTER — Other Ambulatory Visit (INDEPENDENT_AMBULATORY_CARE_PROVIDER_SITE_OTHER): Payer: Self-pay

## 2024-04-05 ENCOUNTER — Ambulatory Visit (INDEPENDENT_AMBULATORY_CARE_PROVIDER_SITE_OTHER): Admitting: Physician Assistant

## 2024-04-05 DIAGNOSIS — M25552 Pain in left hip: Secondary | ICD-10-CM

## 2024-04-05 DIAGNOSIS — S72142A Displaced intertrochanteric fracture of left femur, initial encounter for closed fracture: Secondary | ICD-10-CM

## 2024-04-05 NOTE — Progress Notes (Signed)
   Post-Op Visit Note   Patient: Emily Blair           Date of Birth: [birth year]           MRN: [id number] Visit Date: 04/05/2024 PCP: Marelyn Quill, MD   Assessment & Plan:  Chief Complaint:  Chief Complaint  Patient presents with   Left Hip - Pain    Hip IM nailing 10/11/2023   Visit Diagnoses:  1. Pain in left hip   2. Displaced intertrochanteric fracture of left femur, initial encounter for closed fracture [facility])     Plan: Patient is a pleasant 78 year old female who comes in today 6 months status post left hip IM nail from an intertrochanteric femur fracture, date of surgery 10/11/2023.  She is doing much better.  No pain.  She still has apprehension with ambulating so this primarily just getting around at the facility in her wheelchair but is standing for transfers.  Examination of the left hip reveals painless hip flexion and logroll.  She is neurovascularly intact distally.  At this point, her fracture has healed.  She will continue to advance with activity as tolerated.  Follow-up with us  as needed.  Call with concerns or questions.  Follow-Up Instructions: Return if symptoms worsen or fail to improve.   Orders:  Orders Placed This Encounter  Procedures   XR FEMUR MIN 2 VIEWS LEFT   No orders of the defined types were placed in this encounter.   Imaging: XR FEMUR MIN 2 VIEWS LEFT Result Date: 04/05/2024 X-rays demonstrate consolidation of the fracture site.  No hardware complication.   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  Past Surgical History:  Procedure Laterality Date   FRACTURE SURGERY Right    hip   INTRAMEDULLARY (IM) NAIL INTERTROCHANTERIC Left 10/11/2023   Procedure: FIXATION, FRACTURE, INTERTROCHANTERIC, WITH INTRAMEDULLARY ROD;  Surgeon: Jerri Kay HERO, MD;  Location: [facility] OR;  Service: Orthopedics;  Laterality: Left;   Social History   Occupational History   Not on file  Tobacco Use   Smoking status: Never    Passive exposure: Never   Smokeless tobacco: Not on file  Vaping Use   Vaping status: Never Used  Substance and Sexual Activity   Alcohol use: No   Drug use: Not Currently   Sexual activity: Not Currently

## 2024-04-11 NOTE — Progress Notes (Signed)
 Attempted to contact patient to schedule f/u appt. I was transferred to nurse and phone rung through until call ended. I will try again today.

## 2024-04-14 ENCOUNTER — Ambulatory Visit: Admitting: Physician Assistant

## 2024-04-14 DIAGNOSIS — M810 Age-related osteoporosis without current pathological fracture: Secondary | ICD-10-CM | POA: Diagnosis not present

## 2024-04-14 NOTE — Progress Notes (Signed)
 Office Visit Note   Patient: Emily Blair           Date of Birth: 26-Feb-1946           MRN: [id number] Visit Date: 04/14/2024              Requested by: Marelyn Quill, MD [address] [place],  [place] [address] PCP: Marelyn Quill, MD      HPI: Patient comes in today for follow-up on osteoporosis clinic she is had 2 hip fractures and a humerus fracture and vertebral fractures.  Her bone density scan is not due until February 2026.  She is currently out of facility because of her hips.  She does walk but did quite a bit of walking prior to her fractures.  She said she is afraid to walk because she does not want to fall and break again  Assessment & Plan: Visit Diagnoses:  1. Age-related osteoporosis without current pathological fracture     Plan: I do think given her high risk she is very compliant with questions today she would be interested in trying Prolia went over the side effects and the administration of the medication.  Will try for authorization.  Risks of the medication including atypical femur fracture osteonecrosis of the jaw myalgias were discussed with her she will need to be followed with calcium and vitamin D .  Currently her calcium and vitamin D  are adequate.  Follow-Up Instructions: Return if symptoms worsen or fail to improve.     Patient is alert, oriented, no adenopathy, well-dressed, normal affect, normal respiratory effort.     Imaging: No results found. No images are attached to the encounter.  Labs: Lab Results  Component Value Date   REPTSTATUS 10/06/2023 FINAL 10/01/2023   REPTSTATUS 10/06/2023 FINAL 10/01/2023   GRAMSTAIN NO WBC SEEN NO ORGANISMS SEEN CYTOSPIN SMEAR  09/25/2023   CULT  10/01/2023    NO GROWTH 5 DAYS Performed at [facility], [address], [place], [place] [address]    CULT  10/01/2023    NO GROWTH 5 DAYS Performed at [facility], [address], [place], [place] [address]      Lab Results  Component  Value Date   ALBUMIN  2.5 (L) 10/15/2023   ALBUMIN  2.8 (L) 10/03/2023   ALBUMIN  2.8 (L) 10/02/2023    Lab Results  Component Value Date   MG 1.6 (L) 10/11/2023   MG 2.1 10/02/2023   MG 2.3 09/30/2023   Lab Results  Component Value Date   VD25OH 32.31 10/11/2023    No results found for: PREALBUMIN    Latest Ref Rng & Units 10/15/2023    6:34 AM 10/14/2023    5:57 AM 10/13/2023    6:19 AM  CBC EXTENDED  WBC 4.0 - 10.5 K/uL 5.0  4.9  4.4   RBC 3.87 - 5.11 MIL/uL 2.84  2.17  2.37    2.36   Hemoglobin 12.0 - 15.0 g/dL 9.1  7.0  7.8   HCT 63.9 - 46.0 % 26.2  20.6  22.7   Platelets 150 - 400 K/uL 292  255  275   NEUT# 1.7 - 7.7 K/uL 2.8     Lymph# 0.7 - 4.0 K/uL 1.5        There is no height or weight on file to calculate BMI.  Orders:  No orders of the defined types were placed in this encounter.  No orders of the defined types were placed in this  encounter.    Procedures: No procedures performed  Clinical Data: No additional findings.  ROS:  All other systems negative, except as noted in the HPI. Review of Systems  Objective: Vital Signs: There were no vitals taken for this visit.  Specialty Comments:  No specialty comments available.  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  Past Surgical History:  Procedure Laterality Date   FRACTURE SURGERY Right    hip   INTRAMEDULLARY (IM) NAIL INTERTROCHANTERIC Left 10/11/2023   Procedure: FIXATION, FRACTURE, INTERTROCHANTERIC, WITH INTRAMEDULLARY ROD;  Surgeon: Jerri Kay HERO, MD;  Location: [facility] OR;  Service: Orthopedics;  Laterality: Left;   Social History   Occupational History   Not on file  Tobacco Use   Smoking status: Never    Passive exposure: Never   Smokeless tobacco: Not on file  Vaping Use   Vaping status: Never Used  Substance and Sexual Activity   Alcohol use: No   Drug use: Not Currently   Sexual activity: Not Currently

## 2024-04-18 ENCOUNTER — Telehealth: Payer: Self-pay

## 2024-04-18 DIAGNOSIS — M810 Age-related osteoporosis without current pathological fracture: Secondary | ICD-10-CM

## 2024-04-18 NOTE — Telephone Encounter (Signed)
 SABRA

## 2024-04-18 NOTE — Telephone Encounter (Signed)
 [facility] and spoke to Mariposa and need to send a written request for the bone density scan to [phone].

## 2024-04-19 ENCOUNTER — Other Ambulatory Visit: Payer: Self-pay | Admitting: Physician Assistant

## 2024-04-19 DIAGNOSIS — M810 Age-related osteoporosis without current pathological fracture: Secondary | ICD-10-CM

## 2024-04-19 MED ORDER — DENOSUMAB 60 MG/ML ~~LOC~~ SOSY: 60.0000 mg | PREFILLED_SYRINGE | Freq: Once | SUBCUTANEOUS | Status: AC

## 2024-05-23 ENCOUNTER — Encounter: Payer: Self-pay | Admitting: Radiology

## 2024-06-08 ENCOUNTER — Encounter: Payer: Self-pay | Admitting: Radiology
